# Patient Record
Sex: Female | Born: 1955 | Race: White | Hispanic: No | Marital: Married | Smoking: Former smoker
Health system: Southern US, Community
[De-identification: ages and names within clinical notes are randomized; demographics above are authoritative.]

## PROBLEM LIST (undated history)

## (undated) DIAGNOSIS — C439 Malignant melanoma of skin, unspecified: Secondary | ICD-10-CM

## (undated) DIAGNOSIS — R0600 Dyspnea, unspecified: Secondary | ICD-10-CM

## (undated) DIAGNOSIS — F32A Depression, unspecified: Secondary | ICD-10-CM

## (undated) DIAGNOSIS — L570 Actinic keratosis: Secondary | ICD-10-CM

## (undated) DIAGNOSIS — C4491 Basal cell carcinoma of skin, unspecified: Secondary | ICD-10-CM

## (undated) DIAGNOSIS — M549 Dorsalgia, unspecified: Secondary | ICD-10-CM

## (undated) DIAGNOSIS — M1990 Unspecified osteoarthritis, unspecified site: Secondary | ICD-10-CM

## (undated) DIAGNOSIS — Z9289 Personal history of other medical treatment: Secondary | ICD-10-CM

## (undated) DIAGNOSIS — N390 Urinary tract infection, site not specified: Secondary | ICD-10-CM

## (undated) DIAGNOSIS — I700 Atherosclerosis of aorta: Secondary | ICD-10-CM

## (undated) DIAGNOSIS — R0609 Other forms of dyspnea: Secondary | ICD-10-CM

## (undated) DIAGNOSIS — E785 Hyperlipidemia, unspecified: Secondary | ICD-10-CM

## (undated) DIAGNOSIS — M359 Systemic involvement of connective tissue, unspecified: Secondary | ICD-10-CM

## (undated) DIAGNOSIS — C4492 Squamous cell carcinoma of skin, unspecified: Secondary | ICD-10-CM

## (undated) DIAGNOSIS — I10 Essential (primary) hypertension: Secondary | ICD-10-CM

## (undated) DIAGNOSIS — F329 Major depressive disorder, single episode, unspecified: Secondary | ICD-10-CM

## (undated) HISTORY — DX: Basal cell carcinoma of skin, unspecified: C44.91

## (undated) HISTORY — PX: OOPHORECTOMY: SHX86

## (undated) HISTORY — DX: Squamous cell carcinoma of skin, unspecified: C44.92

## (undated) HISTORY — DX: Other forms of dyspnea: R06.09

## (undated) HISTORY — PX: ABDOMINAL HYSTERECTOMY: SHX81

## (undated) HISTORY — PX: INCONTINENCE SURGERY: SHX676

## (undated) HISTORY — DX: Dyspnea, unspecified: R06.00

## (undated) HISTORY — DX: Atherosclerosis of aorta: I70.0

## (undated) HISTORY — DX: Personal history of other medical treatment: Z92.89

## (undated) HISTORY — DX: Actinic keratosis: L57.0

## (undated) HISTORY — DX: Dorsalgia, unspecified: M54.9

## (undated) HISTORY — DX: Malignant melanoma of skin, unspecified: C43.9

---

## 2016-11-09 DIAGNOSIS — G5601 Carpal tunnel syndrome, right upper limb: Secondary | ICD-10-CM | POA: Insufficient documentation

## 2016-11-22 ENCOUNTER — Emergency Department
Admission: EM | Admit: 2016-11-22 | Discharge: 2016-11-22 | Disposition: A | Discharge status: Home / Self Care | Payer: BLUE CROSS/BLUE SHIELD | Attending: Emergency Medicine | Admitting: Emergency Medicine

## 2016-11-22 DIAGNOSIS — N3090 Cystitis, unspecified without hematuria: Secondary | ICD-10-CM | POA: Diagnosis not present

## 2016-11-22 DIAGNOSIS — Z87891 Personal history of nicotine dependence: Secondary | ICD-10-CM | POA: Insufficient documentation

## 2016-11-22 DIAGNOSIS — N10 Acute tubulo-interstitial nephritis: Secondary | ICD-10-CM | POA: Insufficient documentation

## 2016-11-22 DIAGNOSIS — I10 Essential (primary) hypertension: Secondary | ICD-10-CM | POA: Insufficient documentation

## 2016-11-22 DIAGNOSIS — R319 Hematuria, unspecified: Secondary | ICD-10-CM | POA: Diagnosis present

## 2016-11-22 HISTORY — DX: Unspecified osteoarthritis, unspecified site: M19.90

## 2016-11-22 HISTORY — DX: Major depressive disorder, single episode, unspecified: F32.9

## 2016-11-22 HISTORY — DX: Hyperlipidemia, unspecified: E78.5

## 2016-11-22 HISTORY — DX: Essential (primary) hypertension: I10

## 2016-11-22 HISTORY — DX: Urinary tract infection, site not specified: N39.0

## 2016-11-22 HISTORY — DX: Depression, unspecified: F32.A

## 2016-11-22 LAB — URINALYSIS COMPLETE WITH MICROSCOPIC (ARMC ONLY)
Bacteria, UA: NONE SEEN
Bilirubin Urine: NEGATIVE
Glucose, UA: NEGATIVE mg/dL
KETONES UR: NEGATIVE mg/dL
Nitrite: NEGATIVE
PH: 6 (ref 5.0–8.0)
PROTEIN: 100 mg/dL — AB
SQUAMOUS EPITHELIAL / LPF: NONE SEEN
Specific Gravity, Urine: 1.016 (ref 1.005–1.030)

## 2016-11-22 MED ORDER — CIPROFLOXACIN HCL 500 MG PO TABS: 500.0000 mg | ORAL_TABLET | Freq: Two times a day (BID) | ORAL | 0 refills | Status: AC

## 2016-11-22 MED ORDER — CEPHALEXIN 500 MG PO CAPS
500.0000 mg | ORAL_CAPSULE | Freq: Once | ORAL | Status: AC
  Administered 2016-11-22: 500 mg via ORAL
  Filled 2016-11-22: qty 1

## 2016-11-22 NOTE — ED Triage Notes (Signed)
Pt presents to ED with blood in her urine today. Pt states she was expereincing urinary symptoms that started last Thursday; symptoms seemed to resolve after taking AZO. Pt states today she noticed blood in her urine.

## 2016-11-22 NOTE — Discharge Instructions (Signed)
Take the antibiotic as directed until all pills are gone. Empty your bladder regularly. Follow-up with your provider for continued symptoms or return as needed.

## 2016-11-22 NOTE — ED Provider Notes (Signed)
[facility] Emergency Department Provider Note ____________________________________________  Time seen: 2111  I have reviewed the triage vital signs and the nursing notes.  HISTORY  Chief Complaint  Hematuria  HPI [name] is a 60 y.o. female presents to the ED for evaluation of hematuria and dysuria. Patient describes symptoms of an intermittent for the last week. She has taken Azo on at least 2 occasions with symptom resolution. It was today the patient noted bright red blood on the toilet paper after voiding. She denies any current nausea, vomiting, or chills. She did have a day last week where she experienced those symptoms. Denies any history of kidney stones, but reports urinary tract infections on the order of 2 per year.  Past Medical History:  Diagnosis Date  . Arthritis   . Depression   . Hyperlipemia   . Hypertension   . Recurrent UTI     There are no active problems to display for this patient.   Past Surgical History:  Procedure Laterality Date  . ABDOMINAL HYSTERECTOMY      Prior to Admission medications   Medication Sig Start Date End Date Taking? Authorizing Provider  ciprofloxacin (CIPRO) 500 MG tablet Take 1 tablet (500 mg total) by mouth 2 (two) times daily. 11/22/16 11/29/16  Dannielle Karvonen Rocky Rishel, PA-C    Allergies Patient has no known allergies.  No family history on file.  Social History Social History  Substance Use Topics  . Smoking status: Former Research scientist (life sciences)  . Smokeless tobacco: Never Used  . Alcohol use No    Review of Systems  Constitutional: Negative for fever. Cardiovascular: Negative for chest pain. Respiratory: Negative for shortness of breath. Gastrointestinal: Negative for abdominal pain, vomiting and diarrhea. Genitourinary: Positive for dysuria and hematuria Musculoskeletal: Negative for back pain. Skin: Negative for rash. Neurological: Negative for headaches, focal weakness or  numbness. ____________________________________________  PHYSICAL EXAM:  VITAL SIGNS: ED Triage Vitals  Enc Vitals Group     BP 11/22/16 2104 (!) 149/82     Pulse Rate 11/22/16 2104 66     Resp 11/22/16 2104 16     Temp 11/22/16 2104 97.9 F (36.6 C)     Temp Source 11/22/16 2104 Oral     SpO2 11/22/16 2104 97 %     Weight 11/22/16 2100 180 lb (81.6 kg)     Height 11/22/16 2100 5\' 6"  (1.676 m)     Head Circumference --      Peak Flow --      Pain Score 11/22/16 2100 0     Pain Loc --      Pain Edu? --      Excl. in [place]? --    Constitutional: Alert and oriented. Well appearing and in no distress. Head: Normocephalic and atraumatic. Cardiovascular: Normal rate, regular rhythm. Normal distal pulses. Respiratory: Normal respiratory effort. No wheezes/rales/rhonchi. Gastrointestinal: Soft and nontender. No distention, rebound, guarding, or rigidity. Mild CVA tenderness on the left.  Musculoskeletal: Nontender with normal range of motion in all extremities.  Neurologic:  Normal gait without ataxia. Normal speech and language. No gross focal neurologic deficits are appreciated. Skin:  Skin is warm, dry and intact. No rash noted. ____________________________________________   LABS (pertinent positives/negatives) Labs Reviewed  URINALYSIS COMPLETEWITH MICROSCOPIC ([facility] ONLY) - Abnormal; Notable for the following:       Result Value   Color, Urine YELLOW (*)    APPearance CLOUDY (*)    Hgb urine dipstick 3+ (*)    Protein,  ur 100 (*)    Leukocytes, UA 3+ (*)    All other components within normal limits  URINE CULTURE  ____________________________________________  PROCEDURES  Keflex 500 mg PO ____________________________________________  INITIAL IMPRESSION / ASSESSMENT AND PLAN / ED COURSE  Patient with clinical presentation of acute cystitis. Her urinalysis reveals white blood cell clumps consistent with a uncomplicated pyelonephritis. Patient vital signs are stable and she  is appropriate for outpatient treatment. She'll be discharged with a prescription for Cipro dose as directed for the next 7 days. She'll follow with Dr. Gorden Harms for ongoing symptom management return to the ED for acutely worsening symptoms as discussed.  Clinical Course    ____________________________________________  FINAL CLINICAL IMPRESSION(S) / ED DIAGNOSES  Final diagnoses:  Cystitis  Pyelonephritis, acute      [name], PA-C 11/22/16 2228    Nance Pear, MD 11/22/16 2336

## 2016-11-25 LAB — URINE CULTURE: SPECIAL REQUESTS: NORMAL

## 2016-11-26 NOTE — Progress Notes (Signed)
ED Culture Results   Allergies: NKDA  Visit Date: 11/22/16 Chief Complaint: hematuria Culture Type: Urine  Culture Results: E. coli Original Abx given: Ciprofloxacin  Original Abx sensitive, intermediate, or resistant: sensitive  Patient on appropriate antibiotic.   Loree Fee, PharmD 7:55 PM 11/26/2016

## 2017-08-02 ENCOUNTER — Other Ambulatory Visit: Payer: Self-pay | Admitting: Family Medicine

## 2017-08-02 DIAGNOSIS — Z1231 Encounter for screening mammogram for malignant neoplasm of breast: Secondary | ICD-10-CM

## 2017-08-18 ENCOUNTER — Ambulatory Visit
Admission: RE | Admit: 2017-08-18 | Discharge: 2017-08-18 | Disposition: A | Discharge status: Home / Self Care | Payer: BLUE CROSS/BLUE SHIELD | Source: Ambulatory Visit | Attending: Family Medicine | Admitting: Family Medicine

## 2017-08-18 DIAGNOSIS — Z1231 Encounter for screening mammogram for malignant neoplasm of breast: Secondary | ICD-10-CM | POA: Diagnosis present

## 2017-08-18 DIAGNOSIS — R928 Other abnormal and inconclusive findings on diagnostic imaging of breast: Secondary | ICD-10-CM | POA: Insufficient documentation

## 2017-09-06 ENCOUNTER — Inpatient Hospital Stay
Admission: RE | Admit: 2017-09-06 | Discharge: 2017-09-06 | Disposition: A | Discharge status: Home / Self Care | Payer: Self-pay | Source: Ambulatory Visit | Attending: *Deleted | Admitting: *Deleted

## 2017-09-06 ENCOUNTER — Other Ambulatory Visit: Payer: Self-pay | Admitting: *Deleted

## 2017-09-06 DIAGNOSIS — Z9289 Personal history of other medical treatment: Secondary | ICD-10-CM

## 2017-09-07 ENCOUNTER — Other Ambulatory Visit: Payer: Self-pay | Admitting: Family Medicine

## 2017-09-07 DIAGNOSIS — R928 Other abnormal and inconclusive findings on diagnostic imaging of breast: Secondary | ICD-10-CM

## 2017-09-07 DIAGNOSIS — N6489 Other specified disorders of breast: Secondary | ICD-10-CM

## 2017-09-14 ENCOUNTER — Ambulatory Visit
Admission: RE | Admit: 2017-09-14 | Discharge: 2017-09-14 | Disposition: A | Discharge status: Home / Self Care | Payer: BLUE CROSS/BLUE SHIELD | Source: Ambulatory Visit | Attending: Family Medicine | Admitting: Family Medicine

## 2017-09-14 DIAGNOSIS — N6489 Other specified disorders of breast: Secondary | ICD-10-CM | POA: Diagnosis present

## 2017-09-14 DIAGNOSIS — R928 Other abnormal and inconclusive findings on diagnostic imaging of breast: Secondary | ICD-10-CM | POA: Diagnosis present

## 2018-06-12 ENCOUNTER — Encounter: Payer: Self-pay | Admitting: Urology

## 2018-06-12 ENCOUNTER — Ambulatory Visit: Payer: BLUE CROSS/BLUE SHIELD | Admitting: Urology

## 2018-06-12 VITALS — BP 152/75 | HR 65 | Ht 66.0 in | Wt 188.0 lb

## 2018-06-12 DIAGNOSIS — R3129 Other microscopic hematuria: Secondary | ICD-10-CM

## 2018-06-12 MED ORDER — CIPROFLOXACIN HCL 500 MG PO TABS: 500.0000 mg | ORAL_TABLET | Freq: Two times a day (BID) | ORAL | 0 refills | Status: DC

## 2018-06-12 NOTE — Progress Notes (Unsigned)
06/12/2018 3:51 PM   Michelle Cisneros [birth year] [id number]  Referring provider: Maryland Pink, MD [address] [place], [place] [address]  Chief Complaint  Patient presents with  . Urinary Frequency    HPI: Was consulted to assess the patient recurrent bladder infections.  She said 3 since January.  She gets dysuria and frequency and sometimes blood in the urine.  She may have had a kidney infection in the past.  The symptoms respond favorably to antibiotics.  She has had a previous sling.  She has a smoking history.  She does not take daily aspirin or blood thinners.  She has had a mesh sling  At baseline she leaks with coughing sneezing bending and lifting.  She has urge incontinence.  She denies bedwetting.  She wears 4 5 pads a day sometimes damp but sometimes very wet especially if walking or more active.  She voids once at night or less and every 2 hours during the day  She has had a hysterectomy.  She has no neurologic issues.  Bowel function is normal.  She has not had a kidney stone.  Modifying factors: There are no other modifying factors  Associated signs and symptoms: There are no other associated signs and symptoms Aggravating and relieving factors: There are no other aggravating or relieving factors Severity: Moderate Duration: Persistent   PMH: Past Medical History:  Diagnosis Date  . Arthritis   . Depression   . Hyperlipemia   . Hypertension   . Recurrent UTI     Surgical History: Past Surgical History:  Procedure Laterality Date  . ABDOMINAL HYSTERECTOMY      Home Medications:  Allergies as of 06/12/2018   No Known Allergies     Medication List        Accurate as of 06/12/18  3:51 PM. Always use your most recent med list.          atorvastatin 10 MG tablet Commonly known as:  LIPITOR TAKE ONE TABLET BY MOUTH DAILY   buPROPion 300 MG 24 hr tablet Commonly known as:  WELLBUTRIN XL TAKE ONE TABLET BY MOUTH DAILY   Calcium Carbonate-Vitamin D 600-400 MG-UNIT tablet Take by mouth.   lisinopril 10 MG tablet Commonly known as:  PRINIVIL,ZESTRIL Take by mouth.       Allergies: No Known Allergies  Family History: Family History  Problem Relation Age of Onset  . Breast cancer Neg Hx   . Bladder Cancer Neg Hx   . Kidney cancer Neg Hx     Social History:  reports that she has quit smoking. She has never used smokeless tobacco. She reports that she does not drink alcohol. Her drug history is not on file.  ROS: UROLOGY Frequent Urination?: Yes Hard to postpone urination?: Yes Burning/pain with urination?: No Get up at night to urinate?: Yes Leakage of urine?: Yes Urine stream starts and stops?: No Trouble starting stream?: No Do you have to strain to urinate?: No Blood in urine?: Yes Urinary tract infection?: Yes Sexually transmitted disease?: No Injury to kidneys or bladder?: No Painful intercourse?: No Weak stream?: No Currently pregnant?: No Vaginal bleeding?: No Last menstrual period?: n  Gastrointestinal Nausea?: No Vomiting?: No Indigestion/heartburn?: No Diarrhea?: No Constipation?: No  Constitutional Fever: No Night sweats?: No Weight loss?: No Fatigue?: No  Skin Skin rash/lesions?: No Itching?: No  Eyes Blurred vision?: No Double vision?: No  Ears/Nose/Throat Sore throat?: No Sinus problems?: No  Hematologic/Lymphatic Swollen glands?: No Easy bruising?:  No  Cardiovascular Leg swelling?: No Chest pain?: No  Respiratory Cough?: No Shortness of breath?: No  Endocrine Excessive thirst?: No  Musculoskeletal Back pain?: Yes Joint pain?: Yes  Neurological Headaches?: No Dizziness?: No  Psychologic Depression?: Yes Anxiety?: No  Physical Exam: BP (!) 152/75   Pulse 65   Ht 5\' 6"  (1.676 m)   Wt 188 lb (85.3 kg)   BMI 30.34 kg/m   Constitutional:  Alert and oriented, No acute distress. HEENT: [place] AT, moist mucus membranes.  Trachea  midline, no masses. Cardiovascular: No clubbing, cyanosis, or edema. Respiratory: Normal respiratory effort, no increased work of breathing. GI: Abdomen is soft, nontender, nondistended, no abdominal masses GU: No bladder or CVA tenderness Skin: No rashes, bruises or suspicious lesions. Lymph: No cervical or inguinal adenopathy. Neurologic: Grossly intact, no focal deficits, moving all 4 extremities. Psychiatric: Normal mood and affect.  Laboratory Data: No results found for: WBC, HGB, HCT, MCV, PLT  No results found for: CREATININE  No results found for: PSA  No results found for: TESTOSTERONE  No results found for: HGBA1C  Urinalysis    Component Value Date/Time   COLORURINE YELLOW (A) 11/22/2016 2101   APPEARANCEUR CLOUDY (A) 11/22/2016 2101   LABSPEC 1.016 11/22/2016 2101   PHURINE 6.0 11/22/2016 2101   GLUCOSEU NEGATIVE 11/22/2016 2101   HGBUR 3+ (A) 11/22/2016 2101   BILIRUBINUR NEGATIVE 11/22/2016 2101   [place] NEGATIVE 11/22/2016 2101   PROTEINUR 100 (A) 11/22/2016 2101   NITRITE NEGATIVE 11/22/2016 2101   LEUKOCYTESUR 3+ (A) 11/22/2016 2101    Pertinent Imaging: No x-ray  Assessment & Plan: The patient was referred for recurrent urinary tract infections and what appears to be microscopic hematuria.  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  She does have a smoking history.  Pathophysiology and work-up of chronic cystitis and hematuria discussed.  I mentioned that in the future if we can stop her infections her incontinence may down regulate and we can set up goals then.  Today she had bacteria and microscopic hematuria and urine sent for culture.  Return with CT scan and for pelvic examination and cystoscopy.  Likely start prophylaxis then.  She was called in ciprofloxacin since she thinks she is infected today  1. Microscopic hematuria 2.  Mixed urinary incontinence - Urinalysis, Complete   No follow-ups on file.  Reece Packer,  MD  [facility] [address] [place], [place] [address] [phone]

## 2018-06-13 LAB — MICROSCOPIC EXAMINATION

## 2018-06-13 LAB — URINALYSIS, COMPLETE
BILIRUBIN UA: NEGATIVE
GLUCOSE, UA: NEGATIVE
KETONES UA: NEGATIVE
Leukocytes, UA: NEGATIVE
Nitrite, UA: NEGATIVE
PROTEIN UA: NEGATIVE
SPEC GRAV UA: 1.02 (ref 1.005–1.030)
Urobilinogen, Ur: 0.2 mg/dL (ref 0.2–1.0)
pH, UA: 6 (ref 5.0–7.5)

## 2018-06-14 LAB — CULTURE, URINE COMPREHENSIVE

## 2018-07-11 ENCOUNTER — Ambulatory Visit
Admission: RE | Admit: 2018-07-11 | Discharge: 2018-07-11 | Disposition: A | Discharge status: Home / Self Care | Payer: BLUE CROSS/BLUE SHIELD | Source: Ambulatory Visit | Attending: Urology | Admitting: Urology

## 2018-07-11 DIAGNOSIS — R3129 Other microscopic hematuria: Secondary | ICD-10-CM | POA: Insufficient documentation

## 2018-07-11 HISTORY — DX: Systemic involvement of connective tissue, unspecified: M35.9

## 2018-07-11 LAB — POCT I-STAT CREATININE: Creatinine, Ser: 0.7 mg/dL (ref 0.44–1.00)

## 2018-07-11 MED ORDER — IOPAMIDOL (ISOVUE-300) INJECTION 61%
125.0000 mL | Freq: Once | INTRAVENOUS | Status: AC | PRN
  Administered 2018-07-11: 125 mL via INTRAVENOUS

## 2018-07-17 ENCOUNTER — Ambulatory Visit: Payer: BLUE CROSS/BLUE SHIELD | Admitting: Urology

## 2018-07-17 ENCOUNTER — Encounter: Payer: Self-pay | Admitting: Urology

## 2018-07-17 VITALS — BP 156/90 | HR 65 | Ht 66.0 in | Wt 188.0 lb

## 2018-07-17 DIAGNOSIS — N3946 Mixed incontinence: Secondary | ICD-10-CM

## 2018-07-17 DIAGNOSIS — R3129 Other microscopic hematuria: Secondary | ICD-10-CM

## 2018-07-17 LAB — MICROSCOPIC EXAMINATION
Epithelial Cells (non renal): NONE SEEN /hpf (ref 0–10)
WBC, UA: NONE SEEN /hpf (ref 0–5)

## 2018-07-17 LAB — URINALYSIS, COMPLETE
BILIRUBIN UA: NEGATIVE
GLUCOSE, UA: NEGATIVE
KETONES UA: NEGATIVE
Leukocytes, UA: NEGATIVE
NITRITE UA: NEGATIVE
Protein, UA: NEGATIVE
SPEC GRAV UA: 1.02 (ref 1.005–1.030)
UUROB: 0.2 mg/dL (ref 0.2–1.0)
pH, UA: 7 (ref 5.0–7.5)

## 2018-07-17 MED ORDER — LIDOCAINE HCL URETHRAL/MUCOSAL 2 % EX GEL: 1.0000 "application " | Freq: Once | CUTANEOUS | Status: DC

## 2018-07-17 MED ORDER — TRIMETHOPRIM 100 MG PO TABS: 100.0000 mg | ORAL_TABLET | Freq: Every day | ORAL | 11 refills | Status: DC

## 2018-07-17 MED ORDER — CIPROFLOXACIN HCL 500 MG PO TABS: 500.0000 mg | ORAL_TABLET | Freq: Once | ORAL | Status: DC

## 2018-07-17 NOTE — Progress Notes (Signed)
07/17/2018 9:58 AM   Michelle Cisneros 26-Sep-1956 [id number]  Referring provider: Maryland Pink, MD [address] [place], [place] [address]  Chief Complaint  Patient presents with  . Cysto    HPI: Was consulted to assess the patient recurrent bladder infections.  She said 3 since January.  She gets dysuria and frequency and sometimes blood in the urine.  She may have had a kidney infection in the past.  The symptoms respond favorably to antibiotics.  She has had a previous mesh sling.  She has a smoking history.  She does not take daily aspirin or blood thinners.    At baseline she leaks with coughing sneezing bending and lifting.  She has urge incontinence.  She denies bedwetting.  She wears 4- 5 pads a day sometimes damp but sometimes very wet especially if walking or more active.  She voids once at night or less and every 2 hours during the day   The patient was referred for recurrent urinary tract infections and what appears to be microscopic hematuria.  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  She does have a smoking history.  Pathophysiology and work-up of chronic cystitis and hematuria discussed.  I mentioned that in the future if we can stop her infections her incontinence may down regulate and we can set up goals then.    Today Frequency stable.  Last urine culture negative.  CT scan within normal limits.  She might have a mild right ureteropelvic junction issue or just an extrarenal pelvis.  Contrast excretion and uptake was similar in both kidneys  Clinically not infected today.  No more pain with urination  Pelvic examination: Small grade 2 cystocele with mild central defect and no rectocele.  Grade 1 hypermobility of the bladder neck and no stress incontinence with a light cough.  Moderate vaginal atrophy  Cystoscopy: After verbal and written consent patient underwent sterile cystoscopy.  Flexible cystoscope was utilized.  Bladder  mucosa and trigone were normal.  There is no cystitis.  There is no carcinoma.  Urethra was normal     PMH: Past Medical History:  Diagnosis Date  . Arthritis   . Collagen vascular disease ([place])   . Depression   . Hyperlipemia   . Hypertension   . Recurrent UTI     Surgical History: Past Surgical History:  Procedure Laterality Date  . ABDOMINAL HYSTERECTOMY      Home Medications:  Allergies as of 07/17/2018   No Known Allergies     Medication List        Accurate as of 07/17/18  9:58 AM. Always use your most recent med list.          atorvastatin 10 MG tablet Commonly known as:  LIPITOR TAKE ONE TABLET BY MOUTH DAILY   buPROPion 300 MG 24 hr tablet Commonly known as:  WELLBUTRIN XL TAKE ONE TABLET BY MOUTH DAILY   Calcium Carbonate-Vitamin D 600-400 MG-UNIT tablet Take by mouth.   lisinopril 10 MG tablet Commonly known as:  PRINIVIL,ZESTRIL Take by mouth.       Allergies: No Known Allergies  Family History: Family History  Problem Relation Age of Onset  . Breast cancer Neg Hx   . Bladder Cancer Neg Hx   . Kidney cancer Neg Hx     Social History:  reports that she has quit smoking. She has never used smokeless tobacco. She reports that she does not drink alcohol. Her drug history is  not on file.  ROS:                                        Physical Exam: BP (!) 156/90 (BP Location: Left Arm, Patient Position: Sitting, Cuff Size: Normal)   Pulse 65   Ht 5\' 6"  (1.676 m)   Wt 188 lb (85.3 kg)   BMI 30.34 kg/m   Constitutional:  Alert and oriented, No acute distress.  Laboratory Data: No results found for: WBC, HGB, HCT, MCV, PLT  Lab Results  Component Value Date   CREATININE 0.70 07/11/2018    No results found for: PSA  No results found for: TESTOSTERONE  No results found for: HGBA1C  Urinalysis    Component Value Date/Time   COLORURINE YELLOW (A) 11/22/2016 2101   APPEARANCEUR Clear 06/12/2018 1513    LABSPEC 1.016 11/22/2016 2101   PHURINE 6.0 11/22/2016 2101   GLUCOSEU Negative 06/12/2018 1513   HGBUR 3+ (A) 11/22/2016 2101   BILIRUBINUR Negative 06/12/2018 1513   KETONESUR NEGATIVE 11/22/2016 2101   PROTEINUR Negative 06/12/2018 1513   PROTEINUR 100 (A) 11/22/2016 2101   NITRITE Negative 06/12/2018 1513   NITRITE NEGATIVE 11/22/2016 2101   LEUKOCYTESUR Negative 06/12/2018 1513    Pertinent Imaging:   Assessment & Plan: Reassess in 8 weeks on daily trimethoprim.  Reassess incontinence and treatment goals  1. Microscopic hematuria  - Urinalysis, Complete - ciprofloxacin (CIPRO) tablet 500 mg - lidocaine (XYLOCAINE) 2 % jelly 1 application   Return in about 8 weeks (around 09/11/2018).  Reece Packer, MD  [facility] [address] [place], [place] [address] [phone]

## 2018-08-29 ENCOUNTER — Other Ambulatory Visit: Payer: Self-pay | Admitting: Family Medicine

## 2018-08-30 ENCOUNTER — Other Ambulatory Visit: Payer: Self-pay | Admitting: Family Medicine

## 2018-08-30 DIAGNOSIS — Z1231 Encounter for screening mammogram for malignant neoplasm of breast: Secondary | ICD-10-CM

## 2018-09-11 ENCOUNTER — Ambulatory Visit (INDEPENDENT_AMBULATORY_CARE_PROVIDER_SITE_OTHER): Payer: BLUE CROSS/BLUE SHIELD | Admitting: Urology

## 2018-09-11 ENCOUNTER — Encounter: Payer: Self-pay | Admitting: Urology

## 2018-09-11 VITALS — BP 121/72 | HR 73 | Ht 66.0 in | Wt 190.0 lb

## 2018-09-11 DIAGNOSIS — N3946 Mixed incontinence: Secondary | ICD-10-CM

## 2018-09-11 NOTE — Progress Notes (Signed)
09/11/2018 9:28 AM   Bobette Mo May 26, 1956 [id number]  Referring provider: Maryland Pink, MD [address] [place], [place] [address]  Chief Complaint  Patient presents with  . Follow-up    8wk    HPI: Was consulted to assess the patient recurrent bladder infections. She has had a previous mesh sling.   At baseline she leaks with coughing sneezing bending and lifting. She has urge incontinence. She denies bedwetting. She wears 4- 5 pads a day sometimes damp but sometimes very wet especially if walking or more active. She voids once at night or less and every 2 hours during the day   The patient was referred for recurrent urinary tract infections and what appears to be microscopic hematuria. 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   CT scan within normal limits.  She might have a mild right ureteropelvic junction issue or just an extrarenal pelvis.  Contrast excretion and uptake was similar in both kidneys  Pelvic examination: Small grade 2 cystocele with mild central defect and no rectocele.  Grade 1 hypermobility of the bladder neck and no stress incontinence with a light cough.  Moderate vaginal atrophy  Cystoscopy: normal  Today Patient has been on trimethoprim for 8 weeks urine frequency is stable I believe the patient has not had a bladder infection but she only took it for 1 month because I think she thought it was going to help her incontinence.  She has had at least one bladder operation.  We talked about incontinence and bladder overactivity and stress incontinence.  She is somewhat willing to live with the problem and does not want to go through a lot of cost and tests but then again does not necessarily like to live with the problem  The trimethoprim may have made the incontinence or flow worse and I educated her about this     PMH: Past Medical History:  Diagnosis Date  . Arthritis   . Collagen vascular disease  ([place])   . Depression   . Hyperlipemia   . Hypertension   . Recurrent UTI     Surgical History: Past Surgical History:  Procedure Laterality Date  . ABDOMINAL HYSTERECTOMY      Home Medications:  Allergies as of 09/11/2018   No Known Allergies     Medication List        Accurate as of 09/11/18  9:28 AM. Always use your most recent med list.          atorvastatin 10 MG tablet Commonly known as:  LIPITOR TAKE ONE TABLET BY MOUTH DAILY   buPROPion 300 MG 24 hr tablet Commonly known as:  WELLBUTRIN XL TAKE ONE TABLET BY MOUTH DAILY   Calcium Carbonate-Vitamin D 600-400 MG-UNIT tablet Take by mouth.   lisinopril 10 MG tablet Commonly known as:  PRINIVIL,ZESTRIL Take by mouth.   trimethoprim 100 MG tablet Commonly known as:  TRIMPEX Take 1 tablet (100 mg total) by mouth daily.       Allergies: No Known Allergies  Family History: Family History  Problem Relation Age of Onset  . Breast cancer Neg Hx   . Bladder Cancer Neg Hx   . Kidney cancer Neg Hx     Social History:  reports that she has quit smoking. She has never used smokeless tobacco. She reports that she does not drink alcohol. Her drug history is not on file.  ROS: UROLOGY Frequent Urination?: Yes Hard to postpone urination?: Yes Burning/pain with  urination?: No Get up at night to urinate?: No Leakage of urine?: No Urine stream starts and stops?: No Trouble starting stream?: No Do you have to strain to urinate?: No Blood in urine?: No Urinary tract infection?: No Sexually transmitted disease?: No Injury to kidneys or bladder?: No Painful intercourse?: No Weak stream?: No Currently pregnant?: No Vaginal bleeding?: No Last menstrual period?: n  Gastrointestinal Nausea?: No Vomiting?: No Indigestion/heartburn?: No Diarrhea?: No Constipation?: No  Constitutional Fever: No Night sweats?: No Weight loss?: No Fatigue?: No  Skin Skin rash/lesions?: No Itching?: No  Eyes Blurred  vision?: No Double vision?: No  Ears/Nose/Throat Sore throat?: No Sinus problems?: No  Hematologic/Lymphatic Swollen glands?: No Easy bruising?: No  Cardiovascular Leg swelling?: No Chest pain?: No  Respiratory Cough?: No Shortness of breath?: No  Endocrine Excessive thirst?: No  Musculoskeletal Back pain?: Yes Joint pain?: Yes  Neurological Headaches?: No Dizziness?: No  Psychologic Depression?: Yes Anxiety?: No  Physical Exam: BP 121/72   Pulse 73   Ht 5\' 6"  (1.676 m)   Wt 86.2 kg   BMI 30.67 kg/m   Constitutional:  Alert and oriented, No acute distress.  Laboratory Data: No results found for: WBC, HGB, HCT, MCV, PLT  Lab Results  Component Value Date   CREATININE 0.70 07/11/2018    No results found for: PSA  No results found for: TESTOSTERONE  No results found for: HGBA1C  Urinalysis    Component Value Date/Time   COLORURINE YELLOW (A) 11/22/2016 2101   APPEARANCEUR Clear 07/17/2018 0945   LABSPEC 1.016 11/22/2016 2101   PHURINE 6.0 11/22/2016 2101   GLUCOSEU Negative 07/17/2018 0945   HGBUR 3+ (A) 11/22/2016 2101   BILIRUBINUR Negative 07/17/2018 0945   KETONESUR NEGATIVE 11/22/2016 2101   PROTEINUR Negative 07/17/2018 0945   PROTEINUR 100 (A) 11/22/2016 2101   NITRITE Negative 07/17/2018 0945   NITRITE NEGATIVE 11/22/2016 2101   LEUKOCYTESUR Negative 07/17/2018 0945    Pertinent Imaging:   Assessment & Plan: Patient was given 7 weeks of Myrbetriq to see if it helps her flow symptoms and mixed incontinence.  She will go back on trimethoprim.  We will regroup in 6 weeks and reassess treatment goals.  Clinically not infected today  There are no diagnoses linked to this encounter.  No follow-ups on file.  Reece Packer, MD  [facility] [address] [place], [place] [address] [phone]

## 2018-09-12 ENCOUNTER — Ambulatory Visit
Admission: RE | Admit: 2018-09-12 | Discharge: 2018-09-12 | Disposition: A | Discharge status: Home / Self Care | Payer: BLUE CROSS/BLUE SHIELD | Source: Ambulatory Visit | Attending: Family Medicine | Admitting: Family Medicine

## 2018-09-12 DIAGNOSIS — Z1231 Encounter for screening mammogram for malignant neoplasm of breast: Secondary | ICD-10-CM | POA: Insufficient documentation

## 2018-10-03 ENCOUNTER — Encounter: Payer: Self-pay | Admitting: Family Medicine

## 2018-10-03 ENCOUNTER — Ambulatory Visit (INDEPENDENT_AMBULATORY_CARE_PROVIDER_SITE_OTHER): Payer: BLUE CROSS/BLUE SHIELD | Admitting: Family Medicine

## 2018-10-03 DIAGNOSIS — Z1151 Encounter for screening for human papillomavirus (HPV): Secondary | ICD-10-CM

## 2018-10-03 DIAGNOSIS — Z124 Encounter for screening for malignant neoplasm of cervix: Secondary | ICD-10-CM

## 2018-10-03 DIAGNOSIS — Z23 Encounter for immunization: Secondary | ICD-10-CM | POA: Diagnosis not present

## 2018-10-03 DIAGNOSIS — Z01419 Encounter for gynecological examination (general) (routine) without abnormal findings: Secondary | ICD-10-CM | POA: Diagnosis not present

## 2018-10-03 NOTE — Patient Instructions (Signed)
Preventive Care 40-64 Years, Female Preventive care refers to lifestyle choices and visits with your health care provider that can promote health and wellness. What does preventive care include?  A yearly physical exam. This is also called an annual well check.  Dental exams once or twice a year.  Routine eye exams. Ask your health care provider how often you should have your eyes checked.  Personal lifestyle choices, including: ? Daily care of your teeth and gums. ? Regular physical activity. ? Eating a healthy diet. ? Avoiding tobacco and drug use. ? Limiting alcohol use. ? Practicing safe sex. ? Taking low-dose aspirin daily starting at age 58. ? Taking vitamin and mineral supplements as recommended by your health care provider. What happens during an annual well check? The services and screenings done by your health care provider during your annual well check will depend on your age, overall health, lifestyle risk factors, and family history of disease. Counseling Your health care provider may ask you questions about your:  Alcohol use.  Tobacco use.  Drug use.  Emotional well-being.  Home and relationship well-being.  Sexual activity.  Eating habits.  Work and work Statistician.  Method of birth control.  Menstrual cycle.  Pregnancy history.  Screening You may have the following tests or measurements:  Height, weight, and BMI.  Blood pressure.  Lipid and cholesterol levels. These may be checked every 5 years, or more frequently if you are over 81 years old.  Skin check.  Lung cancer screening. You may have this screening every year starting at age 78 if you have a 30-pack-year history of smoking and currently smoke or have quit within the past 15 years.  Fecal occult blood test (FOBT) of the stool. You may have this test every year starting at age 65.  Flexible sigmoidoscopy or colonoscopy. You may have a sigmoidoscopy every 5 years or a colonoscopy  every 10 years starting at age 30.  Hepatitis C blood test.  Hepatitis B blood test.  Sexually transmitted disease (STD) testing.  Diabetes screening. This is done by checking your blood sugar (glucose) after you have not eaten for a while (fasting). You may have this done every 1-3 years.  Mammogram. This may be done every 1-2 years. Talk to your health care provider about when you should start having regular mammograms. This may depend on whether you have a family history of breast cancer.  BRCA-related cancer screening. This may be done if you have a family history of breast, ovarian, tubal, or peritoneal cancers.  Pelvic exam and Pap test. This may be done every 3 years starting at age 80. Starting at age 36, this may be done every 5 years if you have a Pap test in combination with an HPV test.  Bone density scan. This is done to screen for osteoporosis. You may have this scan if you are at high risk for osteoporosis.  Discuss your test results, treatment options, and if necessary, the need for more tests with your health care provider. Vaccines Your health care provider may recommend certain vaccines, such as:  Influenza vaccine. This is recommended every year.  Tetanus, diphtheria, and acellular pertussis (Tdap, Td) vaccine. You may need a Td booster every 10 years.  Varicella vaccine. You may need this if you have not been vaccinated.  Zoster vaccine. You may need this after age 5.  Measles, mumps, and rubella (MMR) vaccine. You may need at least one dose of MMR if you were born in  1957 or later. You may also need a second dose.  Pneumococcal 13-valent conjugate (PCV13) vaccine. You may need this if you have certain conditions and were not previously vaccinated.  Pneumococcal polysaccharide (PPSV23) vaccine. You may need one or two doses if you smoke cigarettes or if you have certain conditions.  Meningococcal vaccine. You may need this if you have certain  conditions.  Hepatitis A vaccine. You may need this if you have certain conditions or if you travel or work in places where you may be exposed to hepatitis A.  Hepatitis B vaccine. You may need this if you have certain conditions or if you travel or work in places where you may be exposed to hepatitis B.  Haemophilus influenzae type b (Hib) vaccine. You may need this if you have certain conditions.  Talk to your health care provider about which screenings and vaccines you need and how often you need them. This information is not intended to replace advice given to you by your health care provider. Make sure you discuss any questions you have with your health care provider. Document Released: 01/02/2016 Document Revised: 08/25/2016 Document Reviewed: 10/07/2015 Elsevier Interactive Patient Education  2018 Elsevier Inc.  

## 2018-10-03 NOTE — Progress Notes (Signed)
  Subjective:     Michelle Cisneros is a 62 y.o. female and is here for a comprehensive physical exam. The patient reports no problems. Moved down from [place], to be with her grandkids. Retired from being a Educational psychologist. Husband works for [employer]. S/p TAH and BSO for dropped bladder. 2nd surgery for this, still with some issues. She reports taking low dose antibiotic for recurrent UTI.  The following portions of the patient's history were reviewed and updated as appropriate: allergies, current medications, past family history, past medical history, past social history, past surgical history and problem list.  Review of Systems Pertinent items noted in HPI and remainder of comprehensive ROS otherwise negative.   Objective:    BP 96/70   Pulse 72   Wt 191 lb 3.2 oz (86.7 kg)   BMI 30.86 kg/m  General appearance: alert, cooperative and appears stated age Head: Normocephalic, without obvious abnormality, atraumatic Neck: no adenopathy, supple, symmetrical, trachea midline and thyroid not enlarged, symmetric, no tenderness/mass/nodules Lungs: clear to auscultation bilaterally Breasts: normal appearance, no masses or tenderness Heart: regular rate and rhythm, S1, S2 normal, no murmur, click, rub or gallop Abdomen: soft, non-tender; bowel sounds normal; no masses,  no organomegaly Pelvic: external genitalia normal, no adnexal masses or tenderness, uterus surgically absent, vagina normal without discharge and vaginal atrophy, normal cuff Extremities: extremities normal, atraumatic, no cyanosis or edema Pulses: 2+ and symmetric Skin: Skin color, texture, turgor normal. No rashes or lesions Lymph nodes: Cervical, supraclavicular, and axillary nodes normal. Neurologic: Grossly normal    Assessment:    GYN female exam.      Plan:      Screening for malignant neoplasm of cervix - Plan: Cytology - PAP  Encounter for gynecological examination without abnormal finding - Plan: Cytology -  PAP  Should not need further pap smears, with absent uterus and cervix and age.  Flu shot today  Return in 1 year (on 10/04/2019).  See After Visit Summary for Counseling Recommendations

## 2018-10-09 LAB — CYTOLOGY - PAP
Diagnosis: NEGATIVE
HPV: NOT DETECTED

## 2018-10-23 ENCOUNTER — Ambulatory Visit: Payer: BLUE CROSS/BLUE SHIELD | Admitting: Urology

## 2018-10-24 ENCOUNTER — Encounter: Payer: Self-pay | Admitting: Urology

## 2018-12-04 ENCOUNTER — Ambulatory Visit: Payer: BLUE CROSS/BLUE SHIELD | Admitting: Urology

## 2018-12-04 ENCOUNTER — Encounter: Payer: Self-pay | Admitting: Urology

## 2018-12-04 VITALS — BP 118/78 | HR 66 | Ht 66.0 in | Wt 190.7 lb

## 2018-12-04 DIAGNOSIS — N3946 Mixed incontinence: Secondary | ICD-10-CM | POA: Diagnosis not present

## 2018-12-04 MED ORDER — TRIMETHOPRIM 100 MG PO TABS: 100.0000 mg | ORAL_TABLET | Freq: Every day | ORAL | 3 refills | Status: DC

## 2018-12-04 MED ORDER — MIRABEGRON ER 50 MG PO TB24: 50.0000 mg | ORAL_TABLET | Freq: Every day | ORAL | 11 refills | Status: DC

## 2018-12-04 NOTE — Progress Notes (Signed)
12/04/2018 2:41 PM   Michelle Cisneros 12-20-1956 [id number]  Referring provider: Maryland Pink, MD [address] [place], [place] [address]  Chief Complaint  Patient presents with  . Urinary Incontinence    HPI: Was consulted to assess the patient recurrent bladder infections. She has had a previousmeshsling.   At baseline she leaks with coughing sneezing bending and lifting. She has urge incontinence. She denies bedwetting. She wears 4-5 pads a day sometimes damp but sometimes very wet especially if walking or more active. She voids once at night or less and every 2 hours during the day  The patient was referred for recurrent urinary tract infections and what appears to be microscopic hematuria. 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   CT scan within normal limits. She might have a mild right ureteropelvic junction issue or just an extrarenal pelvis. Contrast excretion and uptake was similar in both kidneys  Pelvic examination: Small grade 2 cystocele with mild central defect and no rectocele. Grade 1 hypermobility of the bladder neck and no stress incontinence with a light cough. Moderate vaginal atrophy  Cystoscopy: normal  I believe the patient has not had a bladder infection but she only took it for 1 month because I think she thought it was going to help her incontinence.  She has had at least one bladder operation.  We talked about incontinence and bladder overactivity and stress incontinence.  She is somewhat willing to live with the problem and does not want to go through a lot of cost and tests but then again does not necessarily like to live with the problem  The trimethoprim may have made the incontinence or flow worse and I educated her about this   Patient was given 7 weeks of Myrbetriq to see if it helps her flow symptoms and mixed incontinence.  She will go back on trimethoprim.  We will regroup in 6 weeks and  reassess treatment goals.  Clinically not infected today  Today Clinically infection free on trimethoprim and I hand-delivered a prescription of 903.  The Myrbetriq helped a lot and I gave her 2 months of samples and a prescription because her husband just lost her job but he is she is getting Medicare in January    PMH: Past Medical History:  Diagnosis Date  . Arthritis   . Collagen vascular disease ([place])   . Depression   . Hyperlipemia   . Hypertension   . Recurrent UTI     Surgical History: Past Surgical History:  Procedure Laterality Date  . ABDOMINAL HYSTERECTOMY    . OOPHORECTOMY      Home Medications:  Allergies as of 12/04/2018   No Known Allergies     Medication List       Accurate as of December 04, 2018  2:41 PM. Always use your most recent med list.        atorvastatin 10 MG tablet Commonly known as:  LIPITOR TAKE ONE TABLET BY MOUTH DAILY   buPROPion 300 MG 24 hr tablet Commonly known as:  WELLBUTRIN XL TAKE ONE TABLET BY MOUTH DAILY   Calcium Carbonate-Vitamin D 600-400 MG-UNIT tablet Take by mouth.   lisinopril 10 MG tablet Commonly known as:  PRINIVIL,ZESTRIL Take by mouth.   trimethoprim 100 MG tablet Commonly known as:  TRIMPEX Take 1 tablet (100 mg total) by mouth daily.       Allergies: No Known Allergies  Family History: Family History  Problem Relation Age  of Onset  . Cancer Father        laryngeal  . Stroke Mother   . Breast cancer Neg Hx   . Bladder Cancer Neg Hx   . Kidney cancer Neg Hx     Social History:  reports that she quit smoking about 31 years ago. She has never used smokeless tobacco. She reports current alcohol use. She reports previous drug use.  ROS: UROLOGY Frequent Urination?: No Hard to postpone urination?: No Burning/pain with urination?: No Get up at night to urinate?: No Leakage of urine?: Yes Urine stream starts and stops?: No Trouble starting stream?: No Do you have to strain to urinate?:  No Blood in urine?: No Urinary tract infection?: No Sexually transmitted disease?: No Injury to kidneys or bladder?: No Painful intercourse?: No Weak stream?: No Currently pregnant?: No Vaginal bleeding?: No Last menstrual period?: n  Gastrointestinal Nausea?: No Vomiting?: No Indigestion/heartburn?: No Diarrhea?: No Constipation?: No  Constitutional Fever: No Night sweats?: No Weight loss?: No Fatigue?: No  Skin Skin rash/lesions?: No Itching?: No  Eyes Blurred vision?: No Double vision?: No  Ears/Nose/Throat Sore throat?: No Sinus problems?: No  Hematologic/Lymphatic Swollen glands?: No Easy bruising?: No  Cardiovascular Leg swelling?: No Chest pain?: No  Respiratory Cough?: No Shortness of breath?: No  Endocrine Excessive thirst?: No  Musculoskeletal Back pain?: Yes Joint pain?: Yes  Neurological Headaches?: No Dizziness?: No  Psychologic Depression?: No Anxiety?: No  Physical Exam: BP 118/78 (BP Location: Left Arm, Patient Position: Sitting, Cuff Size: Normal)   Pulse 66   Ht 5\' 6"  (1.676 m)   Wt 190 lb 11.2 oz (86.5 kg)   BMI 30.78 kg/m     Laboratory Data: No results found for: WBC, HGB, HCT, MCV, PLT  Lab Results  Component Value Date   CREATININE 0.70 07/11/2018    No results found for: PSA  No results found for: TESTOSTERONE  No results found for: HGBA1C  Urinalysis    Component Value Date/Time   COLORURINE YELLOW (A) 11/22/2016 2101   APPEARANCEUR Clear 07/17/2018 0945   LABSPEC 1.016 11/22/2016 2101   PHURINE 6.0 11/22/2016 2101   GLUCOSEU Negative 07/17/2018 0945   HGBUR 3+ (A) 11/22/2016 2101   BILIRUBINUR Negative 07/17/2018 0945   KETONESUR NEGATIVE 11/22/2016 2101   PROTEINUR Negative 07/17/2018 0945   PROTEINUR 100 (A) 11/22/2016 2101   NITRITE Negative 07/17/2018 0945   NITRITE NEGATIVE 11/22/2016 2101   LEUKOCYTESUR Negative 07/17/2018 0945    Pertinent Imaging:   Assessment & Plan: Reassess  in 4 months on trimethoprim and Myrbetriq  There are no diagnoses linked to this encounter.  No follow-ups on file.  Reece Packer, MD  [facility] [address] [place], [place] [address] [phone]

## 2019-03-06 ENCOUNTER — Telehealth: Payer: Self-pay | Admitting: Urology

## 2019-03-06 DIAGNOSIS — N390 Urinary tract infection, site not specified: Secondary | ICD-10-CM

## 2019-03-06 DIAGNOSIS — N3946 Mixed incontinence: Secondary | ICD-10-CM

## 2019-03-06 NOTE — Telephone Encounter (Signed)
Pt called and would like her Trimethoprine 100 mg called into [facility], [phone].

## 2019-03-07 MED ORDER — TRIMETHOPRIM 100 MG PO TABS
100.0000 mg | ORAL_TABLET | Freq: Every day | ORAL | 1 refills | Status: DC
Start: 2019-03-07 — End: 2020-04-24

## 2019-03-07 NOTE — Telephone Encounter (Signed)
RX sent, pharmacy changed.

## 2019-03-07 NOTE — Addendum Note (Signed)
Addended by: Donalee Citrin on: 03/07/2019 04:19 PM   Modules accepted: Orders

## 2019-03-14 ENCOUNTER — Telehealth: Payer: Self-pay | Admitting: Family Medicine

## 2019-03-14 NOTE — Telephone Encounter (Signed)
Patient's pharmacist called stating there is a contraindication for Trimethoprim and Lisinopril. She states it may cause hyperkalemia. She wants to know if you are still wanting to prescribe this medication or change to another medicine?

## 2019-03-15 NOTE — Telephone Encounter (Signed)
No change 

## 2019-03-15 NOTE — Telephone Encounter (Signed)
LMOM notified patient to contact pharmacy and let them know that there is no change on the RX that was sent in. The pharmacy is welcome to contact our office. I tried calling and could not get through.

## 2019-03-16 DIAGNOSIS — Z79899 Other long term (current) drug therapy: Secondary | ICD-10-CM | POA: Diagnosis not present

## 2019-03-19 ENCOUNTER — Telehealth: Payer: Self-pay | Admitting: Urology

## 2019-03-19 NOTE — Telephone Encounter (Signed)
Pt called office to let us know mail order pharmacy [facility]) will not fill RX until they speak with someone from our office.  [phone]

## 2019-03-20 NOTE — Telephone Encounter (Signed)
[place], waited on hold for pharmacist for approx 85mins before line dropped. 1st attempt.

## 2019-03-21 NOTE — Telephone Encounter (Signed)
Spoke with pharmacy and confirmed trimethoprim daily

## 2019-03-22 DIAGNOSIS — M7989 Other specified soft tissue disorders: Secondary | ICD-10-CM | POA: Diagnosis not present

## 2019-03-22 DIAGNOSIS — R05 Cough: Secondary | ICD-10-CM | POA: Diagnosis not present

## 2019-03-22 DIAGNOSIS — J302 Other seasonal allergic rhinitis: Secondary | ICD-10-CM | POA: Diagnosis not present

## 2019-04-09 ENCOUNTER — Ambulatory Visit: Payer: BLUE CROSS/BLUE SHIELD | Admitting: Urology

## 2019-04-10 ENCOUNTER — Other Ambulatory Visit: Payer: Self-pay

## 2019-04-10 ENCOUNTER — Telehealth (INDEPENDENT_AMBULATORY_CARE_PROVIDER_SITE_OTHER): Payer: PPO | Admitting: Urology

## 2019-04-10 DIAGNOSIS — Z87440 Personal history of urinary (tract) infections: Secondary | ICD-10-CM

## 2019-04-10 DIAGNOSIS — N3946 Mixed incontinence: Secondary | ICD-10-CM

## 2019-04-11 NOTE — Progress Notes (Signed)
Virtual Visit via Telephone Note  I connected with Michelle Cisneros on 04/10/2019 at 1236 by audio/visual and verified that I am speaking with the correct person using two identifiers.  They are located at home.  I am located at my home.    This visit type was conducted due to national recommendations for restrictions regarding the COVID-19 Pandemic (e.g. social distancing).  This format is felt to be most appropriate for this patient at this time.  All issues noted in this document were discussed and addressed.  No physical exam was performed.   I discussed the limitations, risks, security and privacy concerns of performing an evaluation and management service by telephone and the availability of in person appointments. I also discussed with the patient that there may be a patient responsible charge related to this service. The patient expressed understanding and agreed to proceed.   History of Present Illness: Michelle Cisneros is a 63 year old Caucasian female with a history of rUTI's and mixed incontinence who is contacted for a 4 month reassessment after a trial of trimethoprim and Myrbetriq.    She did not continue the Myrbetriq as it was cost prohibitive and she did not find it very effective in reaching her treatment goals.  She feels that the onus is on her as she has gained weight and knows that the excess weight is certainly not helping with the incontinence.  She states she is still wearing pads due to the leakage.    She continues the trimethoprim and has not had any symptoms of an UTI.  She has been pleased with this result.       Observations/Objective: Michelle Cisneros is dressed appropriately and does not appear to be distress.    Assessment and Plan:  1. History of rUTI's Continue the Trimethoprim  2. Mixed incontinence Offered an alternative medication, but she deferred Offered a referral PT, but she deferred She would really like the opportunity to have a chance to lose  weight to see if she can improve the incontinence and also better her health.  She is concerned about her ability to exercise as her knees are bothering her.  She is a member of [employer] and found the helpful in the past.  I also mentioned that some folks have had success with [place].  We also talked about exercises that she could do that would be easier on her knees.    Follow Up Instructions:  Michelle Cisneros with put weight loss measures in place and will contact us if she would like to add PT to her treatment regimen.    I discussed the assessment and treatment plan with the patient. The patient was provided an opportunity to ask questions and all were answered. The patient agreed with the plan and demonstrated an understanding of the instructions.   The patient was advised to call back or seek an in-person evaluation if the symptoms worsen or if the condition fails to improve as anticipated.  I provided 16 minutes of face-to-face time during this encounter.   Neldon Shepard, PA-C

## 2019-05-07 DIAGNOSIS — M545 Low back pain: Secondary | ICD-10-CM | POA: Diagnosis not present

## 2019-05-21 DIAGNOSIS — S39012A Strain of muscle, fascia and tendon of lower back, initial encounter: Secondary | ICD-10-CM | POA: Diagnosis not present

## 2019-05-22 DIAGNOSIS — M545 Low back pain: Secondary | ICD-10-CM | POA: Diagnosis not present

## 2019-05-25 DIAGNOSIS — M545 Low back pain: Secondary | ICD-10-CM | POA: Diagnosis not present

## 2019-05-25 DIAGNOSIS — R2681 Unsteadiness on feet: Secondary | ICD-10-CM | POA: Diagnosis not present

## 2019-05-28 DIAGNOSIS — F419 Anxiety disorder, unspecified: Secondary | ICD-10-CM | POA: Diagnosis not present

## 2019-05-28 DIAGNOSIS — M545 Low back pain: Secondary | ICD-10-CM | POA: Diagnosis not present

## 2019-05-28 DIAGNOSIS — R110 Nausea: Secondary | ICD-10-CM | POA: Diagnosis not present

## 2019-05-28 DIAGNOSIS — R509 Fever, unspecified: Secondary | ICD-10-CM | POA: Diagnosis not present

## 2019-05-28 DIAGNOSIS — I10 Essential (primary) hypertension: Secondary | ICD-10-CM | POA: Diagnosis not present

## 2019-06-01 DIAGNOSIS — M545 Low back pain: Secondary | ICD-10-CM | POA: Diagnosis not present

## 2019-06-04 DIAGNOSIS — S39012A Strain of muscle, fascia and tendon of lower back, initial encounter: Secondary | ICD-10-CM | POA: Diagnosis not present

## 2019-06-05 DIAGNOSIS — M545 Low back pain: Secondary | ICD-10-CM | POA: Diagnosis not present

## 2019-06-07 DIAGNOSIS — M4856XA Collapsed vertebra, not elsewhere classified, lumbar region, initial encounter for fracture: Secondary | ICD-10-CM | POA: Diagnosis not present

## 2019-06-08 DIAGNOSIS — M545 Low back pain: Secondary | ICD-10-CM | POA: Diagnosis not present

## 2019-06-18 DIAGNOSIS — M545 Low back pain: Secondary | ICD-10-CM | POA: Diagnosis not present

## 2019-06-20 DIAGNOSIS — J019 Acute sinusitis, unspecified: Secondary | ICD-10-CM | POA: Diagnosis not present

## 2019-06-20 DIAGNOSIS — H66002 Acute suppurative otitis media without spontaneous rupture of ear drum, left ear: Secondary | ICD-10-CM | POA: Diagnosis not present

## 2019-06-20 DIAGNOSIS — B9689 Other specified bacterial agents as the cause of diseases classified elsewhere: Secondary | ICD-10-CM | POA: Diagnosis not present

## 2019-06-21 DIAGNOSIS — M545 Low back pain: Secondary | ICD-10-CM | POA: Diagnosis not present

## 2019-06-28 DIAGNOSIS — M545 Low back pain: Secondary | ICD-10-CM | POA: Diagnosis not present

## 2019-07-02 DIAGNOSIS — M545 Low back pain: Secondary | ICD-10-CM | POA: Diagnosis not present

## 2019-07-05 DIAGNOSIS — M545 Low back pain: Secondary | ICD-10-CM | POA: Diagnosis not present

## 2019-07-11 DIAGNOSIS — M545 Low back pain: Secondary | ICD-10-CM | POA: Diagnosis not present

## 2019-07-23 DIAGNOSIS — H6982 Other specified disorders of Eustachian tube, left ear: Secondary | ICD-10-CM | POA: Diagnosis not present

## 2019-07-23 DIAGNOSIS — H9202 Otalgia, left ear: Secondary | ICD-10-CM | POA: Diagnosis not present

## 2019-07-27 DIAGNOSIS — M545 Low back pain: Secondary | ICD-10-CM | POA: Diagnosis not present

## 2019-07-31 ENCOUNTER — Encounter: Payer: Self-pay | Admitting: Radiology

## 2019-08-03 DIAGNOSIS — M545 Low back pain: Secondary | ICD-10-CM | POA: Diagnosis not present

## 2019-08-06 ENCOUNTER — Telehealth: Payer: Self-pay | Admitting: Urology

## 2019-08-06 NOTE — Telephone Encounter (Signed)
Michelle Cisneros has a history of AMH and she was evaluated by Dr. Matilde Sprang for this one year ago. She also has OAB.  She needs an appointment to recheck her UA for blood and to check on her OAB.

## 2019-08-09 ENCOUNTER — Other Ambulatory Visit: Payer: Self-pay | Admitting: Family Medicine

## 2019-08-09 DIAGNOSIS — M545 Low back pain: Secondary | ICD-10-CM | POA: Diagnosis not present

## 2019-08-09 DIAGNOSIS — Z1231 Encounter for screening mammogram for malignant neoplasm of breast: Secondary | ICD-10-CM

## 2019-08-09 NOTE — Progress Notes (Signed)
08/10/2019 9:47 AM   Michelle Cisneros [birth year] [id number]  Referring provider: Maryland Pink, MD [address] [place],  [place] [address]  Chief Complaint  Patient presents with  . Recurrent UTI    HPI: Michelle Cisneros is a 63 year old female with a history of hematuria, history or rUTI's and mixed urinary incontinence who presents today for follow up.  History of hematuria (high risk) Former smoker.  CTU in 06/2018 revealed a 1.5 cm left adrenal adenoma. Right adrenal gland is normal. Noncontrast images demonstrate no nephrolithiasis.  No ureterolithiasis. Mild right pelviectasis. Kidneys enhance symmetrically with contrast. No suspicious enhancing renal mass identified. Delayed images demonstrate excretion of contrast material into the bilateral renal collecting systems and ureters.  The distal right ureter is not opacified and therefore not well assessed. No abnormal filling defects identified within the opacified portions. Urinary bladder is unremarkable.  Cystoscopy in 06/2018 with Dr. Matilde Sprang was NED.  She does not report gross hematuria.  Her UA negative for AMH.    History of rUTI's Risk factors: age, vaginal atrophy, incontinence and constipation.  She was placed on daily suppressive trimethoprim.   No documented infections over the last year.    Mixed incontinence She found no relief in her incontinence with the Myrbetriq 25 mg.  She had decided to try conservative measures, weight loss ,etc.  The patient is  experiencing urgency x 8 or more, frequency x 8 or more, is restricting fluids to avoid visits to the restroom, is engaging in toilet mapping, incontinence x 8 or more and nocturia x 0-3.   Her BP is 135/87.   Her PVR is 0 mL.    She is complaining of urgency and incontinence.    PMH: Past Medical History:  Diagnosis Date  . Arthritis   . Collagen vascular disease ([place])   . Depression   . Hyperlipemia   . Hypertension   . Recurrent UTI     Surgical History: Past Surgical History:  Procedure Laterality Date  . ABDOMINAL HYSTERECTOMY    . OOPHORECTOMY      Home Medications:  Allergies as of 08/10/2019   No Known Allergies     Medication List       Accurate as of August 10, 2019  9:47 AM. If you have any questions, ask your nurse or doctor.        atorvastatin 10 MG tablet Commonly known as: LIPITOR TAKE ONE TABLET BY MOUTH DAILY   buPROPion 300 MG 24 hr tablet Commonly known as: WELLBUTRIN XL TAKE ONE TABLET BY MOUTH DAILY   Calcium Carbonate-Vitamin D 600-400 MG-UNIT tablet Take by mouth.   lisinopril 10 MG tablet Commonly known as: ZESTRIL Take by mouth.   mirabegron ER 50 MG Tb24 tablet Commonly known as: MYRBETRIQ Take 1 tablet (50 mg total) by mouth daily.   trimethoprim 100 MG tablet Commonly known as: TRIMPEX Take 1 tablet (100 mg total) by mouth daily.       Allergies: No Known Allergies  Family History: Family History  Problem Relation Age of Onset  . Cancer Father        laryngeal  . Stroke Mother   . Breast cancer Neg Hx   . Bladder Cancer Neg Hx   . Kidney cancer Neg Hx     Social History:  reports that she quit smoking about 31 years ago. She has never used smokeless tobacco. She reports current alcohol use. She reports previous drug use.  ROS: UROLOGY Frequent Urination?: No Hard to postpone urination?: Yes Burning/pain with urination?: No Get up at night to urinate?: No Leakage of urine?: Yes Urine stream starts and stops?: No Trouble starting stream?: No Do you have to strain to urinate?: No Blood in urine?: No Urinary tract infection?: No Sexually transmitted disease?: No Injury to kidneys or bladder?: No Painful intercourse?: No Weak stream?: No Currently pregnant?: No Vaginal bleeding?: No Last menstrual period?: n  Gastrointestinal Nausea?: No Vomiting?: No Indigestion/heartburn?: No Diarrhea?: No Constipation?: Yes  Constitutional Fever: No  Night sweats?: No Weight loss?: No Fatigue?: No  Skin Skin rash/lesions?: No Itching?: No  Eyes Blurred vision?: No Double vision?: No  Ears/Nose/Throat Sore throat?: No Sinus problems?: No  Hematologic/Lymphatic Swollen glands?: No Easy bruising?: No  Cardiovascular Leg swelling?: No Chest pain?: No  Respiratory Cough?: No Shortness of breath?: No  Endocrine Excessive thirst?: No  Musculoskeletal Back pain?: No Joint pain?: No  Neurological Headaches?: No Dizziness?: No  Psychologic Depression?: Yes Anxiety?: No  Physical Exam: BP 135/87 (BP Location: Left Arm, Patient Position: Sitting, Cuff Size: Normal)   Pulse 77   Ht 5\' 6"  (1.676 m)   Wt 196 lb (88.9 kg)   BMI 31.64 kg/m   Constitutional:  Well nourished. Alert and oriented, No acute distress. HEENT:  AT, moist mucus membranes.  Trachea midline, no masses. Cardiovascular: No clubbing, cyanosis, or edema. Respiratory: Normal respiratory effort, no increased work of breathing. Neurologic: Grossly intact, no focal deficits, moving all 4 extremities. Psychiatric: Normal mood and affect.   Laboratory Data: No results found for: WBC, HGB, HCT, MCV, PLT  Lab Results  Component Value Date   CREATININE 0.70 07/11/2018    No results found for: PSA  No results found for: TESTOSTERONE  No results found for: HGBA1C  No results found for: TSH  No results found for: CHOL, HDL, CHOLHDL, VLDL, LDLCALC  No results found for: AST No results found for: ALT No components found for: ALKALINEPHOPHATASE No components found for: BILIRUBINTOTAL  No results found for: ESTRADIOL  Urinalysis Component     Latest Ref Rng & Units 08/10/2019  Specific Gravity, UA     1.005 - 1.030 1.020  pH, UA     5.0 - 7.5 7.0  Color, UA     Yellow Yellow  Appearance Ur     Clear Hazy (A)  Leukocytes,UA     Negative Negative  Protein,UA     Negative/Trace Negative  Glucose, UA     Negative Negative   Ketones, UA     Negative Negative  RBC, UA     Negative Trace (A)  Bilirubin, UA     Negative Negative  Urobilinogen, Ur     0.2 - 1.0 mg/dL 0.2  Nitrite, UA     Negative Negative  Microscopic Examination      See below:   Component     Latest Ref Rng & Units 08/10/2019  WBC, UA     0 - 5 /hpf 0-5  RBC     0 - 2 /hpf None seen  Epithelial Cells (non renal)     0 - 10 /hpf 0-10  Bacteria, UA     None seen/Few None seen   I have reviewed the labs.   Pertinent Imaging: Results for SAVERA, DONSON (MRN [id number]) as of 08/10/2019 09:37  Ref. Range 08/10/2019 09:20  Scan Result Unknown 0      Assessment & Plan:    1. History of  hematuria Hematuria work up completed in 06/2018 - findings positive for NED No report of gross hematuria  UA today negative for AMH RTC in one year for UA - patient to report any gross hematuria in the interim    2. History of rUTI's Asymptomatic at this visit  Continue trimethoprim RTC in 6 months for recheck Patient to report any break through symptoms  3. Mixed incontinence Myrbetriq was ineffective Behavioral and dietary changes ineffective Does not want a trial of anticholinergic due to side effect profile More concerned about her back issues at this time RTC in 6 months for a recheck                                            Return in about 6 months (around 02/10/2020) for recheck .  These notes generated with voice recognition software. I apologize for typographical errors.  Zara Council, PA-C  [facility] [address]  [place], [place] [address] [phone]

## 2019-08-10 ENCOUNTER — Ambulatory Visit (INDEPENDENT_AMBULATORY_CARE_PROVIDER_SITE_OTHER): Payer: PPO | Admitting: Urology

## 2019-08-10 ENCOUNTER — Encounter: Payer: Self-pay | Admitting: Urology

## 2019-08-10 ENCOUNTER — Other Ambulatory Visit: Payer: Self-pay

## 2019-08-10 VITALS — BP 135/87 | HR 77 | Ht 66.0 in | Wt 196.0 lb

## 2019-08-10 DIAGNOSIS — Z87448 Personal history of other diseases of urinary system: Secondary | ICD-10-CM

## 2019-08-10 DIAGNOSIS — Z87440 Personal history of urinary (tract) infections: Secondary | ICD-10-CM

## 2019-08-10 DIAGNOSIS — N3946 Mixed incontinence: Secondary | ICD-10-CM | POA: Diagnosis not present

## 2019-08-10 LAB — MICROSCOPIC EXAMINATION
Bacteria, UA: NONE SEEN
RBC: NONE SEEN /hpf (ref 0–2)

## 2019-08-10 LAB — URINALYSIS, COMPLETE
Bilirubin, UA: NEGATIVE
Glucose, UA: NEGATIVE
Ketones, UA: NEGATIVE
Leukocytes,UA: NEGATIVE
Nitrite, UA: NEGATIVE
Protein,UA: NEGATIVE
Specific Gravity, UA: 1.02 (ref 1.005–1.030)
Urobilinogen, Ur: 0.2 mg/dL (ref 0.2–1.0)
pH, UA: 7 (ref 5.0–7.5)

## 2019-08-10 LAB — BLADDER SCAN AMB NON-IMAGING: Scan Result: 0

## 2019-08-17 DIAGNOSIS — Z1283 Encounter for screening for malignant neoplasm of skin: Secondary | ICD-10-CM | POA: Diagnosis not present

## 2019-08-17 DIAGNOSIS — L814 Other melanin hyperpigmentation: Secondary | ICD-10-CM | POA: Diagnosis not present

## 2019-08-17 DIAGNOSIS — L821 Other seborrheic keratosis: Secondary | ICD-10-CM | POA: Diagnosis not present

## 2019-08-17 DIAGNOSIS — D1801 Hemangioma of skin and subcutaneous tissue: Secondary | ICD-10-CM | POA: Diagnosis not present

## 2019-08-17 DIAGNOSIS — Z85828 Personal history of other malignant neoplasm of skin: Secondary | ICD-10-CM | POA: Diagnosis not present

## 2019-08-17 DIAGNOSIS — D225 Melanocytic nevi of trunk: Secondary | ICD-10-CM | POA: Diagnosis not present

## 2019-08-17 DIAGNOSIS — D1800 Hemangioma unspecified site: Secondary | ICD-10-CM | POA: Diagnosis not present

## 2019-08-17 DIAGNOSIS — Z85820 Personal history of malignant melanoma of skin: Secondary | ICD-10-CM | POA: Diagnosis not present

## 2019-08-23 DIAGNOSIS — M545 Low back pain: Secondary | ICD-10-CM | POA: Diagnosis not present

## 2019-08-28 DIAGNOSIS — M545 Low back pain: Secondary | ICD-10-CM | POA: Diagnosis not present

## 2019-09-03 DIAGNOSIS — M545 Low back pain: Secondary | ICD-10-CM | POA: Diagnosis not present

## 2019-09-10 ENCOUNTER — Other Ambulatory Visit: Payer: Self-pay

## 2019-09-10 ENCOUNTER — Telehealth: Payer: BLUE CROSS/BLUE SHIELD | Admitting: Family Medicine

## 2019-09-10 ENCOUNTER — Encounter: Payer: Self-pay | Admitting: Family Medicine

## 2019-09-10 DIAGNOSIS — Z01419 Encounter for gynecological examination (general) (routine) without abnormal findings: Secondary | ICD-10-CM

## 2019-09-10 DIAGNOSIS — M545 Low back pain: Secondary | ICD-10-CM | POA: Diagnosis not present

## 2019-09-10 NOTE — Progress Notes (Signed)
Patient received a 1 year call back and scheduled an appointment to discuss. She is > 65, with h/o TAH and nml pap last year. She should not need further GYN services. Spoke with patient for 1 minute on the phone to reassure her.

## 2019-09-17 DIAGNOSIS — M545 Low back pain: Secondary | ICD-10-CM | POA: Diagnosis not present

## 2019-09-24 DIAGNOSIS — M545 Low back pain: Secondary | ICD-10-CM | POA: Diagnosis not present

## 2019-10-08 DIAGNOSIS — F419 Anxiety disorder, unspecified: Secondary | ICD-10-CM | POA: Diagnosis not present

## 2019-10-08 DIAGNOSIS — E669 Obesity, unspecified: Secondary | ICD-10-CM | POA: Diagnosis not present

## 2019-10-08 DIAGNOSIS — F330 Major depressive disorder, recurrent, mild: Secondary | ICD-10-CM | POA: Diagnosis not present

## 2019-10-08 DIAGNOSIS — I10 Essential (primary) hypertension: Secondary | ICD-10-CM | POA: Diagnosis not present

## 2019-10-08 DIAGNOSIS — Z86010 Personal history of colonic polyps: Secondary | ICD-10-CM | POA: Diagnosis not present

## 2019-10-08 DIAGNOSIS — Z8739 Personal history of other diseases of the musculoskeletal system and connective tissue: Secondary | ICD-10-CM | POA: Diagnosis not present

## 2019-10-15 DIAGNOSIS — M545 Low back pain: Secondary | ICD-10-CM | POA: Diagnosis not present

## 2019-10-26 ENCOUNTER — Ambulatory Visit
Admission: RE | Admit: 2019-10-26 | Discharge: 2019-10-26 | Disposition: A | Discharge status: Home / Self Care | Payer: PPO | Source: Ambulatory Visit | Attending: Family Medicine | Admitting: Family Medicine

## 2019-10-26 DIAGNOSIS — Z1231 Encounter for screening mammogram for malignant neoplasm of breast: Secondary | ICD-10-CM | POA: Diagnosis not present

## 2019-10-30 DIAGNOSIS — D3132 Benign neoplasm of left choroid: Secondary | ICD-10-CM | POA: Diagnosis not present

## 2019-12-25 DIAGNOSIS — E785 Hyperlipidemia, unspecified: Secondary | ICD-10-CM | POA: Diagnosis not present

## 2019-12-25 DIAGNOSIS — I10 Essential (primary) hypertension: Secondary | ICD-10-CM | POA: Diagnosis not present

## 2020-01-01 ENCOUNTER — Other Ambulatory Visit: Payer: Self-pay | Admitting: Family Medicine

## 2020-01-01 DIAGNOSIS — Z0000 Encounter for general adult medical examination without abnormal findings: Secondary | ICD-10-CM | POA: Diagnosis not present

## 2020-01-01 DIAGNOSIS — Z8639 Personal history of other endocrine, nutritional and metabolic disease: Secondary | ICD-10-CM | POA: Diagnosis not present

## 2020-01-01 DIAGNOSIS — F329 Major depressive disorder, single episode, unspecified: Secondary | ICD-10-CM | POA: Diagnosis not present

## 2020-01-01 DIAGNOSIS — I10 Essential (primary) hypertension: Secondary | ICD-10-CM | POA: Diagnosis not present

## 2020-01-01 DIAGNOSIS — E785 Hyperlipidemia, unspecified: Secondary | ICD-10-CM | POA: Diagnosis not present

## 2020-01-09 ENCOUNTER — Ambulatory Visit
Admission: RE | Admit: 2020-01-09 | Discharge: 2020-01-09 | Disposition: A | Discharge status: Home / Self Care | Payer: PPO | Source: Ambulatory Visit | Attending: Family Medicine | Admitting: Family Medicine

## 2020-01-09 ENCOUNTER — Other Ambulatory Visit: Payer: Self-pay

## 2020-01-09 DIAGNOSIS — Z8639 Personal history of other endocrine, nutritional and metabolic disease: Secondary | ICD-10-CM | POA: Insufficient documentation

## 2020-01-09 DIAGNOSIS — E042 Nontoxic multinodular goiter: Secondary | ICD-10-CM | POA: Diagnosis not present

## 2020-01-28 ENCOUNTER — Ambulatory Visit: Admit: 2020-01-28 | Payer: PPO | Admitting: Internal Medicine

## 2020-01-28 SURGERY — COLONOSCOPY WITH PROPOFOL
Anesthesia: General

## 2020-01-30 DIAGNOSIS — E042 Nontoxic multinodular goiter: Secondary | ICD-10-CM | POA: Diagnosis not present

## 2020-02-12 ENCOUNTER — Ambulatory Visit: Payer: PPO | Admitting: Urology

## 2020-02-12 DIAGNOSIS — E042 Nontoxic multinodular goiter: Secondary | ICD-10-CM | POA: Diagnosis not present

## 2020-02-13 DIAGNOSIS — E041 Nontoxic single thyroid nodule: Secondary | ICD-10-CM | POA: Diagnosis not present

## 2020-04-04 DIAGNOSIS — R293 Abnormal posture: Secondary | ICD-10-CM | POA: Diagnosis not present

## 2020-04-04 DIAGNOSIS — M545 Low back pain: Secondary | ICD-10-CM | POA: Diagnosis not present

## 2020-04-04 DIAGNOSIS — G8929 Other chronic pain: Secondary | ICD-10-CM | POA: Diagnosis not present

## 2020-04-08 ENCOUNTER — Other Ambulatory Visit: Payer: Self-pay

## 2020-04-08 ENCOUNTER — Ambulatory Visit: Payer: PPO | Admitting: Dermatology

## 2020-04-08 DIAGNOSIS — D1800 Hemangioma unspecified site: Secondary | ICD-10-CM | POA: Diagnosis not present

## 2020-04-08 DIAGNOSIS — Z85828 Personal history of other malignant neoplasm of skin: Secondary | ICD-10-CM

## 2020-04-08 DIAGNOSIS — L820 Inflamed seborrheic keratosis: Secondary | ICD-10-CM | POA: Diagnosis not present

## 2020-04-08 DIAGNOSIS — Z85820 Personal history of malignant melanoma of skin: Secondary | ICD-10-CM

## 2020-04-08 DIAGNOSIS — D229 Melanocytic nevi, unspecified: Secondary | ICD-10-CM

## 2020-04-08 DIAGNOSIS — L814 Other melanin hyperpigmentation: Secondary | ICD-10-CM

## 2020-04-08 DIAGNOSIS — L821 Other seborrheic keratosis: Secondary | ICD-10-CM | POA: Diagnosis not present

## 2020-04-08 DIAGNOSIS — L578 Other skin changes due to chronic exposure to nonionizing radiation: Secondary | ICD-10-CM | POA: Diagnosis not present

## 2020-04-08 DIAGNOSIS — D225 Melanocytic nevi of trunk: Secondary | ICD-10-CM | POA: Diagnosis not present

## 2020-04-08 DIAGNOSIS — L719 Rosacea, unspecified: Secondary | ICD-10-CM

## 2020-04-08 DIAGNOSIS — Z1283 Encounter for screening for malignant neoplasm of skin: Secondary | ICD-10-CM | POA: Diagnosis not present

## 2020-04-08 MED ORDER — METRONIDAZOLE 0.75 % EX CREA: TOPICAL_CREAM | CUTANEOUS | 2 refills | Status: DC

## 2020-04-08 NOTE — Progress Notes (Signed)
Follow-Up Visit   Subjective  Michelle Cisneros is a 64 y.o. female who presents for the following: Annual Exam.  Patient here today for TBSE. She has a history of BCC, SCC and MM. Patient advises new spots on right lower neck and right forearm that are irritated.   The following portions of the chart were reviewed this encounter and updated as appropriate:     Review of Systems:  No other skin or systemic complaints except as noted in HPI or Assessment and Plan.  Objective  Well appearing patient in no apparent distress; mood and affect are within normal limits.  A full examination was performed including scalp, head, eyes, ears, nose, lips, neck, chest, axillae, abdomen, back, buttocks, bilateral upper extremities, bilateral lower extremities, hands, feet, fingers, toes, fingernails, and toenails. All findings within normal limits unless otherwise noted below.  Objective  Left Medial Ankle: Well healed scar with no evidence of recurrence  Objective  Left Forehead: Well healed scar with no evidence of recurrence.   Objective  Right Forearm: Well healed scar with no evidence of recurrence  Objective  Right Forearm x 1, Right Lower Neck x 1, Left Upper Arm x 1 (3): Erythematous keratotic or waxy stuck-on papule.  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   History of basal cell carcinoma (BCC) Left Forehead  No evidence of recurrence, call clinic for new or changing lesions.   History of melanoma Right Forearm  No evidence of recurrence, call clinic for new or changing lesions.   Inflamed seborrheic keratosis (3) Right Forearm x  1, Right Lower Neck x 1, Left Upper Arm x 1  Patient defers LN2 to ISK on scalp  Destruction of lesion - Right Forearm x 1, Right Lower Neck x 1, Left Upper Arm x 1 Complexity: simple   Destruction method: cryotherapy   Informed consent: discussed and consent obtained   Lesion destroyed using liquid nitrogen: Yes   Region frozen until ice ball extended beyond lesion: Yes   Outcome: patient tolerated procedure well with no complications   Post-procedure details: wound care instructions given    Nevus (2) Left Medial Buttock; Right Lower Back  Benign-appearing.  Observation.  Call clinic for new or changing moles.  Recommend daily use of broad spectrum spf 30+ sunscreen to sun-exposed areas.    Rosacea Malar Cheeks, nose  Start metronidazole 0.75% cream QHS increasing to BID PRN flares.   metroNIDAZOLE (METROCREAM) 0.75 % cream - Malar Cheeks, nose  Skin cancer screening performed today.  Seborrheic Keratoses - Stuck-on, waxy, tan-brown papules and plaques  - Discussed benign etiology and prognosis. - Observe - Call for any changes  Actinic Damage - diffuse scaly erythematous macules with underlying dyspigmentation - Recommend daily broad spectrum sunscreen SPF 30+ to sun-exposed areas, reapply every 2 hours as needed.  - Call for new or changing lesions.  Lentigines - Scattered tan macules - Discussed due to sun exposure - Benign, observe - Call for any changes  Hemangiomas - Red papules - Discussed benign nature - Observe - Call for any changes   Return in about 1 year (around 04/08/2021) for  TBSE.  Graciella Belton, RMA, am acting as scribe for Brendolyn Patty, MD .  Documentation: I have reviewed the above documentation for accuracy and completeness, and I agree with the above.  Brendolyn Patty, MD

## 2020-04-08 NOTE — Patient Instructions (Addendum)
Recommend daily broad spectrum sunscreen SPF 30+ to sun-exposed areas, reapply every 2 hours as needed. Call for new or changing lesions.  Cryotherapy Aftercare  . Wash gently with soap and water everyday.   . Apply Vaseline and Band-Aid daily until healed.  Melanoma ABCDEs  Melanoma is the most dangerous type of skin cancer, and is the leading cause of death from skin disease.  You are more likely to develop melanoma if you:  Have light-colored skin, light-colored eyes, or red or blond hair  Spend a lot of time in the sun  Tan regularly, either outdoors or in a tanning bed  Have had blistering sunburns, especially during childhood  Have a close family member who has had a melanoma  Have atypical moles or large birthmarks  Early detection of melanoma is key since treatment is typically straightforward and cure rates are extremely high if we catch it early.   The first sign of melanoma is often a change in a mole or a new dark spot.  The ABCDE system is a way of remembering the signs of melanoma.  A for asymmetry:  The two halves do not match. B for border:  The edges of the growth are irregular. C for color:  A mixture of colors are present instead of an even brown color. D for diameter:  Melanomas are usually (but not always) greater than 6mm - the size of a pencil eraser. E for evolution:  The spot keeps changing in size, shape, and color.  Please check your skin once per month between visits. You can use a small mirror in front and a large mirror behind you to keep an eye on the back side or your body.   If you see any new or changing lesions before your next follow-up, please call to schedule a visit.  Please continue daily skin protection including broad spectrum sunscreen SPF 30+ to sun-exposed areas, reapplying every 2 hours as needed when you're outdoors.   

## 2020-04-18 ENCOUNTER — Ambulatory Visit: Payer: PPO | Admitting: Internal Medicine

## 2020-04-18 ENCOUNTER — Encounter: Payer: Self-pay | Admitting: Internal Medicine

## 2020-04-18 ENCOUNTER — Other Ambulatory Visit: Payer: Self-pay

## 2020-04-18 VITALS — BP 148/100 | HR 77 | Ht 66.0 in | Wt 194.1 lb

## 2020-04-18 DIAGNOSIS — I700 Atherosclerosis of aorta: Secondary | ICD-10-CM

## 2020-04-18 DIAGNOSIS — I10 Essential (primary) hypertension: Secondary | ICD-10-CM | POA: Diagnosis not present

## 2020-04-18 DIAGNOSIS — R0600 Dyspnea, unspecified: Secondary | ICD-10-CM | POA: Diagnosis not present

## 2020-04-18 DIAGNOSIS — E785 Hyperlipidemia, unspecified: Secondary | ICD-10-CM | POA: Diagnosis not present

## 2020-04-18 DIAGNOSIS — R0609 Other forms of dyspnea: Secondary | ICD-10-CM

## 2020-04-18 MED ORDER — LISINOPRIL 20 MG PO TABS
20.0000 mg | ORAL_TABLET | Freq: Every day | ORAL | 3 refills | Status: DC
Start: 2020-04-18 — End: 2021-04-28

## 2020-04-18 MED ORDER — ASPIRIN EC 81 MG PO TBEC: 81.0000 mg | DELAYED_RELEASE_TABLET | Freq: Every day | ORAL | 3 refills | Status: DC

## 2020-04-18 NOTE — Patient Instructions (Signed)
Medication Instructions:  Your physician has recommended you make the following change in your medication:  1) START taking Aspirin 81 mg daily  2) INCREASE lisinopril to 20 mg daily  *If you need a refill on your cardiac medications before your next appointment, please call your pharmacy*   Lab Work: BMET in 2 weeks  If you have labs (blood work) drawn today and your tests are completely normal, you will receive your results only by: Marland Kitchen MyChart Message (if you have MyChart) OR . A paper copy in the mail If you have any lab test that is abnormal or we need to change your treatment, we will call you to review the results.   Testing/Procedures: Your physician has requested that you have an echocardiogram. Echocardiography is a painless test that uses sound waves to create images of your heart. It provides your doctor with information about the size and shape of your heart and how well your heart's chambers and valves are working. This procedure takes approximately one hour. There are no restrictions for this procedure.  Your physician has requested that you have a lexiscan myoview. For further information please visit [email or web address]. Please follow instruction sheet, as given.  Follow-Up: At [facility], you and your health needs are our priority.  As part of our continuing mission to provide you with exceptional heart care, we have created designated Provider Care Teams.  These Care Teams include your primary Cardiologist (physician) and Advanced Practice Providers (APPs -  Physician Assistants and Nurse Practitioners) who all work together to provide you with the care you need, when you need it.  Your next appointment:   6 week(s)  The format for your next appointment:   In Person  Provider:    You may see Nelva Bush, MD or one of the following Advanced Practice Providers on your designated Care Team:    Murray Hodgkins, NP  Christell Faith, PA-C  Marrianne Mood,  PA-C

## 2020-04-18 NOTE — Progress Notes (Signed)
New Outpatient Visit Date: 04/18/2020  Primary Care Provider: Maryland Pink, MD [address] [place],  [place] [address]  Chief Complaint: Atherosclerosis and elevated blood pressure  HPI:  Michelle Cisneros is a 64 y.o. female who is being seen today for the evaluation of incidentally discovered atherosclerosis as well as elevated blood pressure. She has a history of hypertension, hyperlipidemia, skin cancers, and back pain.  As part of her back pain work-up, imaging reportedly showed atherosclerosis in the visualized vessels.  Ms. Michelle Cisneros denies a history of prior heart disease.  She notes that her blood pressure has been fluctuating quite a bit recently.  She is currently on lisinopril and atorvastatin by her PCP.  She wishes to begin doing more physical therapy to help with her back pain but wants to make sure that her heart will tolerate this.  Ms. Michelle Cisneros has chronic shortness of breath with activity.  She denies chest pain, palpitations, lightheadedness, and edema.  She notes occasional cramps in her left calf, especially at night.  She has also been experiencing more pain in her left lower back and thigh that she attributes to sciatica.  She has not undergone cardiac testing in the past.  --------------------------------------------------------------------------------------------------  Cardiovascular History & Procedures: Cardiovascular Problems:  Atherosclerosis  Risk Factors:  Atherosclerosis, hypertension, age greater than 12, and prior tobacco use  Cath/PCI:  None  CV Surgery:  None  EP Procedures and Devices:  None  Non-Invasive Evaluation(s):  None  Recent CV Pertinent Labs: Lab Results  Component Value Date   CREATININE 0.70 07/11/2018    --------------------------------------------------------------------------------------------------  Past Medical History:  Diagnosis Date  . Arthritis   . Back pain   . Collagen vascular disease  ([place])   . Depression   . Hyperlipemia   . Hypertension   . Melanoma ([place])   . Recurrent UTI     Past Surgical History:  Procedure Laterality Date  . ABDOMINAL HYSTERECTOMY    . INCONTINENCE SURGERY    . OOPHORECTOMY      Current Meds  Medication Sig  . atorvastatin (LIPITOR) 10 MG tablet TAKE ONE TABLET BY MOUTH DAILY  . buPROPion (WELLBUTRIN XL) 300 MG 24 hr tablet TAKE ONE TABLET BY MOUTH DAILY  . Calcium Carbonate-Vitamin D 600-400 MG-UNIT tablet Take by mouth.  . Omega-3 Fatty Acids (FISH OIL) 1000 MG CAPS Take 1 capsule by mouth daily.  Marland Kitchen trimethoprim (TRIMPEX) 100 MG tablet Take 1 tablet (100 mg total) by mouth daily.  . [DISCONTINUED] lisinopril (PRINIVIL,ZESTRIL) 10 MG tablet Take by mouth.    Allergies: Patient has no known allergies.  Social History   Tobacco Use  . Smoking status: Former Smoker    Packs/day: 1.00    Years: 30.00    Pack years: 30.00    Types: Cigarettes    Quit date: 10/04/1987    Years since quitting: 32.5  . Smokeless tobacco: Never Used  Substance Use Topics  . Alcohol use: Yes    Comment: occasional; once glass of wine per month  . Drug use: Not Currently    Family History  Problem Relation Age of Onset  . Cancer Father        laryngeal  . Atrial fibrillation Father   . Stroke Mother   . Breast cancer Neg Hx   . Bladder Cancer Neg Hx   . Kidney cancer Neg Hx     Review of Systems: A 12-system review of systems was performed and was negative except  as noted in the HPI.  --------------------------------------------------------------------------------------------------  Physical Exam: BP (!) 148/100 (BP Location: Right Arm, Patient Position: Sitting, Cuff Size: Normal)   Pulse 77   Ht 5\' 6"  (1.676 m)   Wt 194 lb 2 oz (88.1 kg)   SpO2 98%   BMI 31.33 kg/m   General: NAD.  Accompanied by her daughter. HEENT: No conjunctival pallor or scleral icterus. Facemask in place. Neck: Supple without lymphadenopathy, thyromegaly,  JVD, or HJR. No carotid bruit. Lungs: Normal work of breathing. Clear to auscultation bilaterally without wheezes or crackles. Heart: Regular rate and rhythm without murmurs, rubs, or gallops. Non-displaced PMI. Abd: Bowel sounds present. Soft, NT/ND without hepatosplenomegaly Ext: No lower extremity edema. Radial, PT, and DP pulses are 2+ bilaterally Skin: Warm and dry without rash. Neuro: CNIII-XII intact. Strength and fine-touch sensation intact in upper and lower extremities bilaterally. Psych: Normal mood and affect.  EKG: Normal sinus rhythm without abnormality.  Outside labs: CBC (12/25/2019): White blood cell count 7.0, hemoglobin 14.4, hematocrit 43.8, platelets 294  CMP (12/25/2019): Sodium 138, potassium 4.7, chloride 103, CO2 29, BUN 14, creatinine 0.8, glucose 94, calcium 9.6, AST 22, ALT 26, alkaline phosphatase 79, total bilirubin 0.9, total protein 7.1, albumin 4.4  Lipid panel (12/25/2019): Total cholesterol 185, triglycerides 108, HDL 79, LDL 84  --------------------------------------------------------------------------------------------------  ASSESSMENT AND PLAN: Dyspnea on exertion and aortic atherosclerosis: Ms. Michelle Cisneros denies a history of cardiovascular disease, though recent back imaging reportedly made note of atherosclerosis.  I have reviewed CT of the abdomen pelvis for hematuria work-up from 2019, which shows some atherosclerosis involving the distal abdominal aorta and iliac arteries.  Given her exertional dyspnea, I have recommended that we obtain a transthoracic echocardiogram and pharmacologic myocardial perfusion stress test to exclude significant CAD and structural heart disease.  I have recommended addition of aspirin 81 mg daily.  Hypertension: Blood pressure suboptimally controlled today.  We have agreed to increase lisinopril to 20 mg daily.  BMP should be repeated in 2 weeks.  Hyperlipidemia: Recent lipid panel reasonable with LDL of 84.  Continue  atorvastatin.  Follow-up: Return to clinic in 6 weeks.  Nelva Bush, MD 04/19/2020 11:20 AM

## 2020-04-19 ENCOUNTER — Encounter: Payer: Self-pay | Admitting: Internal Medicine

## 2020-04-19 DIAGNOSIS — R0609 Other forms of dyspnea: Secondary | ICD-10-CM | POA: Insufficient documentation

## 2020-04-19 DIAGNOSIS — I10 Essential (primary) hypertension: Secondary | ICD-10-CM | POA: Insufficient documentation

## 2020-04-19 DIAGNOSIS — E785 Hyperlipidemia, unspecified: Secondary | ICD-10-CM | POA: Insufficient documentation

## 2020-04-19 DIAGNOSIS — I700 Atherosclerosis of aorta: Secondary | ICD-10-CM | POA: Insufficient documentation

## 2020-04-24 ENCOUNTER — Other Ambulatory Visit: Payer: Self-pay

## 2020-04-24 DIAGNOSIS — N3946 Mixed incontinence: Secondary | ICD-10-CM

## 2020-04-24 DIAGNOSIS — N390 Urinary tract infection, site not specified: Secondary | ICD-10-CM

## 2020-04-24 MED ORDER — TRIMETHOPRIM 100 MG PO TABS: 100.0000 mg | ORAL_TABLET | Freq: Every day | ORAL | 3 refills | Status: DC

## 2020-04-24 NOTE — Telephone Encounter (Signed)
Pt calls and states she needs a refill on trimethoprim, pt seen with the year. RX sent. Yearly follow up scheduled.

## 2020-05-05 ENCOUNTER — Encounter
Admission: RE | Admit: 2020-05-05 | Discharge: 2020-05-05 | Disposition: A | Discharge status: Home / Self Care | Payer: PPO | Source: Ambulatory Visit | Attending: Internal Medicine | Admitting: Internal Medicine

## 2020-05-05 ENCOUNTER — Other Ambulatory Visit: Payer: Self-pay

## 2020-05-05 DIAGNOSIS — R0600 Dyspnea, unspecified: Secondary | ICD-10-CM

## 2020-05-05 DIAGNOSIS — R0609 Other forms of dyspnea: Secondary | ICD-10-CM

## 2020-05-05 LAB — NM MYOCAR MULTI W/SPECT W/WALL MOTION / EF
Estimated workload: 1 METS
Exercise duration (min): 0 min
Exercise duration (sec): 0 s
LV dias vol: 85 mL (ref 46–106)
LV sys vol: 25 mL
MPHR: 154 {beats}/min
Peak HR: 102 {beats}/min
Percent HR: 66 %
Rest HR: 65 {beats}/min
SDS: 0
SRS: 14
SSS: 4
TID: 0.95

## 2020-05-05 MED ORDER — TECHNETIUM TC 99M TETROFOSMIN IV KIT
10.0000 | PACK | Freq: Once | INTRAVENOUS | Status: AC | PRN
  Administered 2020-05-05: 10 via INTRAVENOUS

## 2020-05-05 MED ORDER — REGADENOSON 0.4 MG/5ML IV SOLN
0.4000 mg | Freq: Once | INTRAVENOUS | Status: AC
  Administered 2020-05-05: 0.4 mg via INTRAVENOUS

## 2020-05-05 MED ORDER — TECHNETIUM TC 99M TETROFOSMIN IV KIT
31.7300 | PACK | Freq: Once | INTRAVENOUS | Status: AC | PRN
  Administered 2020-05-05: 31.73 via INTRAVENOUS

## 2020-05-22 ENCOUNTER — Other Ambulatory Visit
Admission: RE | Admit: 2020-05-22 | Discharge: 2020-05-22 | Disposition: A | Discharge status: Home / Self Care | Payer: PPO | Source: Ambulatory Visit | Attending: Internal Medicine | Admitting: Internal Medicine

## 2020-05-22 ENCOUNTER — Other Ambulatory Visit: Payer: Self-pay

## 2020-05-22 ENCOUNTER — Ambulatory Visit (INDEPENDENT_AMBULATORY_CARE_PROVIDER_SITE_OTHER): Payer: PPO

## 2020-05-22 DIAGNOSIS — Z01812 Encounter for preprocedural laboratory examination: Secondary | ICD-10-CM | POA: Diagnosis not present

## 2020-05-22 DIAGNOSIS — Z20822 Contact with and (suspected) exposure to covid-19: Secondary | ICD-10-CM | POA: Insufficient documentation

## 2020-05-22 DIAGNOSIS — R0609 Other forms of dyspnea: Secondary | ICD-10-CM

## 2020-05-22 DIAGNOSIS — R0600 Dyspnea, unspecified: Secondary | ICD-10-CM | POA: Diagnosis not present

## 2020-05-22 LAB — BASIC METABOLIC PANEL
Anion gap: 10 (ref 5–15)
BUN: 17 mg/dL (ref 8–23)
CO2: 26 mmol/L (ref 22–32)
Calcium: 9.3 mg/dL (ref 8.9–10.3)
Chloride: 103 mmol/L (ref 98–111)
Creatinine, Ser: 0.86 mg/dL (ref 0.44–1.00)
GFR calc Af Amer: >60 mL/min (ref >60)
GFR calc non Af Amer: >60 mL/min (ref >60)
Glucose, Bld: 115 mg/dL — ABNORMAL HIGH (ref 70–99)
Potassium: 4.2 mmol/L (ref 3.5–5.1)
Sodium: 139 mmol/L (ref 135–145)

## 2020-05-23 LAB — SARS CORONAVIRUS 2 (TAT 6-24 HRS): SARS Coronavirus 2: NEGATIVE

## 2020-05-26 ENCOUNTER — Ambulatory Visit: Payer: PPO | Admitting: Anesthesiology

## 2020-05-26 ENCOUNTER — Ambulatory Visit
Admission: RE | Admit: 2020-05-26 | Discharge: 2020-05-26 | Disposition: A | Discharge status: Home / Self Care | Payer: PPO | Source: Ambulatory Visit | Attending: Internal Medicine | Admitting: Internal Medicine

## 2020-05-26 ENCOUNTER — Other Ambulatory Visit: Payer: Self-pay

## 2020-05-26 ENCOUNTER — Encounter
Admission: RE | Disposition: A | Discharge status: Home / Self Care | Payer: Self-pay | Source: Ambulatory Visit | Attending: Internal Medicine

## 2020-05-26 DIAGNOSIS — K640 First degree hemorrhoids: Secondary | ICD-10-CM | POA: Diagnosis not present

## 2020-05-26 DIAGNOSIS — I10 Essential (primary) hypertension: Secondary | ICD-10-CM | POA: Diagnosis not present

## 2020-05-26 DIAGNOSIS — D122 Benign neoplasm of ascending colon: Secondary | ICD-10-CM | POA: Diagnosis not present

## 2020-05-26 DIAGNOSIS — F329 Major depressive disorder, single episode, unspecified: Secondary | ICD-10-CM | POA: Insufficient documentation

## 2020-05-26 DIAGNOSIS — M1990 Unspecified osteoarthritis, unspecified site: Secondary | ICD-10-CM | POA: Diagnosis not present

## 2020-05-26 DIAGNOSIS — D120 Benign neoplasm of cecum: Secondary | ICD-10-CM | POA: Insufficient documentation

## 2020-05-26 DIAGNOSIS — G5601 Carpal tunnel syndrome, right upper limb: Secondary | ICD-10-CM | POA: Diagnosis not present

## 2020-05-26 DIAGNOSIS — E785 Hyperlipidemia, unspecified: Secondary | ICD-10-CM | POA: Diagnosis not present

## 2020-05-26 DIAGNOSIS — Z86010 Personal history of colonic polyps: Secondary | ICD-10-CM | POA: Diagnosis not present

## 2020-05-26 DIAGNOSIS — Z1211 Encounter for screening for malignant neoplasm of colon: Secondary | ICD-10-CM | POA: Insufficient documentation

## 2020-05-26 DIAGNOSIS — K5790 Diverticulosis of intestine, part unspecified, without perforation or abscess without bleeding: Secondary | ICD-10-CM | POA: Diagnosis not present

## 2020-05-26 DIAGNOSIS — Z79899 Other long term (current) drug therapy: Secondary | ICD-10-CM | POA: Diagnosis not present

## 2020-05-26 DIAGNOSIS — Z7982 Long term (current) use of aspirin: Secondary | ICD-10-CM | POA: Diagnosis not present

## 2020-05-26 DIAGNOSIS — K635 Polyp of colon: Secondary | ICD-10-CM | POA: Diagnosis not present

## 2020-05-26 DIAGNOSIS — K5730 Diverticulosis of large intestine without perforation or abscess without bleeding: Secondary | ICD-10-CM | POA: Diagnosis not present

## 2020-05-26 DIAGNOSIS — K648 Other hemorrhoids: Secondary | ICD-10-CM | POA: Diagnosis not present

## 2020-05-26 HISTORY — PX: COLONOSCOPY WITH PROPOFOL: SHX5780

## 2020-05-26 SURGERY — COLONOSCOPY WITH PROPOFOL
Anesthesia: General

## 2020-05-26 MED ORDER — MIDAZOLAM HCL 2 MG/2ML IJ SOLN
INTRAMUSCULAR | Status: DC | PRN
  Administered 2020-05-26: 2 mg via INTRAVENOUS

## 2020-05-26 MED ORDER — PROPOFOL 500 MG/50ML IV EMUL
INTRAVENOUS | Status: DC | PRN
  Administered 2020-05-26: 50 ug/kg/min via INTRAVENOUS

## 2020-05-26 MED ORDER — EPHEDRINE 5 MG/ML INJ
INTRAVENOUS | Status: AC
  Filled 2020-05-26: qty 10

## 2020-05-26 MED ORDER — EPHEDRINE SULFATE 50 MG/ML IJ SOLN
INTRAMUSCULAR | Status: DC | PRN
  Administered 2020-05-26 (×2): 10 mg via INTRAVENOUS

## 2020-05-26 MED ORDER — FENTANYL CITRATE (PF) 100 MCG/2ML IJ SOLN
INTRAMUSCULAR | Status: DC | PRN
  Administered 2020-05-26: 25 ug via INTRAVENOUS
  Administered 2020-05-26: 50 ug via INTRAVENOUS
  Administered 2020-05-26: 25 ug via INTRAVENOUS

## 2020-05-26 MED ORDER — MIDAZOLAM HCL 2 MG/2ML IJ SOLN
INTRAMUSCULAR | Status: AC
  Filled 2020-05-26: qty 2

## 2020-05-26 MED ORDER — LIDOCAINE HCL (CARDIAC) PF 100 MG/5ML IV SOSY
PREFILLED_SYRINGE | INTRAVENOUS | Status: DC | PRN
  Administered 2020-05-26: 60 mg via INTRAVENOUS

## 2020-05-26 MED ORDER — SODIUM CHLORIDE 0.9 % IV SOLN
INTRAVENOUS | Status: DC
  Administered 2020-05-26: 1000 mL via INTRAVENOUS

## 2020-05-26 MED ORDER — PROPOFOL 500 MG/50ML IV EMUL
INTRAVENOUS | Status: AC
  Filled 2020-05-26: qty 50

## 2020-05-26 MED ORDER — PROPOFOL 10 MG/ML IV BOLUS
INTRAVENOUS | Status: DC | PRN
  Administered 2020-05-26: 30 mg via INTRAVENOUS

## 2020-05-26 MED ORDER — FENTANYL CITRATE (PF) 100 MCG/2ML IJ SOLN
INTRAMUSCULAR | Status: AC
  Filled 2020-05-26: qty 2

## 2020-05-26 NOTE — Interval H&P Note (Signed)
History and Physical Interval Note:  05/26/2020 10:37 AM  Michelle Cisneros  has presented today for surgery, with the diagnosis of P HX COLON POLYPS.  The various methods of treatment have been discussed with the patient and family. After consideration of risks, benefits and other options for treatment, the patient has consented to  Procedure(s): COLONOSCOPY WITH PROPOFOL (N/A) as a surgical intervention.  The patient's history has been reviewed, patient examined, no change in status, stable for surgery.  I have reviewed the patient's chart and labs.  Questions were answered to the patient's satisfaction.     [name], [name]

## 2020-05-26 NOTE — Anesthesia Preprocedure Evaluation (Signed)
Anesthesia Evaluation  Patient identified by MRN, date of birth, ID band Patient awake    Reviewed: Allergy & Precautions, H&P , NPO status , Patient's Chart, lab work & pertinent test results, reviewed documented beta blocker date and time   Airway Mallampati: II   Neck ROM: full    Dental  (+) Poor Dentition   Pulmonary neg pulmonary ROS, former smoker,    Pulmonary exam normal        Cardiovascular Exercise Tolerance: Good hypertension, On Medications negative cardio ROS Normal cardiovascular exam Rhythm:regular Rate:Normal     Neuro/Psych PSYCHIATRIC DISORDERS Depression  Neuromuscular disease    GI/Hepatic negative GI ROS, Neg liver ROS,   Endo/Other  negative endocrine ROS  Renal/GU negative Renal ROS  negative genitourinary   Musculoskeletal   Abdominal   Peds  Hematology negative hematology ROS (+)   Anesthesia Other Findings Past Medical History: No date: Arthritis No date: Back pain No date: Collagen vascular disease ([facility]) No date: Depression No date: Hyperlipemia No date: Hypertension No date: Melanoma ([place]) No date: Recurrent UTI Past Surgical History: No date: ABDOMINAL HYSTERECTOMY No date: INCONTINENCE SURGERY No date: OOPHORECTOMY BMI    Body Mass Index: 31.15 kg/m     Reproductive/Obstetrics negative OB ROS                             Anesthesia Physical Anesthesia Plan  ASA: III  Anesthesia Plan: General   Post-op Pain Management:    Induction:   PONV Risk Score and Plan:   Airway Management Planned:   Additional Equipment:   Intra-op Plan:   Post-operative Plan:   Informed Consent: I have reviewed the patients History and Physical, chart, labs and discussed the procedure including the risks, benefits and alternatives for the proposed anesthesia with the patient or authorized representative who has indicated his/her understanding and  acceptance.     Dental Advisory Given  Plan Discussed with: CRNA  Anesthesia Plan Comments:         Anesthesia Quick Evaluation

## 2020-05-26 NOTE — Op Note (Signed)
[facility] Patient Name: Michelle Cisneros Procedure Date: 05/26/2020 10:41 AM MRN: [id number] Account #: [id number] Date of Birth: Mar 21, 1956 Admit Type: Outpatient Age: 64 Room: [facility] ENDO ROOM 3 Gender: Female Note Status: Finalized Procedure:             Colonoscopy Indications:           High risk colon cancer surveillance: Personal history                         of colonic polyps Providers:             Benay Pike. Alice Reichert MD, MD Referring MD:          Irven Easterly. Kary Kos, MD (Referring MD) Medicines:             Propofol per Anesthesia Complications:         No immediate complications. Procedure:             Pre-Anesthesia Assessment:                        - The risks and benefits of the procedure and the                         sedation options and risks were discussed with the                         patient. All questions were answered and informed                         consent was obtained.                        - Patient identification and proposed procedure were                         verified prior to the procedure by the nurse. The                         procedure was verified in the procedure room.                        - ASA Grade Assessment: III - A patient with severe                         systemic disease.                        - After reviewing the risks and benefits, the patient                         was deemed in satisfactory condition to undergo the                         procedure.                        After obtaining informed consent, the colonoscope was                         passed under direct vision. Throughout the  procedure,                         the patient's blood pressure, pulse, and oxygen                         saturations were monitored continuously. The                         Colonoscope was introduced through the anus and                         advanced to the the cecum, identified by  appendiceal                         orifice and ileocecal valve. The colonoscopy was                         performed without difficulty. The patient tolerated                         the procedure well. The quality of the bowel                         preparation was good. The ileocecal valve, appendiceal                         orifice, and rectum were photographed. Findings:      The perianal and digital rectal examinations were normal. Pertinent       negatives include normal sphincter tone and no palpable rectal lesions.      Non-bleeding internal hemorrhoids were found during retroflexion. The       hemorrhoids were Grade I (internal hemorrhoids that do not prolapse).      Two sessile polyps were found in the mid ascending colon and cecum. The       polyps were 3 to 4 mm in size. These polyps were removed with a cold       biopsy forceps. Resection and retrieval were complete.      A 8 mm polyp was found in the cecum. The polyp was sessile. The polyp       was removed with a cold snare. Resection and retrieval were complete.      A 11 mm polyp was found in the proximal ascending colon. The polyp was       carpet-like. The polyp was removed with a hot snare. Resection and       retrieval were complete. To prevent bleeding after the polypectomy, one       hemostatic clip was successfully placed (MR conditional). There was no       bleeding during, or at the end, of the procedure.      A few small-mouthed diverticula were found in the sigmoid colon.      The exam was otherwise without abnormality. Impression:            - Non-bleeding internal hemorrhoids.                        - Two 3 to 4 mm polyps in the mid ascending colon and  in the cecum, removed with a cold biopsy forceps.                         Resected and retrieved.                        - One 8 mm polyp in the cecum, removed with a cold                         snare. Resected and retrieved.                         - One 11 mm polyp in the proximal ascending colon,                         removed with a hot snare. Resected and retrieved. Clip                         (MR conditional) was placed.                        - The examination was otherwise normal. Recommendation:        - Patient has a contact number available for                         emergencies. The signs and symptoms of potential                         delayed complications were discussed with the patient.                         Return to normal activities tomorrow. Written                         discharge instructions were provided to the patient.                        - Resume previous diet.                        - Continue present medications.                        - Repeat colonoscopy date to be determined after                         pending pathology results are reviewed for                         surveillance.                        - Return to GI office PRN.                        - The findings and recommendations were discussed with                         the patient. Procedure Code(s):     --- Professional ---  45385, Colonoscopy, flexible; with removal of                         tumor(s), polyp(s), or other lesion(s) by snare                         technique                        45380, 59, Colonoscopy, flexible; with biopsy, single                         or multiple Diagnosis Code(s):     --- Professional ---                        K64.0, First degree hemorrhoids                        Z86.010, Personal history of colonic polyps                        K63.5, Polyp of colon CPT copyright 2019 American Medical Association. All rights reserved. The codes documented in this report are preliminary and upon coder review may  be revised to meet current compliance requirements. Efrain Sella MD, MD 05/26/2020 11:16:18 AM This report has been signed electronically. Number of  Addenda: 0 Note Initiated On: 05/26/2020 10:41 AM Scope Withdrawal Time: 0 hours 13 minutes 4 seconds  Total Procedure Duration: 0 hours 18 minutes 23 seconds  Estimated Blood Loss:  Estimated blood loss: none. Estimated blood loss: none.      [facility]

## 2020-05-26 NOTE — Interval H&P Note (Signed)
History and Physical Interval Note:  05/26/2020 9:42 AM  Michelle Cisneros  has presented today for surgery, with the diagnosis of P HX COLON POLYPS.  The various methods of treatment have been discussed with the patient and family. After consideration of risks, benefits and other options for treatment, the patient has consented to  Procedure(s): COLONOSCOPY WITH PROPOFOL (N/A) as a surgical intervention.  The patient's history has been reviewed, patient examined, no change in status, stable for surgery.  I have reviewed the patient's chart and labs.  Questions were answered to the patient's satisfaction.     [place], [place]

## 2020-05-26 NOTE — H&P (Signed)
Outpatient short stay form Pre-procedure 05/26/2020 8:55 AM Masahiro Iglesia K. Alice Reichert, M.D.  Primary Physician: Maryland Pink, M.D.  Reason for visit:  Personal hx of sessile serrated adenoma of the colon - 2017- [place], [place].  History of present illness:                            Patient presents for colonoscopy for a personal hx of colon polyps. The patient denies abdominal pain, abnormal weight loss or rectal bleeding.     No current facility-administered medications for this encounter.  Current Outpatient Medications:  .  aspirin EC 81 MG tablet, Take 1 tablet (81 mg total) by mouth daily., Disp: 90 tablet, Rfl: 3 .  atorvastatin (LIPITOR) 10 MG tablet, TAKE ONE TABLET BY MOUTH DAILY, Disp: , Rfl:  .  buPROPion (WELLBUTRIN XL) 300 MG 24 hr tablet, TAKE ONE TABLET BY MOUTH DAILY, Disp: , Rfl:  .  Calcium Carbonate-Vitamin D 600-400 MG-UNIT tablet, Take by mouth., Disp: , Rfl:  .  lisinopril (ZESTRIL) 20 MG tablet, Take 1 tablet (20 mg total) by mouth daily., Disp: 90 tablet, Rfl: 3 .  Omega-3 Fatty Acids (FISH OIL) 1000 MG CAPS, Take 1 capsule by mouth daily., Disp: , Rfl:  .  trimethoprim (TRIMPEX) 100 MG tablet, Take 1 tablet (100 mg total) by mouth daily., Disp: 30 tablet, Rfl: 3  No medications prior to admission.     No Known Allergies   Past Medical History:  Diagnosis Date  . Arthritis   . Back pain   . Collagen vascular disease ([place])   . Depression   . Hyperlipemia   . Hypertension   . Melanoma ([place])   . Recurrent UTI     Review of systems:  Otherwise negative.    Physical Exam  Gen: Alert, oriented. Appears stated age.  HEENT: [place]/AT. PERRLA. Lungs: CTA, no wheezes. CV: RR nl S1, S2. Abd: soft, benign, no masses. BS+ Ext: No edema. Pulses 2+    Planned procedures: Proceed with colonoscopy. The patient understands the nature of the planned procedure, indications, risks, alternatives and potential complications including but not limited to bleeding,  infection, perforation, damage to internal organs and possible oversedation/side effects from anesthesia. The patient agrees and gives consent to proceed.  Please refer to procedure notes for findings, recommendations and patient disposition/instructions.     Tiaja Hagan K. Alice Reichert, M.D. Gastroenterology 05/26/2020  8:55 AM

## 2020-05-26 NOTE — Transfer of Care (Signed)
Immediate Anesthesia Transfer of Care Note  Patient: Michelle Cisneros  Procedure(s) Performed: COLONOSCOPY WITH PROPOFOL (N/A )  Patient Location: PACU  Anesthesia Type:General  Level of Consciousness: sedated  Airway & Oxygen Therapy: Patient Spontanous Breathing and Patient connected to nasal cannula oxygen  Post-op Assessment: Report given to RN and Post -op Vital signs reviewed and stable  Post vital signs: Reviewed and stable  Last Vitals:  Vitals Value Taken Time  BP 91/53 05/26/20 1115  Temp    Pulse 81 05/26/20 1115  Resp 21 05/26/20 1115  SpO2 100 % 05/26/20 1115  Vitals shown include unvalidated device data.  Last Pain:  Vitals:   05/26/20 1009  TempSrc: Temporal  PainSc: 0-No pain         Complications: No apparent anesthesia complications

## 2020-05-27 ENCOUNTER — Encounter: Payer: Self-pay | Admitting: *Deleted

## 2020-05-27 LAB — SURGICAL PATHOLOGY

## 2020-05-27 NOTE — Anesthesia Postprocedure Evaluation (Signed)
Anesthesia Post Note  Patient: Delany Steury  Procedure(s) Performed: COLONOSCOPY WITH PROPOFOL (N/A )  Patient location during evaluation: PACU Anesthesia Type: General Level of consciousness: awake and alert Pain management: pain level controlled Vital Signs Assessment: post-procedure vital signs reviewed and stable Respiratory status: spontaneous breathing, nonlabored ventilation, respiratory function stable and patient connected to nasal cannula oxygen Cardiovascular status: blood pressure returned to baseline and stable Postop Assessment: no apparent nausea or vomiting Anesthetic complications: no     Last Vitals:  Vitals:   05/26/20 1135 05/26/20 1145  BP: 113/75 129/79  Pulse: 70 68  Resp: 17 (!) 21  Temp:    SpO2: 100% 98%    Last Pain:  Vitals:   05/26/20 1145  TempSrc:   PainSc: 0-No pain                 Molli Barrows

## 2020-06-02 NOTE — Progress Notes (Signed)
Follow-up Outpatient Visit Date: 06/04/2020  Primary Care Provider: Maryland Pink, MD [place] [address]  Chief Complaint: Follow-up heart testing  HPI:  Michelle Cisneros is a 64 y.o. female with history of aortic atherosclerosis, hypertension, hyperlipidemia, skin cancers, and back pain, who presents for follow-up of atherosclerosis.  I met her in late April after CT scan ordered as part of her back pain evaluation revealed mild aortoiliac calcification.  Subsequent echo and myocardial perfusion stress test were normal.  Today, Michelle Cisneros reports that she has been doing relatively well.  She has experienced occasional swelling along the insteps of both feet.  She has not had any ankle edema.  She denies chest pain, shortness of breath, palpitations, and lightheadedness.  Home blood pressures are usually in the 120s over 70s.  --------------------------------------------------------------------------------------------------  Cardiovascular History & Procedures: Cardiovascular Problems:  Atherosclerosis  Risk Factors:  Atherosclerosis, hypertension, age greater than 89, and prior tobacco use  Cath/PCI:  None  CV Surgery:  None  EP Procedures and Devices:  None  Non-Invasive Evaluation(s):  TTE (6/3/20201): Technically difficult study.  LVEF 60-65% with normal diastolic function.  Normal RV size and function.  No significant valvular abnormality.  Pharmacologic MPI (05/05/2020): Normal study without ischemia or scar.  LVEF 55-65%.  Recent CV Pertinent Labs: Lab Results  Component Value Date   K 4.2 05/22/2020   BUN 17 05/22/2020   CREATININE 0.86 05/22/2020    Past medical and surgical history were reviewed and updated in EPIC.  Current Meds  Medication Sig  . aspirin EC 81 MG tablet Take 1 tablet (81 mg total) by mouth daily.  Marland Kitchen atorvastatin (LIPITOR) 10 MG tablet TAKE ONE TABLET BY MOUTH DAILY  . buPROPion (WELLBUTRIN  XL) 300 MG 24 hr tablet TAKE ONE TABLET BY MOUTH DAILY  . Calcium Carbonate-Vitamin D 600-400 MG-UNIT tablet Take by mouth.  . fluticasone (FLONASE) 50 MCG/ACT nasal spray Place 1 spray into both nostrils 2 (two) times daily.  Marland Kitchen lisinopril (ZESTRIL) 20 MG tablet Take 1 tablet (20 mg total) by mouth daily.  . Omega-3 Fatty Acids (FISH OIL) 1000 MG CAPS Take 1 capsule by mouth daily.  Marland Kitchen trimethoprim (TRIMPEX) 100 MG tablet Take 1 tablet (100 mg total) by mouth daily.    Allergies: Patient has no known allergies.  Social History   Tobacco Use  . Smoking status: Former Smoker    Packs/day: 1.00    Years: 30.00    Pack years: 30.00    Types: Cigarettes    Quit date: 10/04/1987    Years since quitting: 32.6  . Smokeless tobacco: Never Used  Vaping Use  . Vaping Use: Never used  Substance Use Topics  . Alcohol use: Yes    Comment: occasional; once glass of wine per month  . Drug use: Not Currently    Family History  Problem Relation Age of Onset  . Cancer Father        laryngeal  . Atrial fibrillation Father   . Stroke Mother   . Breast cancer Neg Hx   . Bladder Cancer Neg Hx   . Kidney cancer Neg Hx     Review of Systems: A 12-system review of systems was performed and was negative except as noted in the HPI.  --------------------------------------------------------------------------------------------------  Physical Exam: BP 140/82 (BP Location: Left Arm, Patient Position: Sitting, Cuff Size: Normal)   Pulse 64   Ht '5\' 6"'$  (1.676 m)   Wt 192 lb  6 oz (87.3 kg)   SpO2 96%   BMI 31.05 kg/m   General: NAD. Neck: No JVD or HJR. Lungs: Clear to auscultation bilaterally without wheezes or crackles. Heart: Regular rate and rhythm without murmurs, rubs, or gallops. Abdomen: Soft, nontender, nondistended. Extremities: No lower extremity edema.  2+ pedal pulses.  EKG: Normal sinus rhythm with borderline LVH.  No significant abnormality.   Lab Results  Component Value  Date   NA 139 05/22/2020   K 4.2 05/22/2020   CL 103 05/22/2020   CO2 26 05/22/2020   BUN 17 05/22/2020   CREATININE 0.86 05/22/2020   GLUCOSE 115 (H) 05/22/2020    --------------------------------------------------------------------------------------------------  ASSESSMENT AND PLAN: Aortic atherosclerosis: Incidentally noted on cross-sectional imaging of the abdomen and pelvis.  Michelle Cisneros has not had any further chest pain or significant shortness of breath.  Recent echo and myocardial perfusion stress test were reassuring.  No further work-up is recommended.  I have encouraged Michelle Cisneros to work on risk factor modification and medical therapy to prevent progression of her atherosclerosis.  Hypertension: Blood pressure mildly elevated today but typically better at home.  We will continue lisinopril 20 mg daily, with ongoing management per Dr. Kary Kos.  Hyperlipidemia: Continue low-dose atorvastatin to prevent progression of aortic atherosclerosis.  Ongoing management per Dr. Kary Kos.  Follow-up: Return to clinic as needed.  Michelle Bush, MD 06/04/2020 11:49 AM

## 2020-06-04 ENCOUNTER — Ambulatory Visit: Payer: PPO | Admitting: Internal Medicine

## 2020-06-04 ENCOUNTER — Encounter: Payer: Self-pay | Admitting: Internal Medicine

## 2020-06-04 ENCOUNTER — Other Ambulatory Visit: Payer: Self-pay

## 2020-06-04 VITALS — BP 140/82 | HR 64 | Ht 66.0 in | Wt 192.4 lb

## 2020-06-04 DIAGNOSIS — E785 Hyperlipidemia, unspecified: Secondary | ICD-10-CM

## 2020-06-04 DIAGNOSIS — I10 Essential (primary) hypertension: Secondary | ICD-10-CM

## 2020-06-04 DIAGNOSIS — I700 Atherosclerosis of aorta: Secondary | ICD-10-CM | POA: Diagnosis not present

## 2020-06-04 NOTE — Patient Instructions (Signed)

## 2020-06-05 ENCOUNTER — Encounter: Payer: Self-pay | Admitting: Internal Medicine

## 2020-06-06 IMAGING — MG DIGITAL SCREENING BILAT W/ TOMO W/ CAD
8 of 14 series · 8 of 40 positions shown · non-contrast
Comparison: Previous exam(s).

CLINICAL DATA: Screening.

EXAM:
DIGITAL SCREENING BILATERAL MAMMOGRAM WITH TOMO AND CAD

[L CC synth-2D (1 of 3)]
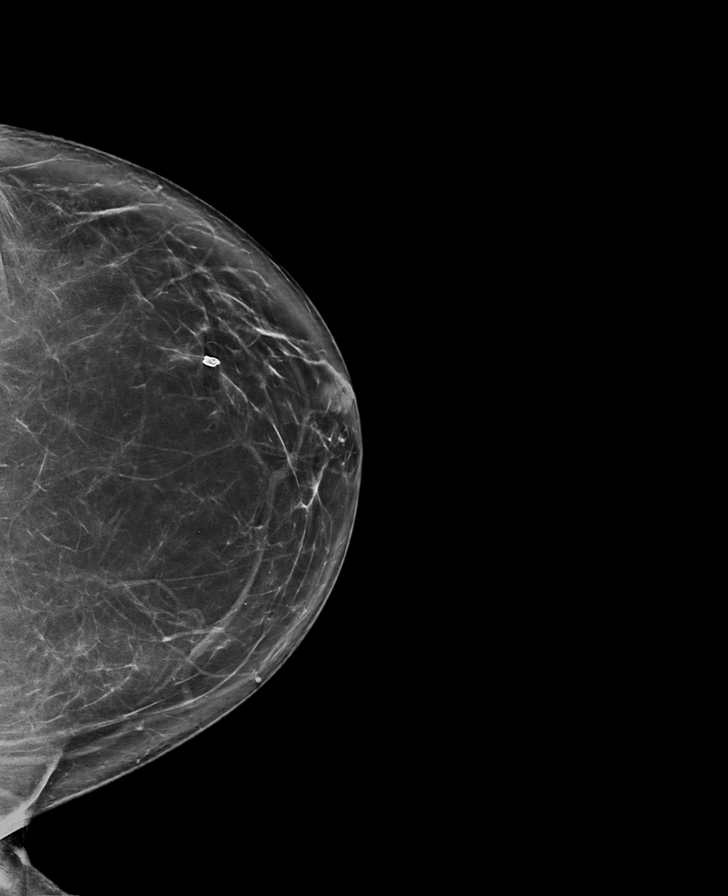

[L MLO synth-2D]
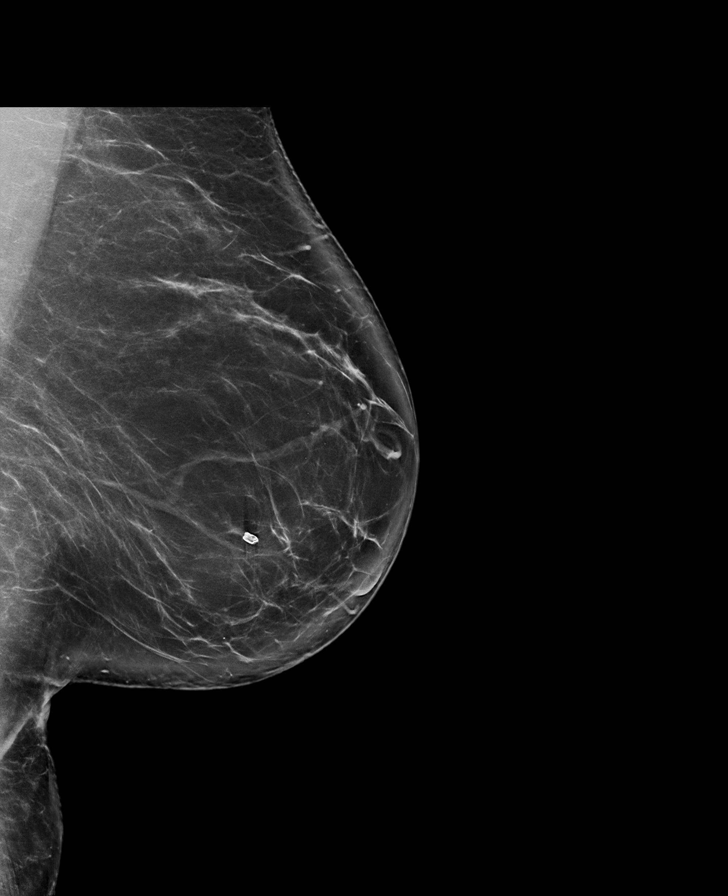

[R CC synth-2D (1 of 2)]
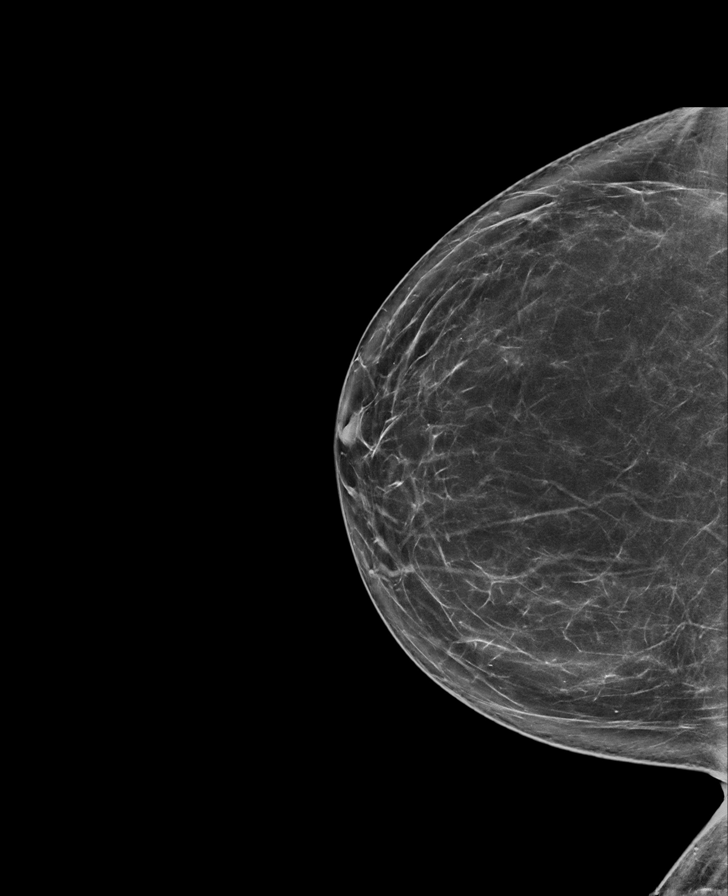

[R MLO synth-2D]
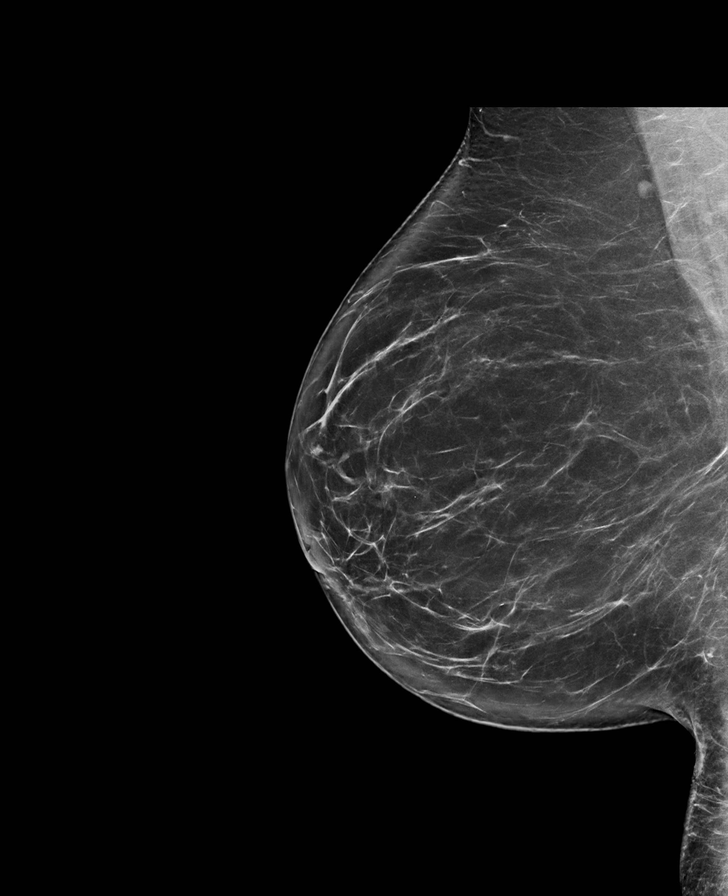

[R CC synth-2D (2 of 2)]
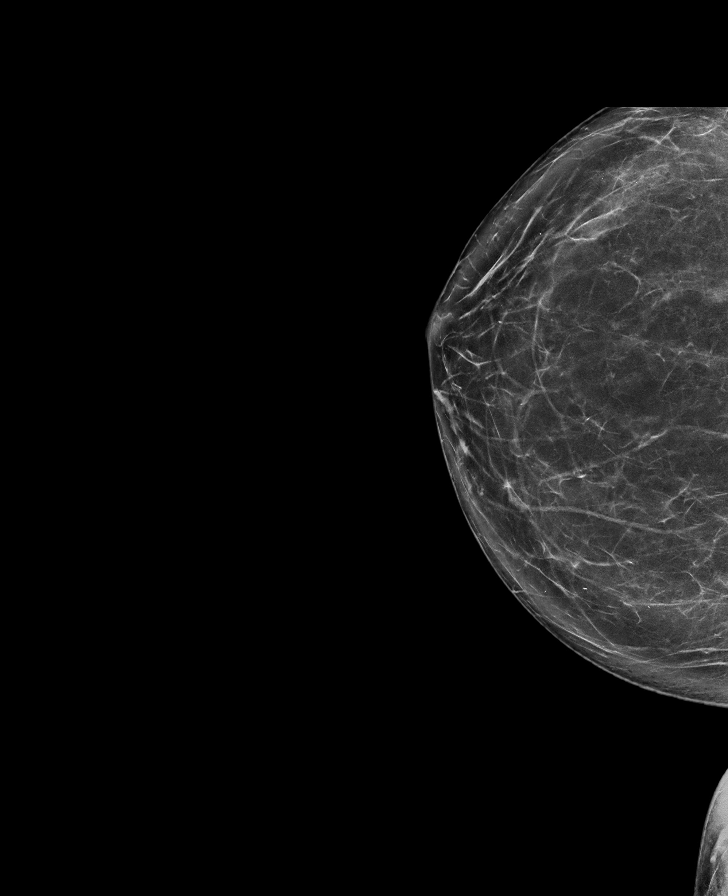

[L CC synth-2D (2 of 3)]
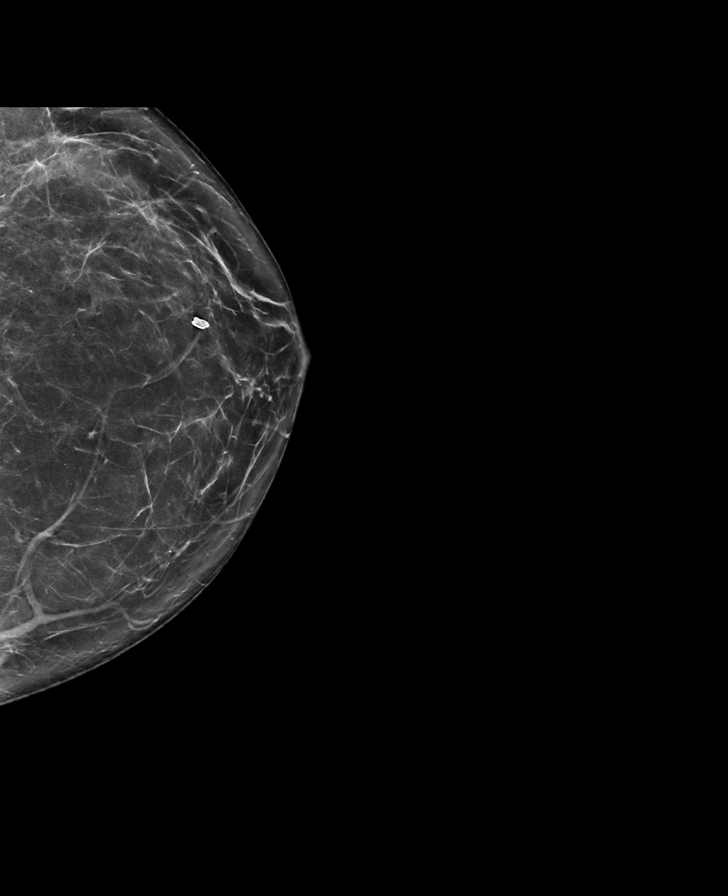

[L CC synth-2D (3 of 3)]
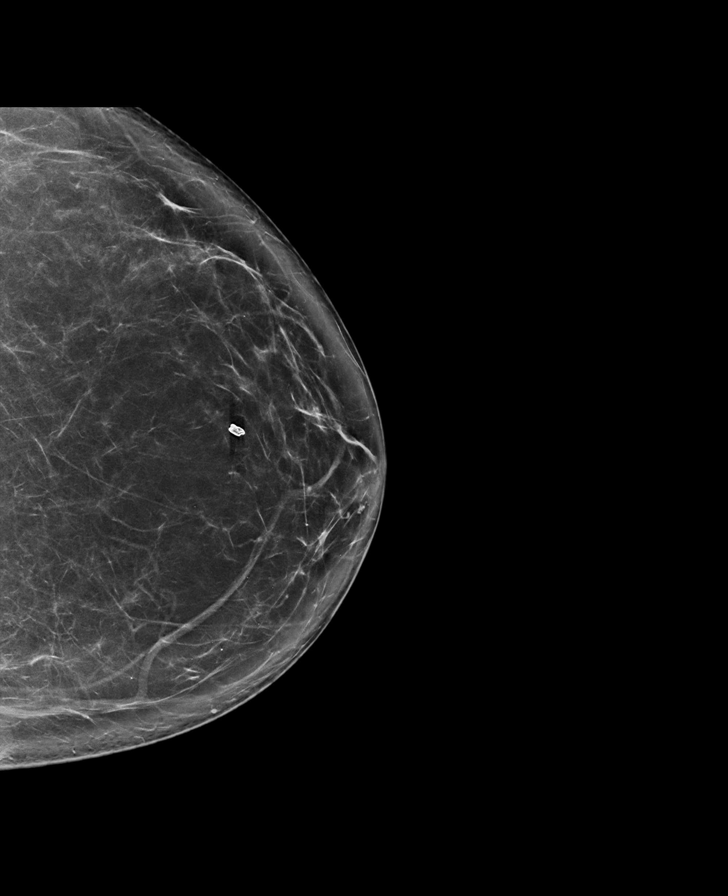

[L CC tomo · tomo slice 41/82.0]
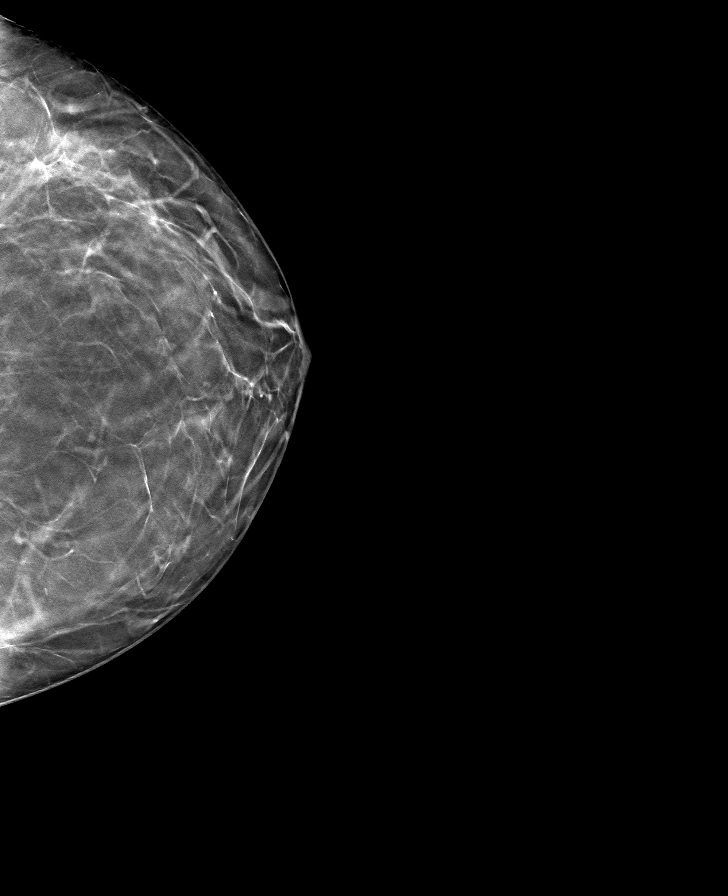

[8 of 40 positions shown; findings below may reference images not displayed]

ACR Breast Density Category b: There are scattered areas of
fibroglandular density.
FINDINGS: There are no findings suspicious for malignancy. Images were
processed with CAD.
IMPRESSION: No mammographic evidence of malignancy. A result letter of this
screening mammogram will be mailed directly to the patient.

RECOMMENDATION:
Screening mammogram in one year. (Code:[id number])

BI-RADS CATEGORY  1: Negative.

## 2020-07-08 ENCOUNTER — Encounter: Payer: Self-pay | Admitting: Physical Therapy

## 2020-07-08 ENCOUNTER — Ambulatory Visit: Payer: PPO | Attending: Family Medicine | Admitting: Physical Therapy

## 2020-07-08 ENCOUNTER — Other Ambulatory Visit: Payer: Self-pay

## 2020-07-08 DIAGNOSIS — M545 Low back pain, unspecified: Secondary | ICD-10-CM

## 2020-07-08 DIAGNOSIS — R293 Abnormal posture: Secondary | ICD-10-CM | POA: Insufficient documentation

## 2020-07-08 DIAGNOSIS — M5386 Other specified dorsopathies, lumbar region: Secondary | ICD-10-CM | POA: Diagnosis not present

## 2020-07-08 DIAGNOSIS — G8929 Other chronic pain: Secondary | ICD-10-CM | POA: Diagnosis not present

## 2020-07-08 DIAGNOSIS — R269 Unspecified abnormalities of gait and mobility: Secondary | ICD-10-CM | POA: Insufficient documentation

## 2020-07-08 NOTE — Therapy (Signed)
[place] [facility] [address], [place], [address] Phone: [phone]   Fax:  [phone]  Physical Therapy Evaluation  Patient Details  Name: Michelle Cisneros MRN: [id number] Date of Birth: Aug 23, 1956 No data recorded  Encounter Date: 07/08/2020   PT End of Session - 07/08/20 1508    Visit Number 1    Number of Visits 16    Date for PT Re-Evaluation 09/02/20    PT Start Time 0950    PT Stop Time 1050    PT Time Calculation (min) 60 min    Activity Tolerance Patient tolerated treatment well;No increased pain    Behavior During Therapy WFL for tasks assessed/performed           Past Medical History:  Diagnosis Date  . Arthritis   . Back pain   . Collagen vascular disease ([place])   . Depression   . Hyperlipemia   . Hypertension   . Melanoma ([place])   . Recurrent UTI     Past Surgical History:  Procedure Laterality Date  . ABDOMINAL HYSTERECTOMY    . COLONOSCOPY WITH PROPOFOL N/A 05/26/2020   Procedure: COLONOSCOPY WITH PROPOFOL;  Surgeon: Toledo, Benay Pike, MD;  Location: [facility] ENDOSCOPY;  Service: Gastroenterology;  Laterality: N/A;  . INCONTINENCE SURGERY    . OOPHORECTOMY      There were no vitals filed for this visit.    Subjective Assessment - 07/08/20 1514    Pertinent History Pt is a 64 y.o. retired female referred for chronic LBP.  Pt is a year post L1 compression fracture d/t a fall from a stool onto her back (May 2020) which was the initial onset of her back pain.  Pt received PT and her condition significantly improved; since leaving PT, pt reports her LBP has been worsening and she has had little motivation to do her exercises at home.  Pt describes pain as a tiring, fatigue-like pain (Worst: 5/10, Best/Current: 0/10); reporting that it feels as if her "frame is collapsing" when she walks.  Aggravating factors include walking, standing, carrying her grandkids (3 of her 4 grandkids are <4 y.o.),  floor transfers, and stairs.  Easing factors include sitting, laying down, and pain medications (advil) as needed on days that she plans to move more.  Pt has a hx of LBP and stenosis noted on imaging taken on 06/04/2019.  Pt reports sx are localized in her low back with no radiating sx and no reports of N/T; hx of sciatica noted after a fall in her 41s but has since resolved.  Pt reports a high fear of falling and avoidance of many activities d/t condition.  Prior to injury, pt was a marathon walker and walked 5 miles a day and did yoga 2-3x a week.  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  Pt denies N/V, B&B changes, unexplained weight fluctuation, saddle paresthesia, fever, night sweats, or unrelenting night pain at this time.    Limitations Standing;Walking;House hold activities    How long can you sit comfortably? Unlimited    How long can you stand comfortably? 5-10 minutes    How long can you walk comfortably? 5 minutes    Patient Stated Goals ability to walk 30 minutes, strengthening my core    Currently in Pain? No/denies    Pain Location Back    Pain Orientation Lower  Pain Descriptors / Indicators Tiring;Other (Comment)   Fatigue; feels like her "frame is collapsing"   Pain Type Chronic pain    Pain Onset More than a month ago    Pain Frequency Intermittent    Aggravating Factors  standing up straight/posture, walking (feels like frame is collapsing), moving, picking up/holding and walking with her grandkids (< age 75), stairs    Pain Relieving Factors sitting, laying down, advil/pain medication    Effect of Pain on Daily Activities Worse with movement             OBJECTIVE  Mental Status Patient is oriented to person, place and time.  Recent memory is intact.  Remote memory is intact.  Attention span and concentration are intact.  Expressive speech is intact.  Patient's fund of  knowledge is within normal limits for educational level.  SENSATION: Not formally assessed; seems intact per pt report. Proprioception and hot/cold testing deferred on this date   MUSCULOSKELETAL: Tremor: None Bulk: Normal Tone: Normal No visible step-off along spinal column  Posture Trunk flexed posture, limited lumbar lordosis, lower crossed, forward head, rounded shoulders  Gait Amb with forward trunk flexion, reduced lumbar lordosis, L trendelenburg, reduced hip extension   Palpation Palpation to lumbar paraspinals and superior glute max/med lateral to sacrum and along ilium reveals tightness, no tenderness or concordant pain noted  Strength (out of 5) R/L 4+/4 Hip flexion 4+/4+ Hip ER 4+/4+ Hip IR 5/5 Hip abduction 5/5 Hip adduction 5/4 Hip extension 5/5 Knee extension 5/5 Knee flexion 5/5 Ankle dorsiflexion 5/5 Ankle plantarflexion   *Indicates pain   AROM (degrees) R/L (all movements include overpressure unless otherwise stated) Lumbar forward flexion (65): Unlimited Lumbar extension (30): 100% limited Lumbar lateral flexion (25): Limited 25% B Thoracic and Lumbar rotation (30 degrees):  Limited 25% B Hip IR (0-45): WNL Hip ER (0-45):  20d B Hip Flexion (0-125): WNL Hip Abduction (0-40): WNL Hip extension (0-15): WNL *Indicates pain  PROM (degrees) PROM = AROM R/L (all movements include overpressure unless otherwise stated) Lumbar forward flexion (65 degrees):  Lumbar extension (30 degrees): Lumbar lateral flexion (25 degrees): R: degrees L: degrees Thoracic and Lumbar rotation (30 degrees):  R: degrees L: degrees   Muscle Length Hamstrings: Positive B Ely: Positive B Thomas: Positive Ober: Negative B   Passive Accessory Intervertebral Motion (PAIVM) Pt denies reproduction of back pain with CPA L1-L5 and UPA bilaterally L1-L5.  Some radiating pain in the L hip with L UPA to L1-2.  Generally hypomobile throughout.  Passive Physiological  Intervertebral Motion (PPIVM) Normal flexion and extension with PPIVM testing   SPECIAL TESTS Hip: FABER: Negative B FADIR: Negative B  Piriformis Syndrome: FAIR Test: Negative B  Functional Tasks Squatting: Knee valgus, knee flexion over toes, knee pain noted   Sit to stand: Use of UE support, able to stand without; knee valgus noted during transfer  Stairs: Reciprocal pattern with ascend and unilat UE support for balance and difficulty with LLE step up d/t L knee pain; step to with descend leading with L and unilat UE support   Tandem standing: able to maintain for >15 sec with minimal sway and appropriate use of ankle rockers  SLS: 10 sec on R, 5 sec on L  6MWT: 1346ft with increased fatigue pain at 5 minutes  5xSTS: 16 seconds  Gait speed: 1 m/s   TherEx: Glute bridge x10 with cueing for core and glute activation Supine hip flexor stretch x30 sec B Mini squat x10 with  cueing for hip hinge and neutral knee alignment for reduced knee valgus SLS x30 sec   Objective measurements completed on examination: See above findings.      PT Education - 07/08/20 1504    Education Details Patient was educated on diagnosis, anatomy and pathology involved, prognosis, role of PT, and was given an HEP, demonstrating exercise with proper form following verbal and tactile cues, and was given a paper hand out to continue exercise at home. Pt was educated on and agreed to plan of care.    Person(s) Educated Patient    Methods Explanation;Demonstration;Verbal cues;Handout;Tactile cues    Comprehension Verbalized understanding;Returned demonstration;Verbal cues required;Need further instruction;Tactile cues required            PT Short Term Goals - 07/08/20 1622      PT SHORT TERM GOAL #1   Title Pt will be independent with HEP in order to improve strength and decrease back pain in order to improve pain-free function at home and work.    Baseline 07/08/20 Educated on HEP and handout  given    Time 4    Period Weeks    Status New    Target Date 08/05/20             PT Long Term Goals - 07/08/20 1625      PT LONG TERM GOAL #1   Title Pt will increase FOTO score to 57 to indicate improved funcitonal mobility with ADLs.    Baseline 07/08/20 FOTO 47    Time 8    Period Weeks    Status New    Target Date 09/02/20      PT LONG TERM GOAL #2   Title Pt will decrease worst back pain as reported on NPRS by at least 2 points in order to demonstrate clinically significant reduction in back pain.    Baseline 07/08/20 Worst 5/10    Time 8    Period Weeks    Status New      PT LONG TERM GOAL #3   Title Pt will be able to ambulate for 30 minutes with pain <3/10 for improved ambulation endurance with less pain.    Baseline 07/08/20 Amb brings on worst pain 5/10, pain onsets at 5 minutes    Time 8    Period Weeks    Status New    Target Date 09/02/20      PT LONG TERM GOAL #4   Title Pt will decrease 5xSTS to 12 seconds or less to demonstrate signficantly improved LE strength similar to age matched norms.    Baseline 07/08/20 5xSTS 16 sec    Time 8    Period Weeks    Status New    Target Date 09/02/20      PT LONG TERM GOAL #5   Title Pt will increase gait speed to at least 1.21m/s for full community ambulation.    Baseline 07/08/20 43m/s    Time 8    Period Weeks    Status New                  Plan - 07/08/20 1509    Clinical Impression Statement Pt is a pleasant 64 y.o. female referred for persistent low back pain one year post L1 compression fracture.  PT examination reveals deficits in lumbar mobility and decreased hip/core strength consistent with inactivity post injury.  Pt presents with deficits in lumbar mobility and ROM, hip/core strength, lumbar pain, and posture.  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  Pt will benefit from skilled  PT services to address deficits and return to pain-free function at home and in the community.    Personal Factors and Comorbidities Age;Comorbidity 1    Comorbidities HTN    Examination-Activity Limitations Stand;Transfers;Patent attorney for Others;Squat;Stairs    Examination-Participation Restrictions Community Activity;Interpersonal Relationship;Shop;Cleaning    Stability/Clinical Decision Making Evolving/Moderate complexity    Clinical Decision Making Moderate    Rehab Potential Good    PT Frequency 2x / week    PT Duration 6 weeks    PT Treatment/Interventions ADLs/Self Care Home Management;Cryotherapy;Electrical Stimulation;Moist Heat;Traction;Gait training;Stair training;Functional mobility training;Therapeutic activities;Therapeutic exercise;Patient/family education;Cognitive remediation;Neuromuscular re-education;Balance training;Manual techniques    PT Next Visit Plan Lumbar mobility, core/hip strengthening    PT Home Exercise Plan glute bridge, hip flexor stretch, mini squat, SLS    Consulted and Agree with Plan of Care Patient           Patient will benefit from skilled therapeutic intervention in order to improve the following deficits and impairments:  Abnormal gait, Improper body mechanics, Pain, Decreased coordination, Decreased mobility, Postural dysfunction, Decreased activity tolerance, Decreased endurance, Decreased range of motion, Decreased strength, Hypomobility, Impaired perceived functional ability, Difficulty walking, Impaired flexibility  Visit Diagnosis: Chronic midline low back pain without sciatica  Decreased ROM of lumbar spine  Abnormal posture  Abnormality of gait and mobility     Problem List Patient Active Problem List   Diagnosis Date Noted  . Dyspnea on exertion 04/19/2020  . Aortic atherosclerosis ([place]) 04/19/2020  . Essential hypertension 04/19/2020  . Hyperlipidemia 04/19/2020  . Right carpal tunnel syndrome 11/09/2016     Durwin Reges DPT Chinita Greenland, SPT Durwin Reges 07/08/2020, 6:31 PM  [facility] [place] [facility] [address], [place], [address] Phone: [phone]   Fax:  [phone]  Name: Michelle Cisneros MRN: [id number] Date of Birth: [birth year]

## 2020-07-10 ENCOUNTER — Other Ambulatory Visit: Payer: Self-pay

## 2020-07-10 ENCOUNTER — Ambulatory Visit: Payer: PPO | Admitting: Physical Therapy

## 2020-07-10 ENCOUNTER — Encounter: Payer: Self-pay | Admitting: Physical Therapy

## 2020-07-10 DIAGNOSIS — R293 Abnormal posture: Secondary | ICD-10-CM

## 2020-07-10 DIAGNOSIS — R269 Unspecified abnormalities of gait and mobility: Secondary | ICD-10-CM

## 2020-07-10 DIAGNOSIS — G8929 Other chronic pain: Secondary | ICD-10-CM

## 2020-07-10 DIAGNOSIS — M545 Low back pain: Secondary | ICD-10-CM | POA: Diagnosis not present

## 2020-07-10 DIAGNOSIS — M5386 Other specified dorsopathies, lumbar region: Secondary | ICD-10-CM

## 2020-07-10 NOTE — Therapy (Signed)
[place] [facility] [address] [place], [address] Phone: [phone]   Fax:  [phone]  Physical Therapy Treatment  Patient Details  Name: Michelle Cisneros MRN: [id number] Date of Birth: 1956-06-23 No data recorded  Encounter Date: 07/10/2020   PT End of Session - 07/10/20 1013    Visit Number 2    Number of Visits 16    Date for PT Re-Evaluation 09/02/20    PT Start Time 0950    PT Stop Time 1028    PT Time Calculation (min) 38 min    Activity Tolerance Patient tolerated treatment well;No increased pain    Behavior During Therapy WFL for tasks assessed/performed           Past Medical History:  Diagnosis Date  . Arthritis   . Back pain   . Collagen vascular disease ([place])   . Depression   . Hyperlipemia   . Hypertension   . Melanoma ([place])   . Recurrent UTI     Past Surgical History:  Procedure Laterality Date  . ABDOMINAL HYSTERECTOMY    . COLONOSCOPY WITH PROPOFOL N/A 05/26/2020   Procedure: COLONOSCOPY WITH PROPOFOL;  Surgeon: Toledo, Benay Pike, MD;  Location: [facility] ENDOSCOPY;  Service: Gastroenterology;  Laterality: N/A;  . INCONTINENCE SURGERY    . OOPHORECTOMY      There were no vitals filed for this visit.   Subjective Assessment - 07/10/20 0954    Subjective Pt reports she is feeling a little bit better overall this morning, that she has been going to the gym and using some machines and walking on the treadmill for x16mins and felt good following. She has some questions about HEP. Reports no pain this am.    Pertinent History Pt is a 63 y.o. retired female referred for chronic LBP.  Pt is a year post L1 compression fracture d/t a fall from a stool onto her back (May 2020) which was the initial onset of her back pain.  Pt received PT and her condition significantly improved; since leaving PT, pt reports her LBP has been worsening and she has had little motivation to do her exercises at home.   Pt describes pain as a tiring, fatigue-like pain (Worst: 5/10, Best/Current: 0/10); reporting that it feels as if her "frame is collapsing" when she walks.  Aggravating factors include walking, standing, carrying her grandkids (3 of her 4 grandkids are <4 y.o.), floor transfers, and stairs.  Easing factors include sitting, laying down, and pain medications (advil) as needed on days that she plans to move more.  Pt has a hx of LBP and stenosis noted on imaging taken on 06/04/2019.  Pt reports sx are localized in her low back with no radiating sx and no reports of N/T; hx of sciatica noted after a fall in her 45s but has since resolved.  Pt reports a high fear of falling and avoidance of many activities d/t condition.  Prior to injury, pt was a marathon walker and walked 5 miles a day and did yoga 2-3x a week.  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  Pt denies N/V, B&B changes, unexplained weight fluctuation, saddle paresthesia, fever, night sweats, or unrelenting night pain at this time.    Limitations Standing;Walking;House hold activities    How long can you sit comfortably? Unlimited    How long can you  stand comfortably? 5-10 minutes    How long can you walk comfortably? 5 minutes    Patient Stated Goals ability to walk 30 minutes, strengthening my core    Pain Onset More than a month ago               Ther-Ex - Nustep L3 UE setting 8; LE 7 54mins for gentle rotation and low grade strengthening/endurance  - Bridge x10 with min cuing for glute set prior to increase glute activation with good carry over; with RTB for abd resistance 2x 10 with good carry over of technique - Mini squat with counter support 3x10 with heavy multi-modal cuing and demo needed for proper technique with core and hip ext activation with good carry over by last set - Prone alt hip ext x10; Prone alt superman 2x 10  - SLS with  decreased support needed for LLE balance - Supine hip flexor stretch 30sec bilat with min cuing for neutral spine with good carry over                         PT Education - 07/10/20 1005    Education Details HEP review; therex form/technique    Person(s) Educated Patient    Methods Explanation;Demonstration;Verbal cues    Comprehension Verbalized understanding;Returned demonstration;Verbal cues required            PT Short Term Goals - 07/08/20 1622      PT SHORT TERM GOAL #1   Title Pt will be independent with HEP in order to improve strength and decrease back pain in order to improve pain-free function at home and work.    Baseline 07/08/20 Educated on HEP and handout given    Time 4    Period Weeks    Status New    Target Date 08/05/20             PT Long Term Goals - 07/08/20 1625      PT LONG TERM GOAL #1   Title Pt will increase FOTO score to 57 to indicate improved funcitonal mobility with ADLs.    Baseline 07/08/20 FOTO 47    Time 8    Period Weeks    Status New    Target Date 09/02/20      PT LONG TERM GOAL #2   Title Pt will decrease worst back pain as reported on NPRS by at least 2 points in order to demonstrate clinically significant reduction in back pain.    Baseline 07/08/20 Worst 5/10    Time 8    Period Weeks    Status New      PT LONG TERM GOAL #3   Title Pt will be able to ambulate for 30 minutes with pain <3/10 for improved ambulation endurance with less pain.    Baseline 07/08/20 Amb brings on worst pain 5/10, pain onsets at 5 minutes    Time 8    Period Weeks    Status New    Target Date 09/02/20      PT LONG TERM GOAL #4   Title Pt will decrease 5xSTS to 12 seconds or less to demonstrate signficantly improved LE strength similar to age matched norms.    Baseline 07/08/20 5xSTS 16 sec    Time 8    Period Weeks    Status New    Target Date 09/02/20      PT LONG TERM GOAL #5   Title Pt will increase gait  speed to at  least 1.44m/s for full community ambulation.    Baseline 07/08/20 7m/s    Time 8    Period Weeks    Status New                 Plan - 07/10/20 1030    Clinical Impression Statement PT initiated therex progression for increased spine mobility and hip/core strength and stability with good success. Patietn requires demo and multi-modal cuing for functional therex/ movements, and is motivated and able to comply with proper techniques. PT reviewed HEP with minor corrections needed. PT will continue progression as able.    Personal Factors and Comorbidities Age;Comorbidity 1    Comorbidities HTN    Examination-Activity Limitations Stand;Transfers;Patent attorney for Others;Squat;Stairs    Examination-Participation Restrictions Community Activity;Interpersonal Relationship;Shop;Cleaning    Stability/Clinical Decision Making Evolving/Moderate complexity    Clinical Decision Making Moderate    Rehab Potential Good    PT Frequency 2x / week    PT Duration 6 weeks    PT Treatment/Interventions ADLs/Self Care Home Management;Cryotherapy;Electrical Stimulation;Moist Heat;Traction;Gait training;Stair training;Functional mobility training;Therapeutic activities;Therapeutic exercise;Patient/family education;Cognitive remediation;Neuromuscular re-education;Balance training;Manual techniques    PT Next Visit Plan Lumbar mobility, core/hip strengthening    PT Home Exercise Plan glute bridge, hip flexor stretch, mini squat, SLS    Consulted and Agree with Plan of Care Patient           Patient will benefit from skilled therapeutic intervention in order to improve the following deficits and impairments:  Abnormal gait, Improper body mechanics, Pain, Decreased coordination, Decreased mobility, Postural dysfunction, Decreased activity tolerance, Decreased endurance, Decreased range of motion, Decreased strength, Hypomobility, Impaired perceived functional ability, Difficulty walking, Impaired  flexibility  Visit Diagnosis: Chronic midline low back pain without sciatica  Decreased ROM of lumbar spine  Abnormal posture  Abnormality of gait and mobility     Problem List Patient Active Problem List   Diagnosis Date Noted  . Dyspnea on exertion 04/19/2020  . Aortic atherosclerosis ([place]) 04/19/2020  . Essential hypertension 04/19/2020  . Hyperlipidemia 04/19/2020  . Right carpal tunnel syndrome 11/09/2016   Durwin Reges DPT Durwin Reges 07/10/2020, 10:37 AM  [facility] [address] [place], [address] Phone: [phone]   Fax:  [phone]  Name: Michelle Cisneros MRN: [id number] Date of Birth: 04/17/1956

## 2020-07-15 ENCOUNTER — Other Ambulatory Visit: Payer: Self-pay

## 2020-07-15 ENCOUNTER — Ambulatory Visit: Payer: PPO | Admitting: Physical Therapy

## 2020-07-15 ENCOUNTER — Encounter: Payer: Self-pay | Admitting: Physical Therapy

## 2020-07-15 DIAGNOSIS — M5386 Other specified dorsopathies, lumbar region: Secondary | ICD-10-CM

## 2020-07-15 DIAGNOSIS — R293 Abnormal posture: Secondary | ICD-10-CM

## 2020-07-15 DIAGNOSIS — M545 Low back pain, unspecified: Secondary | ICD-10-CM

## 2020-07-15 DIAGNOSIS — R269 Unspecified abnormalities of gait and mobility: Secondary | ICD-10-CM

## 2020-07-15 NOTE — Therapy (Signed)
[facility] [address], [place], [address] Phone: [phone]   Fax:  [phone]  Physical Therapy Treatment  Patient Details  Name: Michelle Cisneros MRN: [id number] Date of Birth: [birth year] No data recorded  Encounter Date: 07/15/2020   PT End of Session - 07/15/20 1315    Visit Number 3    Number of Visits 16    Date for PT Re-Evaluation 09/02/20    PT Start Time 7564    PT Stop Time 1345    PT Time Calculation (min) 40 min    Activity Tolerance Patient tolerated treatment well;No increased pain    Behavior During Therapy WFL for tasks assessed/performed           Past Medical History:  Diagnosis Date  . Arthritis   . Back pain   . Collagen vascular disease ([place])   . Depression   . Hyperlipemia   . Hypertension   . Melanoma ([place])   . Recurrent UTI     Past Surgical History:  Procedure Laterality Date  . ABDOMINAL HYSTERECTOMY    . COLONOSCOPY WITH PROPOFOL N/A 05/26/2020   Procedure: COLONOSCOPY WITH PROPOFOL;  Surgeon: Toledo, Benay Pike, MD;  Location: [facility] ENDOSCOPY;  Service: Gastroenterology;  Laterality: N/A;  . INCONTINENCE SURGERY    . OOPHORECTOMY      There were no vitals filed for this visit.   Subjective Assessment - 07/15/20 1305    Subjective Pt reports no pain today and has been walking 15-20 minutes everyday on outdoor surfaces and/or on the treadmill at the gym.  Pt reports experienced some dizziness with glute bridges at home; some compliance with HEP.  Pt reports feelings as though she has gained more strength and is less out of breath with activity.    Pertinent History Pt is a 64 y.o. retired female referred for chronic LBP.  Pt is a year post L1 compression fracture d/t a fall from a stool onto her back (May 2020) which was the initial onset of her back pain.  Pt received PT and her condition significantly improved; since leaving PT, pt reports her LBP has been  worsening and she has had little motivation to do her exercises at home.  Pt describes pain as a tiring, fatigue-like pain (Worst: 5/10, Best/Current: 0/10); reporting that it feels as if her "frame is collapsing" when she walks.  Aggravating factors include walking, standing, carrying her grandkids (3 of her 4 grandkids are <4 y.o.), floor transfers, and stairs.  Easing factors include sitting, laying down, and pain medications (advil) as needed on days that she plans to move more.  Pt has a hx of LBP and stenosis noted on imaging taken on 06/04/2019.  Pt reports sx are localized in her low back with no radiating sx and no reports of N/T; hx of sciatica noted after a fall in her 37s but has since resolved.  Pt reports a high fear of falling and avoidance of many activities d/t condition.  Prior to injury, pt was a marathon walker and walked 5 miles a day and did yoga 2-3x a week.  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  Pt denies N/V, B&B changes, unexplained weight fluctuation, saddle paresthesia, fever, night sweats, or unrelenting night pain at this time.    Limitations Standing;Walking;House hold activities    How long can you  sit comfortably? Unlimited    How long can you stand comfortably? 5-10 minutes    How long can you walk comfortably? 5 minutes    Patient Stated Goals ability to walk 30 minutes, strengthening my core    Currently in Pain? No/denies    Pain Onset More than a month ago           Ther-Ex - Nustep L3 UE setting 8; LE 7 75mins for gentle rotation and low grade strengthening/endurance  - Bridge x10 with min cuing for glute set prior to increase glute activation with good carry over; with RTB for abd resistance 2x 10 with good carry over of technique - Clamshell with RTB x10. GTB 2x10 with cueing for eccentric control; pt reports dizziness with positional changes, subsides after 5  minutes - Quadruped alt hip ext x10 - Quadruped alt bird dog 2x10  - Child's pose 2x30 min - Mini squat with BUE support 3x10 with multi-modal cuing for proper technique with hip hinge and reduced knee flexion over toes    PT Education - 07/15/20 1310    Education Details Therex form/technique    Person(s) Educated Patient    Methods Explanation;Demonstration;Verbal cues    Comprehension Verbalized understanding;Returned demonstration;Verbal cues required            PT Short Term Goals - 07/08/20 1622      PT SHORT TERM GOAL #1   Title Pt will be independent with HEP in order to improve strength and decrease back pain in order to improve pain-free function at home and work.    Baseline 07/08/20 Educated on HEP and handout given    Time 4    Period Weeks    Status New    Target Date 08/05/20             PT Long Term Goals - 07/08/20 1625      PT LONG TERM GOAL #1   Title Pt will increase FOTO score to 57 to indicate improved funcitonal mobility with ADLs.    Baseline 07/08/20 FOTO 47    Time 8    Period Weeks    Status New    Target Date 09/02/20      PT LONG TERM GOAL #2   Title Pt will decrease worst back pain as reported on NPRS by at least 2 points in order to demonstrate clinically significant reduction in back pain.    Baseline 07/08/20 Worst 5/10    Time 8    Period Weeks    Status New      PT LONG TERM GOAL #3   Title Pt will be able to ambulate for 30 minutes with pain <3/10 for improved ambulation endurance with less pain.    Baseline 07/08/20 Amb brings on worst pain 5/10, pain onsets at 5 minutes    Time 8    Period Weeks    Status New    Target Date 09/02/20      PT LONG TERM GOAL #4   Title Pt will decrease 5xSTS to 12 seconds or less to demonstrate signficantly improved LE strength similar to age matched norms.    Baseline 07/08/20 5xSTS 16 sec    Time 8    Period Weeks    Status New    Target Date 09/02/20      PT LONG TERM GOAL #5   Title  Pt will increase gait speed to at least 1.31m/s for full community ambulation.    Baseline 07/08/20 2m/s  Time 8    Period Weeks    Status New                 Plan - 07/15/20 1319    Clinical Impression Statement PT continued therex progression for increased spine mobility and hip/core strength and stability with good success.  Pt with reports of dizziness with positional changes that subside after 5 minutes of rest; pt reports taking medications prior to therapy, will continue to monitor dizziness sx.  Pt requires reduced verbal cueing for therex with no increase in pain; multimodal cueing required for squat mechanics with decent carry over.  PT will continue progression as able.    Personal Factors and Comorbidities Age;Comorbidity 1    Comorbidities HTN    Examination-Activity Limitations Stand;Transfers;Patent attorney for Others;Squat;Stairs    Examination-Participation Restrictions Community Activity;Interpersonal Relationship;Shop;Cleaning    Stability/Clinical Decision Making Evolving/Moderate complexity    Clinical Decision Making Moderate    Rehab Potential Good    PT Frequency 2x / week    PT Duration 6 weeks    PT Treatment/Interventions ADLs/Self Care Home Management;Cryotherapy;Electrical Stimulation;Moist Heat;Traction;Gait training;Stair training;Functional mobility training;Therapeutic activities;Therapeutic exercise;Patient/family education;Cognitive remediation;Neuromuscular re-education;Balance training;Manual techniques    PT Next Visit Plan Lumbar mobility, core/hip strengthening    PT Home Exercise Plan glute bridge, hip flexor stretch, mini squat, SLS    Consulted and Agree with Plan of Care Patient           Patient will benefit from skilled therapeutic intervention in order to improve the following deficits and impairments:  Abnormal gait, Improper body mechanics, Pain, Decreased coordination, Decreased mobility, Postural dysfunction, Decreased  activity tolerance, Decreased endurance, Decreased range of motion, Decreased strength, Hypomobility, Impaired perceived functional ability, Difficulty walking, Impaired flexibility  Visit Diagnosis: Chronic midline low back pain without sciatica  Decreased ROM of lumbar spine  Abnormal posture  Abnormality of gait and mobility     Problem List Patient Active Problem List   Diagnosis Date Noted  . Dyspnea on exertion 04/19/2020  . Aortic atherosclerosis ([place]) 04/19/2020  . Essential hypertension 04/19/2020  . Hyperlipidemia 04/19/2020  . Right carpal tunnel syndrome 11/09/2016    Durwin Reges DPT Chinita Greenland, SPT Durwin Reges 07/15/2020, 5:17 PM  [facility] [address] [place], [address] Phone: [phone]   Fax:  [phone]  Name: Ramonita Koenig MRN: [id number] Date of Birth: [birth year]

## 2020-07-17 ENCOUNTER — Ambulatory Visit: Payer: PPO | Admitting: Physical Therapy

## 2020-07-19 DIAGNOSIS — H00025 Hordeolum internum left lower eyelid: Secondary | ICD-10-CM | POA: Diagnosis not present

## 2020-07-19 DIAGNOSIS — L03213 Periorbital cellulitis: Secondary | ICD-10-CM | POA: Diagnosis not present

## 2020-07-19 DIAGNOSIS — H1032 Unspecified acute conjunctivitis, left eye: Secondary | ICD-10-CM | POA: Diagnosis not present

## 2020-07-22 ENCOUNTER — Ambulatory Visit: Payer: PPO | Admitting: Physical Therapy

## 2020-07-24 ENCOUNTER — Ambulatory Visit: Payer: PPO | Admitting: Physical Therapy

## 2020-07-25 ENCOUNTER — Other Ambulatory Visit: Payer: Self-pay

## 2020-07-25 ENCOUNTER — Ambulatory Visit: Payer: PPO | Attending: Family Medicine | Admitting: Physical Therapy

## 2020-07-25 ENCOUNTER — Encounter: Payer: Self-pay | Admitting: Physical Therapy

## 2020-07-25 DIAGNOSIS — G8929 Other chronic pain: Secondary | ICD-10-CM | POA: Diagnosis not present

## 2020-07-25 DIAGNOSIS — R293 Abnormal posture: Secondary | ICD-10-CM | POA: Insufficient documentation

## 2020-07-25 DIAGNOSIS — R269 Unspecified abnormalities of gait and mobility: Secondary | ICD-10-CM | POA: Insufficient documentation

## 2020-07-25 DIAGNOSIS — M545 Low back pain: Secondary | ICD-10-CM | POA: Insufficient documentation

## 2020-07-25 DIAGNOSIS — M5386 Other specified dorsopathies, lumbar region: Secondary | ICD-10-CM | POA: Insufficient documentation

## 2020-07-25 NOTE — Therapy (Signed)
[facility] [address], [place], [address] Phone: [phone]   Fax:  [phone]  Physical Therapy Treatment  Patient Details  Name: Michelle Cisneros MRN: [id number] Date of Birth: April 09, 1956 No data recorded  Encounter Date: 07/25/2020   PT End of Session - 07/25/20 1136    Visit Number 4    Number of Visits 16    Date for PT Re-Evaluation 09/02/20    PT Start Time 3810    PT Stop Time 1130    PT Time Calculation (min) 45 min    Activity Tolerance Patient tolerated treatment well;No increased pain    Behavior During Therapy WFL for tasks assessed/performed           Past Medical History:  Diagnosis Date  . Arthritis   . Back pain   . Collagen vascular disease ([place])   . Depression   . Hyperlipemia   . Hypertension   . Melanoma ([place])   . Recurrent UTI     Past Surgical History:  Procedure Laterality Date  . ABDOMINAL HYSTERECTOMY    . COLONOSCOPY WITH PROPOFOL N/A 05/26/2020   Procedure: COLONOSCOPY WITH PROPOFOL;  Surgeon: Toledo, Benay Pike, MD;  Location: [facility] ENDOSCOPY;  Service: Gastroenterology;  Laterality: N/A;  . INCONTINENCE SURGERY    . OOPHORECTOMY      There were no vitals filed for this visit.   Subjective Assessment - 07/25/20 1134    Subjective Pt reports no pain upon arrival today.  Reports some compliance with HEP.  No problems reported with home exercises.    Pertinent History Pt is a 64 y.o. retired female referred for chronic LBP.  Pt is a year post L1 compression fracture d/t a fall from a stool onto her back (May 2020) which was the initial onset of her back pain.  Pt received PT and her condition significantly improved; since leaving PT, pt reports her LBP has been worsening and she has had little motivation to do her exercises at home.  Pt describes pain as a tiring, fatigue-like pain (Worst: 5/10, Best/Current: 0/10); reporting that it feels as if her "frame is  collapsing" when she walks.  Aggravating factors include walking, standing, carrying her grandkids (3 of her 4 grandkids are <4 y.o.), floor transfers, and stairs.  Easing factors include sitting, laying down, and pain medications (advil) as needed on days that she plans to move more.  Pt has a hx of LBP and stenosis noted on imaging taken on 06/04/2019.  Pt reports sx are localized in her low back with no radiating sx and no reports of N/T; hx of sciatica noted after a fall in her 15s but has since resolved.  Pt reports a high fear of falling and avoidance of many activities d/t condition.  Prior to injury, pt was a marathon walker and walked 5 miles a day and did yoga 2-3x a week.  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  Pt denies N/V, B&B changes, unexplained weight fluctuation, saddle paresthesia, fever, night sweats, or unrelenting night pain at this time.    Limitations Standing;Walking;House hold activities    Patient Stated Goals ability to walk 30 minutes, strengthening my core    Currently in Pain? No/denies             Treatment:  Therapeutic Exercises: Warm up on Nustep L3 (UE's  8 and LE's 7) for 5 min; f/b standing marching at bar with ab squeeze 2x10; seated B UE punch outs with 2# dumbbell in each hand while seated on large blue Swiss Ball with ab bracing and SBA x 1 for safety - 3x10; Child's pose stretch on large mat 3x30 sec; Figure 4 stretch on mat 3x30 sec B; Alternating hip extension in quadruped with ab bracing 10x B; Bird Dogs in quadruped 2x10 B; Hooklying clamshells with GTB 3x10; LTR knee flops 10x10" B; Bridging with RTB and glut sets 2x10; Lateral flexion stretches seated with hands on large blue Swiss Ball 10x B; Resisted ambulation with blue tubing 10x forward and 10x backward with SBAx1 for safety and gait belt.                        PT Education -  07/25/20 1135    Education Details Therex form/ technique    Person(s) Educated Patient    Methods Explanation;Verbal cues;Demonstration    Comprehension Returned demonstration;Verbalized understanding;Verbal cues required            PT Short Term Goals - 07/08/20 1622      PT SHORT TERM GOAL #1   Title Pt will be independent with HEP in order to improve strength and decrease back pain in order to improve pain-free function at home and work.    Baseline 07/08/20 Educated on HEP and handout given    Time 4    Period Weeks    Status New    Target Date 08/05/20             PT Long Term Goals - 07/08/20 1625      PT LONG TERM GOAL #1   Title Pt will increase FOTO score to 57 to indicate improved funcitonal mobility with ADLs.    Baseline 07/08/20 FOTO 47    Time 8    Period Weeks    Status New    Target Date 09/02/20      PT LONG TERM GOAL #2   Title Pt will decrease worst back pain as reported on NPRS by at least 2 points in order to demonstrate clinically significant reduction in back pain.    Baseline 07/08/20 Worst 5/10    Time 8    Period Weeks    Status New      PT LONG TERM GOAL #3   Title Pt will be able to ambulate for 30 minutes with pain <3/10 for improved ambulation endurance with less pain.    Baseline 07/08/20 Amb brings on worst pain 5/10, pain onsets at 5 minutes    Time 8    Period Weeks    Status New    Target Date 09/02/20      PT LONG TERM GOAL #4   Title Pt will decrease 5xSTS to 12 seconds or less to demonstrate signficantly improved LE strength similar to age matched norms.    Baseline 07/08/20 5xSTS 16 sec    Time 8    Period Weeks    Status New    Target Date 09/02/20      PT LONG TERM GOAL #5   Title Pt will increase gait speed to at least 1.58m/s for full community ambulation.    Baseline 07/08/20 41m/s    Time 8    Period Weeks    Status New                 Plan -  07/25/20 1136    Clinical Impression Statement Added resisted  ambulation for trunk strengthening without problems today.  Pt needed a good bit of verbal cueing to hold ther ex's for appropriate length of time for best benefit, with both strenghtening and flexibility ther ex's today.  Continue to work on core strengthening and hip strengthening at next visit.    Personal Factors and Comorbidities Age;Comorbidity 1    Comorbidities HTN    Examination-Activity Limitations Stand;Transfers;Patent attorney for Others;Squat;Stairs    Examination-Participation Restrictions Community Activity;Interpersonal Relationship;Shop;Cleaning    Stability/Clinical Decision Making Evolving/Moderate complexity    Clinical Decision Making Moderate    Rehab Potential Poor    PT Frequency 2x / week    PT Duration 6 weeks    PT Treatment/Interventions ADLs/Self Care Home Management;Cryotherapy;Electrical Stimulation;Moist Heat;Traction;Gait training;Stair training;Functional mobility training;Therapeutic activities;Therapeutic exercise;Patient/family education;Cognitive remediation;Neuromuscular re-education;Balance training;Manual techniques    PT Next Visit Plan continue to work on lumbar mobility, and hip/core strengthening    PT Home Exercise Plan glute bridge, hip flexor stretch, mini squat, SLS    Consulted and Agree with Plan of Care Patient           Patient will benefit from skilled therapeutic intervention in order to improve the following deficits and impairments:  Abnormal gait, Improper body mechanics, Pain, Decreased coordination, Decreased mobility, Postural dysfunction, Decreased activity tolerance, Decreased endurance, Decreased range of motion, Decreased strength, Hypomobility, Impaired perceived functional ability, Difficulty walking, Impaired flexibility  Visit Diagnosis: Chronic midline low back pain without sciatica  Decreased ROM of lumbar spine  Abnormal posture     Problem List Patient Active Problem List   Diagnosis Date Noted  .  Dyspnea on exertion 04/19/2020  . Aortic atherosclerosis ([place]) 04/19/2020  . Essential hypertension 04/19/2020  . Hyperlipidemia 04/19/2020  . Right carpal tunnel syndrome 11/09/2016    Chirsty Armistead, MPT 07/25/2020, 11:39 AM  [facility] [address] [place], [address] Phone: [phone]   Fax:  [phone]  Name: Michelle Cisneros MRN: [id number] Date of Birth: 1956/06/25

## 2020-07-29 ENCOUNTER — Other Ambulatory Visit: Payer: Self-pay

## 2020-07-29 ENCOUNTER — Encounter: Payer: Self-pay | Admitting: Physical Therapy

## 2020-07-29 ENCOUNTER — Ambulatory Visit: Payer: PPO | Admitting: Physical Therapy

## 2020-07-29 DIAGNOSIS — M5386 Other specified dorsopathies, lumbar region: Secondary | ICD-10-CM

## 2020-07-29 DIAGNOSIS — M545 Low back pain, unspecified: Secondary | ICD-10-CM

## 2020-07-29 DIAGNOSIS — N390 Urinary tract infection, site not specified: Secondary | ICD-10-CM

## 2020-07-29 DIAGNOSIS — R293 Abnormal posture: Secondary | ICD-10-CM

## 2020-07-29 DIAGNOSIS — N3946 Mixed incontinence: Secondary | ICD-10-CM

## 2020-07-29 DIAGNOSIS — R269 Unspecified abnormalities of gait and mobility: Secondary | ICD-10-CM

## 2020-07-29 MED ORDER — TRIMETHOPRIM 100 MG PO TABS: 100.0000 mg | ORAL_TABLET | Freq: Every day | ORAL | 3 refills | Status: DC

## 2020-07-29 NOTE — Therapy (Signed)
[facility] [address], [place], [address] Phone: [phone]   Fax:  [phone]  Physical Therapy Treatment  Patient Details  Name: Michelle Cisneros MRN: [id number] Date of Birth: 1956/09/28 No data recorded  Encounter Date: 07/29/2020   PT End of Session - 07/29/20 1654    Visit Number 5    Number of Visits 16    Date for PT Re-Evaluation 09/02/20    PT Start Time 0445    PT Stop Time 0525    PT Time Calculation (min) 40 min    Activity Tolerance Patient tolerated treatment well;No increased pain    Behavior During Therapy WFL for tasks assessed/performed           Past Medical History:  Diagnosis Date  . Arthritis   . Back pain   . Collagen vascular disease ([place])   . Depression   . Hyperlipemia   . Hypertension   . Melanoma ([place])   . Recurrent UTI     Past Surgical History:  Procedure Laterality Date  . ABDOMINAL HYSTERECTOMY    . COLONOSCOPY WITH PROPOFOL N/A 05/26/2020   Procedure: COLONOSCOPY WITH PROPOFOL;  Surgeon: Toledo, Benay Pike, MD;  Location: [facility] ENDOSCOPY;  Service: Gastroenterology;  Laterality: N/A;  . INCONTINENCE SURGERY    . OOPHORECTOMY      There were no vitals filed for this visit.   Subjective Assessment - 07/29/20 1648    Subjective Patient reports no pain today. She reports she did not complete HEP over the weekend d/t eye infection, and notices she is more "hunched over", and that she really "does better" when she is exercising regularly.    Pertinent History Pt is a 64 y.o. retired female referred for chronic LBP.  Pt is a year post L1 compression fracture d/t a fall from a stool onto her back (May 2020) which was the initial onset of her back pain.  Pt received PT and her condition significantly improved; since leaving PT, pt reports her LBP has been worsening and she has had little motivation to do her exercises at home.  Pt describes pain as a tiring,  fatigue-like pain (Worst: 5/10, Best/Current: 0/10); reporting that it feels as if her "frame is collapsing" when she walks.  Aggravating factors include walking, standing, carrying her grandkids (3 of her 4 grandkids are <4 y.o.), floor transfers, and stairs.  Easing factors include sitting, laying down, and pain medications (advil) as needed on days that she plans to move more.  Pt has a hx of LBP and stenosis noted on imaging taken on 06/04/2019.  Pt reports sx are localized in her low back with no radiating sx and no reports of N/T; hx of sciatica noted after a fall in her 47s but has since resolved.  Pt reports a high fear of falling and avoidance of many activities d/t condition.  Prior to injury, pt was a marathon walker and walked 5 miles a day and did yoga 2-3x a week.  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  Pt denies N/V, B&B changes, unexplained weight fluctuation, saddle paresthesia, fever, night sweats, or unrelenting night pain at this time.    How long can you sit comfortably? Unlimited    How long can you stand comfortably? 5-10 minutes    How long can you walk comfortably? 5 minutes  Patient Stated Goals ability to walk 30 minutes, strengthening my core    Pain Onset More than a month ago           Ther-Ex - Nustep L3 UE setting 8; LE 7 67mins for gentle rotation and low grade strengthening/endurance  - Bridge with RTB for abd resistance 2x 10 with good carry over of technique - Prone alt hip 3x 10 with min cuing to prevent rotation compensation with good carry over, decreased available ext - Quadruped alt bird dog 2x10 with min cuing cuing  - Mini squat to elevated mat table x10 with cuing for full hip ext with some carry over; with 5# DB swing 2x 10 with better carry over of glute contraction with this - MATRIX hip ext 40# 3x 10 bilat with cuing for posture needed with good carry  over                         PT Education - 07/29/20 1651    Education Details therex form/technique    Person(s) Educated Patient    Methods Explanation;Demonstration;Verbal cues    Comprehension Verbalized understanding;Returned demonstration;Verbal cues required            PT Short Term Goals - 07/08/20 1622      PT SHORT TERM GOAL #1   Title Pt will be independent with HEP in order to improve strength and decrease back pain in order to improve pain-free function at home and work.    Baseline 07/08/20 Educated on HEP and handout given    Time 4    Period Weeks    Status New    Target Date 08/05/20             PT Long Term Goals - 07/08/20 1625      PT LONG TERM GOAL #1   Title Pt will increase FOTO score to 57 to indicate improved funcitonal mobility with ADLs.    Baseline 07/08/20 FOTO 47    Time 8    Period Weeks    Status New    Target Date 09/02/20      PT LONG TERM GOAL #2   Title Pt will decrease worst back pain as reported on NPRS by at least 2 points in order to demonstrate clinically significant reduction in back pain.    Baseline 07/08/20 Worst 5/10    Time 8    Period Weeks    Status New      PT LONG TERM GOAL #3   Title Pt will be able to ambulate for 30 minutes with pain <3/10 for improved ambulation endurance with less pain.    Baseline 07/08/20 Amb brings on worst pain 5/10, pain onsets at 5 minutes    Time 8    Period Weeks    Status New    Target Date 09/02/20      PT LONG TERM GOAL #4   Title Pt will decrease 5xSTS to 12 seconds or less to demonstrate signficantly improved LE strength similar to age matched norms.    Baseline 07/08/20 5xSTS 16 sec    Time 8    Period Weeks    Status New    Target Date 09/02/20      PT LONG TERM GOAL #5   Title Pt will increase gait speed to at least 1.58m/s for full community ambulation.    Baseline 07/08/20 22m/s    Time 8    Period Weeks  Status New                 Plan  - 07/29/20 1702    Clinical Impression Statement PT continued therex progression for increased hip/core strengthening and increased ext for neutral posture with good success. Patient is able to complete all therex progressions with proper technique following cuing with good motivated throughout sessions. PT willc ontinue progression as able.    Personal Factors and Comorbidities Age;Comorbidity 1    Comorbidities HTN    Examination-Activity Limitations Stand;Transfers;Patent attorney for Others;Squat;Stairs    Examination-Participation Restrictions Community Activity;Interpersonal Relationship;Shop;Cleaning    Stability/Clinical Decision Making Evolving/Moderate complexity    Clinical Decision Making Moderate    Rehab Potential Poor    PT Frequency 2x / week    PT Duration 6 weeks    PT Treatment/Interventions ADLs/Self Care Home Management;Cryotherapy;Electrical Stimulation;Moist Heat;Traction;Gait training;Stair training;Functional mobility training;Therapeutic activities;Therapeutic exercise;Patient/family education;Cognitive remediation;Neuromuscular re-education;Balance training;Manual techniques    PT Next Visit Plan continue to work on lumbar mobility, and hip/core strengthening    PT Home Exercise Plan glute bridge, hip flexor stretch, mini squat, SLS    Consulted and Agree with Plan of Care Patient           Patient will benefit from skilled therapeutic intervention in order to improve the following deficits and impairments:  Abnormal gait, Improper body mechanics, Pain, Decreased coordination, Decreased mobility, Postural dysfunction, Decreased activity tolerance, Decreased endurance, Decreased range of motion, Decreased strength, Hypomobility, Impaired perceived functional ability, Difficulty walking, Impaired flexibility  Visit Diagnosis: Chronic midline low back pain without sciatica  Decreased ROM of lumbar spine  Abnormal posture  Abnormality of gait and  mobility     Problem List Patient Active Problem List   Diagnosis Date Noted  . Dyspnea on exertion 04/19/2020  . Aortic atherosclerosis ([place]) 04/19/2020  . Essential hypertension 04/19/2020  . Hyperlipidemia 04/19/2020  . Right carpal tunnel syndrome 11/09/2016   Durwin Reges DPT Durwin Reges 07/29/2020, 5:24 PM  [facility] [address], [place], [address] Phone: [phone]   Fax:  [phone]  Name: Michelle Cisneros MRN: [id number] Date of Birth: 06/01/1956

## 2020-07-31 ENCOUNTER — Other Ambulatory Visit: Payer: Self-pay

## 2020-07-31 ENCOUNTER — Encounter: Payer: Self-pay | Admitting: Physical Therapy

## 2020-07-31 ENCOUNTER — Ambulatory Visit: Payer: PPO | Admitting: Physical Therapy

## 2020-07-31 DIAGNOSIS — M545 Low back pain: Secondary | ICD-10-CM | POA: Diagnosis not present

## 2020-07-31 DIAGNOSIS — R269 Unspecified abnormalities of gait and mobility: Secondary | ICD-10-CM

## 2020-07-31 DIAGNOSIS — G8929 Other chronic pain: Secondary | ICD-10-CM

## 2020-07-31 DIAGNOSIS — M5386 Other specified dorsopathies, lumbar region: Secondary | ICD-10-CM

## 2020-07-31 DIAGNOSIS — R293 Abnormal posture: Secondary | ICD-10-CM

## 2020-07-31 NOTE — Therapy (Signed)
[facility] [address], [place], [address] Phone: [phone]   Fax:  [phone]  Physical Therapy Treatment  Patient Details  Name: Michelle Cisneros MRN: [id number] Date of Birth: [birth year] No data recorded  Encounter Date: 07/31/2020   PT End of Session - 07/31/20 1121    Visit Number 6    Number of Visits 16    Date for PT Re-Evaluation 09/02/20    PT Start Time 1117    PT Stop Time 1155    PT Time Calculation (min) 38 min    Activity Tolerance Patient tolerated treatment well;No increased pain    Behavior During Therapy WFL for tasks assessed/performed           Past Medical History:  Diagnosis Date  . Arthritis   . Back pain   . Collagen vascular disease ([place])   . Depression   . Hyperlipemia   . Hypertension   . Melanoma ([place])   . Recurrent UTI     Past Surgical History:  Procedure Laterality Date  . ABDOMINAL HYSTERECTOMY    . COLONOSCOPY WITH PROPOFOL N/A 05/26/2020   Procedure: COLONOSCOPY WITH PROPOFOL;  Surgeon: Toledo, Benay Pike, MD;  Location: [facility] ENDOSCOPY;  Service: Gastroenterology;  Laterality: N/A;  . INCONTINENCE SURGERY    . OOPHORECTOMY      There were no vitals filed for this visit.   Subjective Assessment - 07/31/20 1119    Subjective Patient reports no pain today, and that she is completing HEP regularly, but has difficulty with STS    Pertinent History Pt is a 64 y.o. retired female referred for chronic LBP.  Pt is a year post L1 compression fracture d/t a fall from a stool onto her back (May 2020) which was the initial onset of her back pain.  Pt received PT and her condition significantly improved; since leaving PT, pt reports her LBP has been worsening and she has had little motivation to do her exercises at home.  Pt describes pain as a tiring, fatigue-like pain (Worst: 5/10, Best/Current: 0/10); reporting that it feels as if her "frame is collapsing" when she  walks.  Aggravating factors include walking, standing, carrying her grandkids (3 of her 4 grandkids are <4 y.o.), floor transfers, and stairs.  Easing factors include sitting, laying down, and pain medications (advil) as needed on days that she plans to move more.  Pt has a hx of LBP and stenosis noted on imaging taken on 06/04/2019.  Pt reports sx are localized in her low back with no radiating sx and no reports of N/T; hx of sciatica noted after a fall in her 5s but has since resolved.  Pt reports a high fear of falling and avoidance of many activities d/t condition.  Prior to injury, pt was a marathon walker and walked 5 miles a day and did yoga 2-3x a week.  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  Pt denies N/V, B&B changes, unexplained weight fluctuation, saddle paresthesia, fever, night sweats, or unrelenting night pain at this time.    Limitations Standing;Walking;House hold activities    How long can you sit comfortably? Unlimited    How long can you stand comfortably? 5-10 minutes    How long can you walk comfortably? 5 minutes    Patient Stated Goals ability to walk 30 minutes, strengthening my core  Pain Onset More than a month ago              Ther-Ex - Nustep L3 UE setting 8; LE 7 77mins for gentle rotation and low grade strengthening/endurance  - Mini squat to elevated mat table 3x 10 with max cuing and mirror needed for proper technique  - Sidestepping with RTB R18ft L44ft x3 with cuing to maintain mini squat with decent carry over - SL bridge 3x 10 with cuing for full hip ext with decent carry over  - Thomas stretch PT overpressure x60sec - Hip hike x10 with good carry over of demo; with CL LE swing 2x 10 bilat with cuing for ext of swing with good carry over - Prone lumbar ext with head/lumbar lift 2x 10                         PT Education - 07/31/20 1121     Education Details therex form/technique    Person(s) Educated Patient    Methods Explanation;Demonstration;Verbal cues    Comprehension Verbalized understanding;Returned demonstration;Verbal cues required            PT Short Term Goals - 07/08/20 1622      PT SHORT TERM GOAL #1   Title Pt will be independent with HEP in order to improve strength and decrease back pain in order to improve pain-free function at home and work.    Baseline 07/08/20 Educated on HEP and handout given    Time 4    Period Weeks    Status New    Target Date 08/05/20             PT Long Term Goals - 07/08/20 1625      PT LONG TERM GOAL #1   Title Pt will increase FOTO score to 57 to indicate improved funcitonal mobility with ADLs.    Baseline 07/08/20 FOTO 47    Time 8    Period Weeks    Status New    Target Date 09/02/20      PT LONG TERM GOAL #2   Title Pt will decrease worst back pain as reported on NPRS by at least 2 points in order to demonstrate clinically significant reduction in back pain.    Baseline 07/08/20 Worst 5/10    Time 8    Period Weeks    Status New      PT LONG TERM GOAL #3   Title Pt will be able to ambulate for 30 minutes with pain <3/10 for improved ambulation endurance with less pain.    Baseline 07/08/20 Amb brings on worst pain 5/10, pain onsets at 5 minutes    Time 8    Period Weeks    Status New    Target Date 09/02/20      PT LONG TERM GOAL #4   Title Pt will decrease 5xSTS to 12 seconds or less to demonstrate signficantly improved LE strength similar to age matched norms.    Baseline 07/08/20 5xSTS 16 sec    Time 8    Period Weeks    Status New    Target Date 09/02/20      PT LONG TERM GOAL #5   Title Pt will increase gait speed to at least 1.63m/s for full community ambulation.    Baseline 07/08/20 33m/s    Time 8    Period Weeks    Status New  Plan - 07/31/20 1144    Clinical Impression Statement PT continued there progression  for increased hip/core strengthening and posture with good success. Pt is able to comply with all cuing for proper technique of therex following multi-modal cuing, and is motivated throughout session. Patient with no increased pain throughout session. PT will continue progression as able.    Personal Factors and Comorbidities Age;Comorbidity 1    Comorbidities HTN    Examination-Activity Limitations Stand;Transfers;Patent attorney for Others;Squat;Stairs    Examination-Participation Restrictions Community Activity;Interpersonal Relationship;Shop;Cleaning    Stability/Clinical Decision Making Evolving/Moderate complexity    Clinical Decision Making Moderate    Rehab Potential Poor    PT Frequency 2x / week    PT Duration 6 weeks    PT Treatment/Interventions ADLs/Self Care Home Management;Cryotherapy;Electrical Stimulation;Moist Heat;Traction;Gait training;Stair training;Functional mobility training;Therapeutic activities;Therapeutic exercise;Patient/family education;Cognitive remediation;Neuromuscular re-education;Balance training;Manual techniques    PT Next Visit Plan continue to work on lumbar mobility, and hip/core strengthening    PT Home Exercise Plan glute bridge, hip flexor stretch, mini squat, SLS    Consulted and Agree with Plan of Care Patient           Patient will benefit from skilled therapeutic intervention in order to improve the following deficits and impairments:  Abnormal gait, Improper body mechanics, Pain, Decreased coordination, Decreased mobility, Postural dysfunction, Decreased activity tolerance, Decreased endurance, Decreased range of motion, Decreased strength, Hypomobility, Impaired perceived functional ability, Difficulty walking, Impaired flexibility  Visit Diagnosis: Chronic midline low back pain without sciatica  Decreased ROM of lumbar spine  Abnormal posture  Abnormality of gait and mobility     Problem List Patient Active Problem List    Diagnosis Date Noted  . Dyspnea on exertion 04/19/2020  . Aortic atherosclerosis ([place]) 04/19/2020  . Essential hypertension 04/19/2020  . Hyperlipidemia 04/19/2020  . Right carpal tunnel syndrome 11/09/2016   Durwin Reges DPT Durwin Reges 07/31/2020, 12:03 PM  [facility] [address] [place], [address] Phone: [phone]   Fax:  [phone]  Name: Jasmyn Picha MRN: [id number] Date of Birth: 01-Feb-1956

## 2020-08-04 ENCOUNTER — Ambulatory Visit: Payer: PPO | Admitting: Physical Therapy

## 2020-08-05 ENCOUNTER — Ambulatory Visit: Payer: PPO | Admitting: Physical Therapy

## 2020-08-05 ENCOUNTER — Other Ambulatory Visit: Payer: Self-pay

## 2020-08-05 ENCOUNTER — Encounter: Payer: Self-pay | Admitting: Physical Therapy

## 2020-08-05 DIAGNOSIS — M545 Low back pain, unspecified: Secondary | ICD-10-CM

## 2020-08-05 DIAGNOSIS — R269 Unspecified abnormalities of gait and mobility: Secondary | ICD-10-CM

## 2020-08-05 DIAGNOSIS — M5386 Other specified dorsopathies, lumbar region: Secondary | ICD-10-CM

## 2020-08-05 DIAGNOSIS — R293 Abnormal posture: Secondary | ICD-10-CM

## 2020-08-05 DIAGNOSIS — G8929 Other chronic pain: Secondary | ICD-10-CM

## 2020-08-05 NOTE — Therapy (Signed)
[facility] [address] [place], [address] Phone: [phone]   Fax:  [phone]  Physical Therapy Treatment  Patient Details  Name: Michelle Cisneros MRN: [id number] Date of Birth: 1956-07-27 No data recorded  Encounter Date: 08/05/2020   PT End of Session - 08/05/20 1650    Visit Number 7    Number of Visits 16    Date for PT Re-Evaluation 09/02/20    PT Start Time 0445    PT Stop Time 0525    PT Time Calculation (min) 40 min    Activity Tolerance Patient tolerated treatment well;No increased pain    Behavior During Therapy WFL for tasks assessed/performed           Past Medical History:  Diagnosis Date  . Arthritis   . Back pain   . Collagen vascular disease ([place])   . Depression   . Hyperlipemia   . Hypertension   . Melanoma ([place])   . Recurrent UTI     Past Surgical History:  Procedure Laterality Date  . ABDOMINAL HYSTERECTOMY    . COLONOSCOPY WITH PROPOFOL N/A 05/26/2020   Procedure: COLONOSCOPY WITH PROPOFOL;  Surgeon: Toledo, Benay Pike, MD;  Location: [facility] ENDOSCOPY;  Service: Gastroenterology;  Laterality: N/A;  . INCONTINENCE SURGERY    . OOPHORECTOMY      There were no vitals filed for this visit.   Subjective Assessment - 08/05/20 1645    Subjective Patient returns from trip with family in a cabin. Patient reports she did not do her HEP and she can tell in her posture. Patient reports continued no pain today, but that she did have some pain walking through the sand.    Pertinent History Pt is a 64 y.o. retired female referred for chronic LBP.  Pt is a year post L1 compression fracture d/t a fall from a stool onto her back (May 2020) which was the initial onset of her back pain.  Pt received PT and her condition significantly improved; since leaving PT, pt reports her LBP has been worsening and she has had little motivation to do her exercises at home.  Pt describes pain as a tiring,  fatigue-like pain (Worst: 5/10, Best/Current: 0/10); reporting that it feels as if her "frame is collapsing" when she walks.  Aggravating factors include walking, standing, carrying her grandkids (3 of her 4 grandkids are <4 y.o.), floor transfers, and stairs.  Easing factors include sitting, laying down, and pain medications (advil) as needed on days that she plans to move more.  Pt has a hx of LBP and stenosis noted on imaging taken on 06/04/2019.  Pt reports sx are localized in her low back with no radiating sx and no reports of N/T; hx of sciatica noted after a fall in her 50s but has since resolved.  Pt reports a high fear of falling and avoidance of many activities d/t condition.  Prior to injury, pt was a marathon walker and walked 5 miles a day and did yoga 2-3x a week.  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  Pt denies N/V, B&B changes, unexplained weight fluctuation, saddle paresthesia, fever, night sweats, or unrelenting night pain at this time.    How long can you sit comfortably? Unlimited    How long can you stand comfortably? 5-10 minutes    How long can you walk  comfortably? 5 minutes    Patient Stated Goals ability to walk 30 minutes, strengthening my core    Pain Onset More than a month ago             Ther-Ex - Nustep L3 UE setting 8; LE 7 39mins for gentle rotation and low grade strengthening/endurance  - Mini squatto elevated mat table x12 with max cuing initially for proper technique with good carry over following; 2x 10 with 5# DB with demo and cuing needed to "feel" in glutes > knees with good carry over - Prone alt supermans x10; 2x 10 with 2# AW with min cuing for full hip ext with good carry over - Prone lumbar ext with head/lumbar lift 3x 10  - Palloff antirotation 5# 3x 10 bilat with good carry over following demo and VC for eccentric control - Thomas stretch PT  overpressure x60sec - Seated lumbar ext with towel roll x20 2-3sec hold                          PT Education - 08/05/20 1649    Education Details therex form/technique    Person(s) Educated Patient    Methods Explanation;Demonstration;Verbal cues    Comprehension Verbalized understanding;Verbal cues required;Returned demonstration            PT Short Term Goals - 07/08/20 1622      PT SHORT TERM GOAL #1   Title Pt will be independent with HEP in order to improve strength and decrease back pain in order to improve pain-free function at home and work.    Baseline 07/08/20 Educated on HEP and handout given    Time 4    Period Weeks    Status New    Target Date 08/05/20             PT Long Term Goals - 07/08/20 1625      PT LONG TERM GOAL #1   Title Pt will increase FOTO score to 57 to indicate improved funcitonal mobility with ADLs.    Baseline 07/08/20 FOTO 47    Time 8    Period Weeks    Status New    Target Date 09/02/20      PT LONG TERM GOAL #2   Title Pt will decrease worst back pain as reported on NPRS by at least 2 points in order to demonstrate clinically significant reduction in back pain.    Baseline 07/08/20 Worst 5/10    Time 8    Period Weeks    Status New      PT LONG TERM GOAL #3   Title Pt will be able to ambulate for 30 minutes with pain <3/10 for improved ambulation endurance with less pain.    Baseline 07/08/20 Amb brings on worst pain 5/10, pain onsets at 5 minutes    Time 8    Period Weeks    Status New    Target Date 09/02/20      PT LONG TERM GOAL #4   Title Pt will decrease 5xSTS to 12 seconds or less to demonstrate signficantly improved LE strength similar to age matched norms.    Baseline 07/08/20 5xSTS 16 sec    Time 8    Period Weeks    Status New    Target Date 09/02/20      PT LONG TERM GOAL #5   Title Pt will increase gait speed to at least 1.29m/s for full community ambulation.  Baseline 07/08/20 28m/s     Time 8    Period Weeks    Status New                 Plan - 08/05/20 1706    Clinical Impression Statement PT continued therex progression for increased hip/core strengthening and postural restoration with success. Patient is able to comply with all cuing for proper technique of therex with good carry over, and no increased pain. PT will continue progression as able.    Personal Factors and Comorbidities Age;Comorbidity 1    Comorbidities HTN    Examination-Activity Limitations Stand;Transfers;Patent attorney for Others;Squat;Stairs    Examination-Participation Restrictions Community Activity;Interpersonal Relationship;Shop;Cleaning    Stability/Clinical Decision Making Evolving/Moderate complexity    Clinical Decision Making Moderate    Rehab Potential Poor    PT Frequency 2x / week    PT Duration 6 weeks    PT Treatment/Interventions ADLs/Self Care Home Management;Cryotherapy;Electrical Stimulation;Moist Heat;Traction;Gait training;Stair training;Functional mobility training;Therapeutic activities;Therapeutic exercise;Patient/family education;Cognitive remediation;Neuromuscular re-education;Balance training;Manual techniques    PT Next Visit Plan continue to work on lumbar mobility, and hip/core strengthening    PT Home Exercise Plan glute bridge, hip flexor stretch, mini squat, SLS    Consulted and Agree with Plan of Care Patient           Patient will benefit from skilled therapeutic intervention in order to improve the following deficits and impairments:  Abnormal gait, Improper body mechanics, Pain, Decreased coordination, Decreased mobility, Postural dysfunction, Decreased activity tolerance, Decreased endurance, Decreased range of motion, Decreased strength, Hypomobility, Impaired perceived functional ability, Difficulty walking, Impaired flexibility  Visit Diagnosis: Chronic midline low back pain without sciatica  Decreased ROM of lumbar spine  Abnormal  posture  Abnormality of gait and mobility     Problem List Patient Active Problem List   Diagnosis Date Noted  . Dyspnea on exertion 04/19/2020  . Aortic atherosclerosis ([place]) 04/19/2020  . Essential hypertension 04/19/2020  . Hyperlipidemia 04/19/2020  . Right carpal tunnel syndrome 11/09/2016   Durwin Reges DPT Durwin Reges 08/05/2020, 5:24 PM  [facility] [address], [place], [address] Phone: [phone]   Fax:  [phone]  Name: Michelle Cisneros MRN: [id number] Date of Birth: 07-Sep-1956

## 2020-08-07 ENCOUNTER — Encounter: Payer: Self-pay | Admitting: Physical Therapy

## 2020-08-07 ENCOUNTER — Ambulatory Visit: Payer: PPO | Admitting: Physical Therapy

## 2020-08-07 ENCOUNTER — Other Ambulatory Visit: Payer: Self-pay

## 2020-08-07 DIAGNOSIS — R269 Unspecified abnormalities of gait and mobility: Secondary | ICD-10-CM

## 2020-08-07 DIAGNOSIS — M545 Low back pain: Secondary | ICD-10-CM | POA: Diagnosis not present

## 2020-08-07 DIAGNOSIS — R293 Abnormal posture: Secondary | ICD-10-CM

## 2020-08-07 DIAGNOSIS — M5386 Other specified dorsopathies, lumbar region: Secondary | ICD-10-CM

## 2020-08-07 DIAGNOSIS — G8929 Other chronic pain: Secondary | ICD-10-CM

## 2020-08-07 NOTE — Therapy (Signed)
[facility] [address] [place], [address] Phone: [phone]   Fax:  [phone]  Physical Therapy Treatment  Patient Details  Name: Michelle Cisneros MRN: [id number] Date of Birth: 1956-12-10 No data recorded  Encounter Date: 08/07/2020   PT End of Session - 08/07/20 1120    Visit Number 8    Number of Visits 16    Date for PT Re-Evaluation 09/02/20    PT Start Time 1115    PT Stop Time 1155    PT Time Calculation (min) 40 min    Activity Tolerance Patient tolerated treatment well;No increased pain    Behavior During Therapy WFL for tasks assessed/performed           Past Medical History:  Diagnosis Date  . Arthritis   . Back pain   . Collagen vascular disease ([place])   . Depression   . Hyperlipemia   . Hypertension   . Melanoma ([place])   . Recurrent UTI     Past Surgical History:  Procedure Laterality Date  . ABDOMINAL HYSTERECTOMY    . COLONOSCOPY WITH PROPOFOL N/A 05/26/2020   Procedure: COLONOSCOPY WITH PROPOFOL;  Surgeon: Toledo, Benay Pike, MD;  Location: [facility] ENDOSCOPY;  Service: Gastroenterology;  Laterality: N/A;  . INCONTINENCE SURGERY    . OOPHORECTOMY      There were no vitals filed for this visit.   Subjective Assessment - 08/07/20 1118    Subjective Patient reports no pain today, mostly just stiffness in the low back.    Pertinent History Pt is a 64 y.o. retired female referred for chronic LBP.  Pt is a year post L1 compression fracture d/t a fall from a stool onto her back (May 2020) which was the initial onset of her back pain.  Pt received PT and her condition significantly improved; since leaving PT, pt reports her LBP has been worsening and she has had little motivation to do her exercises at home.  Pt describes pain as a tiring, fatigue-like pain (Worst: 5/10, Best/Current: 0/10); reporting that it feels as if her "frame is collapsing" when she walks.  Aggravating factors  include walking, standing, carrying her grandkids (3 of her 4 grandkids are <4 y.o.), floor transfers, and stairs.  Easing factors include sitting, laying down, and pain medications (advil) as needed on days that she plans to move more.  Pt has a hx of LBP and stenosis noted on imaging taken on 06/04/2019.  Pt reports sx are localized in her low back with no radiating sx and no reports of N/T; hx of sciatica noted after a fall in her 52s but has since resolved.  Pt reports a high fear of falling and avoidance of many activities d/t condition.  Prior to injury, pt was a marathon walker and walked 5 miles a day and did yoga 2-3x a week.  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  Pt denies N/V, B&B changes, unexplained weight fluctuation, saddle paresthesia, fever, night sweats, or unrelenting night pain at this time.    Limitations Standing;Walking;House hold activities    How long can you sit comfortably? Unlimited    How long can you stand comfortably? 5-10 minutes    How long can you walk comfortably? 5 minutes    Patient Stated Goals ability to walk 30 minutes, strengthening my core    Pain Onset  More than a month ago           Ther-Ex - Nustep L3 UE setting 8; LE 7 37mins for gentle rotation and low grade strengthening/endurance  - Lower lumbar rotations x20 2sec hold  - Prone press up 2x 12 with prone prop 68min between and following sets - Prone alt supermans 3x 10 with 2# AW with min cuing for full hip ext with good carry over - Mini squatwith 10# DB swing 3x 10 with light "sit" needed for proper technique of therex with decent carry over - OMEGA hip ext 40# 3x 10 bilat with much better carry over of posture than previous attempt, good ability to contract glute without excessive spine ext - Seated lumbar ext with towel roll x20 2-3sec hold                   PT Education -  08/07/20 1119    Education Details therex form/technique    Person(s) Educated Patient    Methods Explanation;Demonstration;Verbal cues    Comprehension Verbalized understanding;Returned demonstration;Verbal cues required            PT Short Term Goals - 07/08/20 1622      PT SHORT TERM GOAL #1   Title Pt will be independent with HEP in order to improve strength and decrease back pain in order to improve pain-free function at home and work.    Baseline 07/08/20 Educated on HEP and handout given    Time 4    Period Weeks    Status New    Target Date 08/05/20             PT Long Term Goals - 07/08/20 1625      PT LONG TERM GOAL #1   Title Pt will increase FOTO score to 57 to indicate improved funcitonal mobility with ADLs.    Baseline 07/08/20 FOTO 47    Time 8    Period Weeks    Status New    Target Date 09/02/20      PT LONG TERM GOAL #2   Title Pt will decrease worst back pain as reported on NPRS by at least 2 points in order to demonstrate clinically significant reduction in back pain.    Baseline 07/08/20 Worst 5/10    Time 8    Period Weeks    Status New      PT LONG TERM GOAL #3   Title Pt will be able to ambulate for 30 minutes with pain <3/10 for improved ambulation endurance with less pain.    Baseline 07/08/20 Amb brings on worst pain 5/10, pain onsets at 5 minutes    Time 8    Period Weeks    Status New    Target Date 09/02/20      PT LONG TERM GOAL #4   Title Pt will decrease 5xSTS to 12 seconds or less to demonstrate signficantly improved LE strength similar to age matched norms.    Baseline 07/08/20 5xSTS 16 sec    Time 8    Period Weeks    Status New    Target Date 09/02/20      PT LONG TERM GOAL #5   Title Pt will increase gait speed to at least 1.12m/s for full community ambulation.    Baseline 07/08/20 89m/s    Time 8    Period Weeks    Status New  Plan - 08/07/20 1140    Clinical Impression Statement PT continued  therex progression for increased extension mobility and hip ext/core strength. Patient is increasing ability to identify glute contraction and isolate this withough excessive spine compensation. Patient is able to comply with all cuing for proper technique of therex with good motivation throughout session, no increased pain. PT will continue progression as able.    Personal Factors and Comorbidities Age;Comorbidity 1    Comorbidities HTN    Examination-Activity Limitations Stand;Transfers;Patent attorney for Others;Squat;Stairs    Examination-Participation Restrictions Community Activity;Interpersonal Relationship;Shop;Cleaning    Stability/Clinical Decision Making Evolving/Moderate complexity    Clinical Decision Making Moderate    Rehab Potential Poor    PT Frequency 2x / week    PT Duration 6 weeks    PT Treatment/Interventions ADLs/Self Care Home Management;Cryotherapy;Electrical Stimulation;Moist Heat;Traction;Gait training;Stair training;Functional mobility training;Therapeutic activities;Therapeutic exercise;Patient/family education;Cognitive remediation;Neuromuscular re-education;Balance training;Manual techniques    PT Next Visit Plan continue to work on lumbar mobility, and hip/core strengthening    PT Home Exercise Plan glute bridge, hip flexor stretch, mini squat, SLS    Consulted and Agree with Plan of Care Patient           Patient will benefit from skilled therapeutic intervention in order to improve the following deficits and impairments:  Abnormal gait, Improper body mechanics, Pain, Decreased coordination, Decreased mobility, Postural dysfunction, Decreased activity tolerance, Decreased endurance, Decreased range of motion, Decreased strength, Hypomobility, Impaired perceived functional ability, Difficulty walking, Impaired flexibility  Visit Diagnosis: Chronic midline low back pain without sciatica  Decreased ROM of lumbar spine  Abnormal posture  Abnormality of  gait and mobility     Problem List Patient Active Problem List   Diagnosis Date Noted  . Dyspnea on exertion 04/19/2020  . Aortic atherosclerosis ([place]) 04/19/2020  . Essential hypertension 04/19/2020  . Hyperlipidemia 04/19/2020  . Right carpal tunnel syndrome 11/09/2016   Durwin Reges DPT Durwin Reges 08/07/2020, 11:54 AM  [facility] [address], [place], [address] Phone: [phone]   Fax:  [phone]  Name: Michelle Cisneros MRN: [id number] Date of Birth: [birth year]

## 2020-08-11 ENCOUNTER — Other Ambulatory Visit: Payer: Self-pay

## 2020-08-11 ENCOUNTER — Ambulatory Visit: Payer: PPO | Admitting: Urology

## 2020-08-11 VITALS — BP 120/70 | HR 80

## 2020-08-11 DIAGNOSIS — N3946 Mixed incontinence: Secondary | ICD-10-CM

## 2020-08-11 NOTE — Progress Notes (Signed)
08/11/2020 9:26 AM   Michelle Cisneros 1956-11-11 [id number]  Referring provider: Maryland Pink, MD [address] [facility] [place],  [place] [address]  Chief Complaint  Patient presents with   Urinary Incontinence    HPI: Was consulted to assess the patient recurrent bladder infections. She has had a previousmeshsling.   At baseline she leaks with coughing sneezing bending and lifting. She has urge incontinence. She denies bedwetting. She wears 4-5 pads a day sometimes damp but sometimes very wet especially if walking or more active. She voids once at night or less and every 2 hours during the day  The patient was referred for recurrent urinary tract infections and what appears to be microscopic hematuria. 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   CT scan within normal limits. She might have a mild right ureteropelvic junction issue or just an extrarenal pelvis. Contrast excretion and uptake was similar in both kidneys  Pelvic examination: Small grade 2 cystocele with mild central defect and no rectocele. Grade 1 hypermobility of the bladder neck and no stress incontinence with a light cough. Moderate vaginal atrophy  Cystoscopy:normal  I believe the patient has not had a bladder infection but she only took it for 1 month because I think she thought it was going to help her incontinence. She has had at least one bladder operation. We talked about incontinence and bladder overactivity and stress incontinence. She is somewhat willing to live with the problem and does not want to go through a lot of cost and tests but then again does not necessarily like to live with the problem  The trimethoprim may have made the incontinence or flow worse and I educated her about this  Patient saw nurse practitioner and failed Myrbetriq.  Chose conservative therapy and stayed on daily trimethoprim.  Today Mild incontinence stable.  Watchful  waiting chosen Infection free on daily trimethoprim.  90x3 sent. Clinically not infected today Frequency stable     PMH: Past Medical History:  Diagnosis Date   Arthritis    Back pain    Collagen vascular disease ([place])    Depression    Hyperlipemia    Hypertension    Melanoma ([place])    Recurrent UTI     Surgical History: Past Surgical History:  Procedure Laterality Date   ABDOMINAL HYSTERECTOMY     COLONOSCOPY WITH PROPOFOL N/A 05/26/2020   Procedure: COLONOSCOPY WITH PROPOFOL;  Surgeon: Toledo, Benay Pike, MD;  Location: [facility] ENDOSCOPY;  Service: Gastroenterology;  Laterality: N/A;   INCONTINENCE SURGERY     OOPHORECTOMY      Home Medications:  Allergies as of 08/11/2020   No Known Allergies     Medication List       Accurate as of August 11, 2020  9:26 AM. If you have any questions, ask your nurse or doctor.        aspirin EC 81 MG tablet Take 1 tablet (81 mg total) by mouth daily.   atorvastatin 10 MG tablet Commonly known as: LIPITOR TAKE ONE TABLET BY MOUTH DAILY   buPROPion 300 MG 24 hr tablet Commonly known as: WELLBUTRIN XL TAKE ONE TABLET BY MOUTH DAILY   Calcium Carbonate-Vitamin D 600-400 MG-UNIT tablet Take by mouth.   Fish Oil 1000 MG Caps Take 1 capsule by mouth daily.   fluticasone 50 MCG/ACT nasal spray Commonly known as: FLONASE Place 1 spray into both nostrils 2 (two) times daily.   lisinopril 20 MG tablet Commonly known as:  ZESTRIL Take 1 tablet (20 mg total) by mouth daily.   trimethoprim 100 MG tablet Commonly known as: TRIMPEX Take 1 tablet (100 mg total) by mouth daily.       Allergies: No Known Allergies  Family History: Family History  Problem Relation Age of Onset   Cancer Father        laryngeal   Atrial fibrillation Father    Stroke Mother    Breast cancer Neg Hx    Bladder Cancer Neg Hx    Kidney cancer Neg Hx     Social History:  reports that she quit smoking about 32 years ago. Her  smoking use included cigarettes. She has a 30.00 pack-year smoking history. She has never used smokeless tobacco. She reports current alcohol use. She reports previous drug use.  ROS:                                        Physical Exam: There were no vitals taken for this visit.  Constitutional:  Alert and oriented, No acute distress.  Laboratory Data: No results found for: WBC, HGB, HCT, MCV, PLT  Lab Results  Component Value Date   CREATININE 0.86 05/22/2020    No results found for: PSA  No results found for: TESTOSTERONE  No results found for: HGBA1C  Urinalysis    Component Value Date/Time   COLORURINE YELLOW (A) 11/22/2016 2101   APPEARANCEUR Hazy (A) 08/10/2019 0914   LABSPEC 1.016 11/22/2016 2101   PHURINE 6.0 11/22/2016 2101   GLUCOSEU Negative 08/10/2019 0914   HGBUR 3+ (A) 11/22/2016 2101   BILIRUBINUR Negative 08/10/2019 0914   KETONESUR NEGATIVE 11/22/2016 2101   PROTEINUR Negative 08/10/2019 0914   PROTEINUR 100 (A) 11/22/2016 2101   NITRITE Negative 08/10/2019 0914   NITRITE NEGATIVE 11/22/2016 2101   LEUKOCYTESUR Negative 08/10/2019 0914    Pertinent Imaging:   Assessment & Plan: Reassess 1 year  There are no diagnoses linked to this encounter.  No follow-ups on file.  Reece Packer, MD  [facility] [address], [place], [place] [address] [phone]

## 2020-08-12 ENCOUNTER — Encounter: Payer: Self-pay | Admitting: Physical Therapy

## 2020-08-12 ENCOUNTER — Ambulatory Visit: Payer: PPO | Admitting: Physical Therapy

## 2020-08-12 DIAGNOSIS — M545 Low back pain: Secondary | ICD-10-CM | POA: Diagnosis not present

## 2020-08-12 DIAGNOSIS — R293 Abnormal posture: Secondary | ICD-10-CM

## 2020-08-12 DIAGNOSIS — R269 Unspecified abnormalities of gait and mobility: Secondary | ICD-10-CM

## 2020-08-12 DIAGNOSIS — G8929 Other chronic pain: Secondary | ICD-10-CM

## 2020-08-12 DIAGNOSIS — M5386 Other specified dorsopathies, lumbar region: Secondary | ICD-10-CM

## 2020-08-12 NOTE — Therapy (Signed)
[facility] [address], [place], [address] Phone: [phone]   Fax:  [phone]  Physical Therapy Treatment  Patient Details  Name: Michelle Cisneros MRN: [id number] Date of Birth: May 12, 1956 No data recorded  Encounter Date: 08/12/2020    Past Medical History:  Diagnosis Date  . Arthritis   . Back pain   . Collagen vascular disease ([place])   . Depression   . Hyperlipemia   . Hypertension   . Melanoma ([place])   . Recurrent UTI     Past Surgical History:  Procedure Laterality Date  . ABDOMINAL HYSTERECTOMY    . COLONOSCOPY WITH PROPOFOL N/A 05/26/2020   Procedure: COLONOSCOPY WITH PROPOFOL;  Surgeon: Toledo, Benay Pike, MD;  Location: [facility] ENDOSCOPY;  Service: Gastroenterology;  Laterality: N/A;  . INCONTINENCE SURGERY    . OOPHORECTOMY      There were no vitals filed for this visit.   Subjective Assessment - 08/12/20 1607    Subjective Pt reports no pain in the low back today. Reports that she did have some pain over the weekend when she was not completing HEP but that it feels better when she does her exercises.    Pertinent History Pt is a 64 y.o. retired female referred for chronic LBP.  Pt is a year post L1 compression fracture d/t a fall from a stool onto her back (May 2020) which was the initial onset of her back pain.  Pt received PT and her condition significantly improved; since leaving PT, pt reports her LBP has been worsening and she has had little motivation to do her exercises at home.  Pt describes pain as a tiring, fatigue-like pain (Worst: 5/10, Best/Current: 0/10); reporting that it feels as if her "frame is collapsing" when she walks.  Aggravating factors include walking, standing, carrying her grandkids (3 of her 4 grandkids are <4 y.o.), floor transfers, and stairs.  Easing factors include sitting, laying down, and pain medications (advil) as needed on days that she plans to move more.  Pt  has a hx of LBP and stenosis noted on imaging taken on 06/04/2019.  Pt reports sx are localized in her low back with no radiating sx and no reports of N/T; hx of sciatica noted after a fall in her 65s but has since resolved.  Pt reports a high fear of falling and avoidance of many activities d/t condition.  Prior to injury, pt was a marathon walker and walked 5 miles a day and did yoga 2-3x a week.  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  Pt denies N/V, B&B changes, unexplained weight fluctuation, saddle paresthesia, fever, night sweats, or unrelenting night pain at this time.    Limitations Standing;Walking;House hold activities    How long can you sit comfortably? Unlimited    How long can you stand comfortably? 5-10 minutes    How long can you walk comfortably? 5 minutes    Patient Stated Goals ability to walk 30 minutes, strengthening my core    Pain Onset More than a month ago               Ther-Ex - Nustep L3 UE setting 8; LE 7 69mins for gentle rotation and low grade strengthening/endurance  - Lower lumbar rotations x20 2sec hold  - SKTC with CL LE ext 30sec bilat - Qped alt birddog x10 ;  with 3# AW 2x 10 with cuing for set up for neutral spine with good carry over - Hip hinge x12 with good carry over; with 5# DB 2x 10 with consistent cuing for neutral spine with good carry over - Mini squatwith 10# DB swing 3x 10 with light "sit" needed for proper technique of therex with decent carry over - OMEGA hip ext 40# x10; 55# 2x 10 bilat with cuing for eccentric control with good carry over - Reverse walking 24ft x2 with cuing for large step for increased hip ext with good carry over -Seated lumbar ext with towel roll x20 2-3sec hold                         PT Short Term Goals - 07/08/20 1622      PT SHORT TERM GOAL #1   Title Pt will be independent with HEP in order to  improve strength and decrease back pain in order to improve pain-free function at home and work.    Baseline 07/08/20 Educated on HEP and handout given    Time 4    Period Weeks    Status New    Target Date 08/05/20             PT Long Term Goals - 07/08/20 1625      PT LONG TERM GOAL #1   Title Pt will increase FOTO score to 57 to indicate improved funcitonal mobility with ADLs.    Baseline 07/08/20 FOTO 47    Time 8    Period Weeks    Status New    Target Date 09/02/20      PT LONG TERM GOAL #2   Title Pt will decrease worst back pain as reported on NPRS by at least 2 points in order to demonstrate clinically significant reduction in back pain.    Baseline 07/08/20 Worst 5/10    Time 8    Period Weeks    Status New      PT LONG TERM GOAL #3   Title Pt will be able to ambulate for 30 minutes with pain <3/10 for improved ambulation endurance with less pain.    Baseline 07/08/20 Amb brings on worst pain 5/10, pain onsets at 5 minutes    Time 8    Period Weeks    Status New    Target Date 09/02/20      PT LONG TERM GOAL #4   Title Pt will decrease 5xSTS to 12 seconds or less to demonstrate signficantly improved LE strength similar to age matched norms.    Baseline 07/08/20 5xSTS 16 sec    Time 8    Period Weeks    Status New    Target Date 09/02/20      PT LONG TERM GOAL #5   Title Pt will increase gait speed to at least 1.41m/s for full community ambulation.    Baseline 07/08/20 32m/s    Time 8    Period Weeks    Status New                  Patient will benefit from skilled therapeutic intervention in order to improve the following deficits and impairments:     Visit Diagnosis: No diagnosis found.     Problem List Patient Active Problem List   Diagnosis Date Noted  . Dyspnea on exertion 04/19/2020  . Aortic atherosclerosis ([place]) 04/19/2020  . Essential hypertension 04/19/2020  . Hyperlipidemia 04/19/2020  .  Right carpal tunnel syndrome 11/09/2016    Durwin Reges DPT Durwin Reges 08/12/2020, 4:09 PM  [facility] [address], [place], [address] Phone: [phone]   Fax:  [phone]  Name: Ai Sonnenfeld MRN: [id number] Date of Birth: Aug 17, 1956

## 2020-08-14 ENCOUNTER — Other Ambulatory Visit: Payer: Self-pay

## 2020-08-14 ENCOUNTER — Ambulatory Visit: Payer: PPO | Admitting: Physical Therapy

## 2020-08-14 ENCOUNTER — Encounter: Payer: Self-pay | Admitting: Physical Therapy

## 2020-08-14 DIAGNOSIS — R293 Abnormal posture: Secondary | ICD-10-CM

## 2020-08-14 DIAGNOSIS — M545 Low back pain, unspecified: Secondary | ICD-10-CM

## 2020-08-14 DIAGNOSIS — M5386 Other specified dorsopathies, lumbar region: Secondary | ICD-10-CM

## 2020-08-14 DIAGNOSIS — R269 Unspecified abnormalities of gait and mobility: Secondary | ICD-10-CM

## 2020-08-14 NOTE — Therapy (Signed)
[facility] [address] [place], [address] Phone: [phone]   Fax:  [phone]  Physical Therapy Treatment/Progress Note Reporting Period 07/08/20 - 08/14/20  Patient Details  Name: Michelle Cisneros MRN: [id number] Date of Birth: 31-Mar-1956 No data recorded  Encounter Date: 08/14/2020   PT End of Session - 08/14/20 0954    Visit Number 10    Number of Visits 16    Date for PT Re-Evaluation 09/02/20    PT Start Time 0950    PT Stop Time 1030    PT Time Calculation (min) 40 min    Activity Tolerance Patient tolerated treatment well;No increased pain    Behavior During Therapy WFL for tasks assessed/performed           Past Medical History:  Diagnosis Date  . Arthritis   . Back pain   . Collagen vascular disease ([place])   . Depression   . Hyperlipemia   . Hypertension   . Melanoma ([place])   . Recurrent UTI     Past Surgical History:  Procedure Laterality Date  . ABDOMINAL HYSTERECTOMY    . COLONOSCOPY WITH PROPOFOL N/A 05/26/2020   Procedure: COLONOSCOPY WITH PROPOFOL;  Surgeon: Toledo, Benay Pike, MD;  Location: [facility];  Service: Gastroenterology;  Laterality: N/A;  . INCONTINENCE SURGERY    . OOPHORECTOMY      There were no vitals filed for this visit.   Subjective Assessment - 08/14/20 0956    Subjective Patient reports her back is better today, and she has no pain this am. She walked 39mins last night without pain, though she did take a break halfway to stretch.    Pertinent History Pt is a 64 y.o. retired female referred for chronic LBP.  Pt is a year post L1 compression fracture d/t a fall from a stool onto her back (May 2020) which was the initial onset of her back pain.  Pt received PT and her condition significantly improved; since leaving PT, pt reports her LBP has been worsening and she has had little motivation to do her exercises at home.  Pt describes pain as a tiring,  fatigue-like pain (Worst: 5/10, Best/Current: 0/10); reporting that it feels as if her "frame is collapsing" when she walks.  Aggravating factors include walking, standing, carrying her grandkids (3 of her 4 grandkids are <4 y.o.), floor transfers, and stairs.  Easing factors include sitting, laying down, and pain medications (advil) as needed on days that she plans to move more.  Pt has a hx of LBP and stenosis noted on imaging taken on 06/04/2019.  Pt reports sx are localized in her low back with no radiating sx and no reports of N/T; hx of sciatica noted after a fall in her 91s but has since resolved.  Pt reports a high fear of falling and avoidance of many activities d/t condition.  Prior to injury, pt was a marathon walker and walked 5 miles a day and did yoga 2-3x a week.  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  Pt denies N/V, B&B changes, unexplained weight fluctuation, saddle paresthesia, fever, night sweats, or unrelenting night pain at this time.    Limitations Standing;Walking;House hold activities    How long can you sit comfortably? Unlimited    How long can you stand comfortably? 5-10 minutes    How long can  you walk comfortably? 5 minutes    Patient Stated Goals ability to walk 30 minutes, strengthening my core    Pain Onset More than a month ago           Ther-Ex - Nustep L3 UE setting 8; LE 7 78mins for gentle rotation and low grade strengthening/endurance - Review of squat to chair for HEP 5xSTS 2 trials, best time: 10.42sec PT reviewed the following HEP with patient with patient able to demonstrate 2 sets of the following with min cuing for correction needed. PT educated patient on parameters of therex (how/when to inc/decrease intensity, frequency, rep/set range, stretch hold time, and purpose of therex) with verbalized understanding. PT educated patient on achieved goals and  remaining endurance deficits to explaining reasoning behind rep increase to 12 reps. Patient able to verbalize understanding Access Code: [place] with Counter Support - 1 x daily - 2-3 x weekly - 3 sets - 12 reps Standing Hip Extension with Resistance at Ankles and Counter Support - 1 x daily - 2-3 x weekly - 3 sets - 10 reps Supine Bridge with Resistance Band - 1 x daily - 2-3 x weekly - 3 sets - 10 reps Modified Thomas Stretch - 3 x daily - 7 x weekly - 60sec hold Standing Single Leg Stance with Counter Support - 3 x daily - 7 x weekly - 30sec hold                           PT Education - 08/14/20 0959    Education Details therex form/technique    Person(s) Educated Patient    Methods Explanation;Demonstration;Verbal cues    Comprehension Verbalized understanding;Verbal cues required;Returned demonstration            PT Short Term Goals - 08/14/20 0959      PT SHORT TERM GOAL #1   Title Pt will be independent with HEP in order to improve strength and decrease back pain in order to improve pain-free function at home and work.    Baseline 07/08/20 Educated on HEP and handout given; 08/14/20 understands HEP, completing 50%    Time 4    Period Weeks    Status On-going    Target Date 08/05/20             PT Long Term Goals - 08/14/20 1002      PT LONG TERM GOAL #1   Title Pt will increase FOTO score to 57 to indicate improved funcitonal mobility with ADLs.    Baseline 07/08/20 FOTO 47; 08/14/20 50    Time 8    Period Weeks      PT LONG TERM GOAL #2   Title Pt will decrease worst back pain as reported on NPRS by at least 2 points in order to demonstrate clinically significant reduction in back pain.    Baseline 07/08/20 Worst 5/10; 08/14/20 5/10 less frequently    Time 8    Period Weeks    Status On-going      PT LONG TERM GOAL #3   Title Pt will be able to ambulate for 30 minutes with pain <3/10 for improved ambulation endurance with less  pain.    Baseline 07/08/20 Amb brings on worst pain 5/10, pain onsets at 5 minutes; 08/14/20 Walked 53mins without pain, had to take a short break    Time 8    Period Weeks    Status On-going  PT LONG TERM GOAL #4   Title Pt will decrease 5xSTS to 12 seconds or less to demonstrate signficantly improved LE strength similar to age matched norms.    Baseline 07/08/20 5xSTS 16 sec; 08/14/20 10.42sec    Time 8    Period Weeks    Status Achieved      PT LONG TERM GOAL #5   Title Pt will increase gait speed to at least 1.63m/s for full community ambulation.    Baseline 07/08/20 47m/s; 08/14/20 1.37m/s    Time 8    Period Weeks    Status Achieved                 Plan - 08/14/20 1051    Clinical Impression Statement PT reassessed goals this session where pt has demonstrated good progress with BLE strength and gait speed, with remaining deficits in endurance and LBP with increased standing activity. PT updated HEP to reflect progress toward increased endurance with patient able to demonstrate and verbalize understanding of. PT will continue progression toward identified impairments as able.    Personal Factors and Comorbidities Age;Comorbidity 1    Comorbidities HTN    Examination-Activity Limitations Stand;Transfers;Patent attorney for Others;Squat;Stairs    Examination-Participation Restrictions Community Activity;Interpersonal Relationship;Shop;Cleaning    Stability/Clinical Decision Making Evolving/Moderate complexity    Clinical Decision Making Moderate    Rehab Potential Good    PT Frequency 2x / week    PT Duration 6 weeks    PT Treatment/Interventions ADLs/Self Care Home Management;Cryotherapy;Electrical Stimulation;Moist Heat;Traction;Gait training;Stair training;Functional mobility training;Therapeutic activities;Therapeutic exercise;Patient/family education;Cognitive remediation;Neuromuscular re-education;Balance training;Manual techniques    PT Next Visit Plan continue  to work on lumbar mobility, and hip/core strengthening    PT Home Exercise Plan glute bridge, hip flexor stretch, standing hip ext, mini squat, SLS    Consulted and Agree with Plan of Care Patient           Patient will benefit from skilled therapeutic intervention in order to improve the following deficits and impairments:  Abnormal gait, Improper body mechanics, Pain, Decreased coordination, Decreased mobility, Postural dysfunction, Decreased activity tolerance, Decreased endurance, Decreased range of motion, Decreased strength, Hypomobility, Impaired perceived functional ability, Difficulty walking, Impaired flexibility  Visit Diagnosis: Chronic midline low back pain without sciatica  Decreased ROM of lumbar spine  Abnormal posture  Abnormality of gait and mobility     Problem List Patient Active Problem List   Diagnosis Date Noted  . Dyspnea on exertion 04/19/2020  . Aortic atherosclerosis ([place]) 04/19/2020  . Essential hypertension 04/19/2020  . Hyperlipidemia 04/19/2020  . Right carpal tunnel syndrome 11/09/2016   Durwin Reges DPT Durwin Reges 08/14/2020, 10:56 AM  [facility] [address], [place], [address] Phone: [phone]   Fax:  [phone]  Name: Asucena Galer MRN: [id number] Date of Birth: Aug 31, 1956

## 2020-08-18 ENCOUNTER — Ambulatory Visit: Payer: PPO | Admitting: Physical Therapy

## 2020-08-19 ENCOUNTER — Encounter: Payer: PPO | Admitting: Physical Therapy

## 2020-08-20 ENCOUNTER — Ambulatory Visit: Payer: PPO | Attending: Family Medicine | Admitting: Physical Therapy

## 2020-08-20 ENCOUNTER — Encounter: Payer: Self-pay | Admitting: Physical Therapy

## 2020-08-20 ENCOUNTER — Other Ambulatory Visit: Payer: Self-pay

## 2020-08-20 DIAGNOSIS — G8929 Other chronic pain: Secondary | ICD-10-CM | POA: Insufficient documentation

## 2020-08-20 DIAGNOSIS — R269 Unspecified abnormalities of gait and mobility: Secondary | ICD-10-CM | POA: Insufficient documentation

## 2020-08-20 DIAGNOSIS — R293 Abnormal posture: Secondary | ICD-10-CM | POA: Diagnosis not present

## 2020-08-20 DIAGNOSIS — M5386 Other specified dorsopathies, lumbar region: Secondary | ICD-10-CM | POA: Insufficient documentation

## 2020-08-20 DIAGNOSIS — M545 Low back pain, unspecified: Secondary | ICD-10-CM

## 2020-08-20 NOTE — Therapy (Signed)
[facility] [address], [place], [address] Phone: [phone]   Fax:  [phone]  Physical Therapy Treatment  Patient Details  Name: Michelle Cisneros MRN: [id number] Date of Birth: [birth year] No data recorded  Encounter Date: 08/20/2020   PT End of Session - 08/20/20 1306    Visit Number 11    Number of Visits 16    Date for PT Re-Evaluation 09/02/20    PT Start Time 0100    PT Stop Time 0140    PT Time Calculation (min) 40 min    Activity Tolerance Patient tolerated treatment well;No increased pain    Behavior During Therapy WFL for tasks assessed/performed           Past Medical History:  Diagnosis Date   Arthritis    Back pain    Collagen vascular disease ([place])    Depression    Hyperlipemia    Hypertension    Melanoma ([place])    Recurrent UTI     Past Surgical History:  Procedure Laterality Date   ABDOMINAL HYSTERECTOMY     COLONOSCOPY WITH PROPOFOL N/A 05/26/2020   Procedure: COLONOSCOPY WITH PROPOFOL;  Surgeon: Toledo, Benay Pike, MD;  Location: [facility];  Service: Gastroenterology;  Laterality: N/A;   INCONTINENCE SURGERY     OOPHORECTOMY      There were no vitals filed for this visit.   Subjective Assessment - 08/20/20 1303    Subjective Pt reports no pain today. Reports she has gotten up to 72mins with her walking without break, though she is pushing the stoller.    Pertinent History Pt is a 64 y.o. retired female referred for chronic LBP.  Pt is a year post L1 compression fracture d/t a fall from a stool onto her back (May 2020) which was the initial onset of her back pain.  Pt received PT and her condition significantly improved; since leaving PT, pt reports her LBP has been worsening and she has had little motivation to do her exercises at home.  Pt describes pain as a tiring, fatigue-like pain (Worst: 5/10, Best/Current: 0/10); reporting that it feels as if her  frame is collapsing when she walks.  Aggravating factors include walking, standing, carrying her grandkids (3 of her 4 grandkids are <4 y.o.), floor transfers, and stairs.  Easing factors include sitting, laying down, and pain medications (advil) as needed on days that she plans to move more.  Pt has a hx of LBP and stenosis noted on imaging taken on 06/04/2019.  Pt reports sx are localized in her low back with no radiating sx and no reports of N/T; hx of sciatica noted after a fall in her 66s but has since resolved.  Pt reports a high fear of falling and avoidance of many activities d/t condition.  Prior to injury, pt was a marathon walker and walked 5 miles a day and did yoga 2-3x a week.  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  Pt denies N/V, B&B changes, unexplained weight fluctuation, saddle paresthesia, fever, night sweats, or unrelenting night pain at this time.    Limitations Standing;Walking;House hold activities    How long can you sit comfortably? Unlimited    How long can you stand comfortably? 5-10 minutes    How long can you walk comfortably? 5 minutes    Patient Stated Goals ability to  walk 30 minutes, strengthening my core    Pain Onset More than a month ago           Ther-Ex - Nustep L3 UE setting 8; LE 7 15mins for gentle rotation and low grade strengthening/endurance - SKTC with CL LE ext 30sec bilat - Bridge GTB x10; BluTB 2x 10 with min cuing for full height with good carry over - Prone hip ext knee flex 2x 10 bilat with cuing for glute contraction with decent carry over - Mini squatwith 10# DB swing 3x 10 with cuing for increased ROM with good carry over - Alt reverse mini lunge 3x 10 with max cuing for technique initially with good carry over following - OMEGA hip ext 40# x10; 55# 2x 10 with min cuing for posture with good carry over -Seated lumbar ext with towel roll x20  2-3sec hold                          PT Education - 08/20/20 1306    Education Details therex form/technique    Person(s) Educated Patient    Methods Explanation;Demonstration;Verbal cues    Comprehension Verbalized understanding;Returned demonstration;Verbal cues required            PT Short Term Goals - 08/14/20 0959      PT SHORT TERM GOAL #1   Title Pt will be independent with HEP in order to improve strength and decrease back pain in order to improve pain-free function at home and work.    Baseline 07/08/20 Educated on HEP and handout given; 08/14/20 understands HEP, completing 50%    Time 4    Period Weeks    Status On-going    Target Date 08/05/20             PT Long Term Goals - 08/14/20 1002      PT LONG TERM GOAL #1   Title Pt will increase FOTO score to 57 to indicate improved funcitonal mobility with ADLs.    Baseline 07/08/20 FOTO 47; 08/14/20 50    Time 8    Period Weeks      PT LONG TERM GOAL #2   Title Pt will decrease worst back pain as reported on NPRS by at least 2 points in order to demonstrate clinically significant reduction in back pain.    Baseline 07/08/20 Worst 5/10; 08/14/20 5/10 less frequently    Time 8    Period Weeks    Status On-going      PT LONG TERM GOAL #3   Title Pt will be able to ambulate for 30 minutes with pain <3/10 for improved ambulation endurance with less pain.    Baseline 07/08/20 Amb brings on worst pain 5/10, pain onsets at 5 minutes; 08/14/20 Walked 63mins without pain, had to take a short break    Time 8    Period Weeks    Status On-going      PT LONG TERM GOAL #4   Title Pt will decrease 5xSTS to 12 seconds or less to demonstrate signficantly improved LE strength similar to age matched norms.    Baseline 07/08/20 5xSTS 16 sec; 08/14/20 10.42sec    Time 8    Period Weeks    Status Achieved      PT LONG TERM GOAL #5   Title Pt will increase gait speed to at least 1.62m/s for full community  ambulation.    Baseline 07/08/20 66m/s; 08/14/20 1.44m/s    Time  8    Period Weeks    Status Achieved                 Plan - 08/20/20 1321    Clinical Impression Statement PT continued therex progression for increased hip ext strength and increased activity tolerance with good success. Patient is able to comply with all cuing for proper technique of therex and is motivated throughout session without increased pain. PT will continue progression as able.    Personal Factors and Comorbidities Age;Comorbidity 1    Comorbidities HTN    Examination-Activity Limitations Stand;Transfers;Patent attorney for Others;Squat;Stairs    Examination-Participation Restrictions Community Activity;Interpersonal Relationship;Shop;Cleaning    Stability/Clinical Decision Making Evolving/Moderate complexity    Clinical Decision Making Moderate    Rehab Potential Good    PT Frequency 2x / week    PT Duration 6 weeks    PT Treatment/Interventions ADLs/Self Care Home Management;Cryotherapy;Electrical Stimulation;Moist Heat;Traction;Gait training;Stair training;Functional mobility training;Therapeutic activities;Therapeutic exercise;Patient/family education;Cognitive remediation;Neuromuscular re-education;Balance training;Manual techniques    PT Next Visit Plan continue to work on lumbar mobility, and hip/core strengthening    PT Home Exercise Plan glute bridge, hip flexor stretch, standing hip ext, mini squat, SLS    Consulted and Agree with Plan of Care Patient           Patient will benefit from skilled therapeutic intervention in order to improve the following deficits and impairments:  Abnormal gait, Improper body mechanics, Pain, Decreased coordination, Decreased mobility, Postural dysfunction, Decreased activity tolerance, Decreased endurance, Decreased range of motion, Decreased strength, Hypomobility, Impaired perceived functional ability, Difficulty walking, Impaired flexibility  Visit  Diagnosis: Chronic midline low back pain without sciatica  Decreased ROM of lumbar spine  Abnormal posture  Abnormality of gait and mobility     Problem List Patient Active Problem List   Diagnosis Date Noted   Dyspnea on exertion 04/19/2020   Aortic atherosclerosis ([place]) 04/19/2020   Essential hypertension 04/19/2020   Hyperlipidemia 04/19/2020   Right carpal tunnel syndrome 11/09/2016   Durwin Reges DPT Durwin Reges 08/20/2020, 1:40 PM  [facility] [address], [place], [address] Phone: [phone]   Fax:  [phone]  Name: Michelle Cisneros MRN: [id number] Date of Birth: 12-May-1956

## 2020-08-21 ENCOUNTER — Encounter: Payer: PPO | Admitting: Physical Therapy

## 2020-08-26 ENCOUNTER — Other Ambulatory Visit: Payer: Self-pay

## 2020-08-26 ENCOUNTER — Encounter: Payer: Self-pay | Admitting: Physical Therapy

## 2020-08-26 ENCOUNTER — Ambulatory Visit: Payer: PPO | Admitting: Physical Therapy

## 2020-08-26 DIAGNOSIS — M545 Low back pain, unspecified: Secondary | ICD-10-CM

## 2020-08-26 DIAGNOSIS — R293 Abnormal posture: Secondary | ICD-10-CM

## 2020-08-26 DIAGNOSIS — R269 Unspecified abnormalities of gait and mobility: Secondary | ICD-10-CM

## 2020-08-26 DIAGNOSIS — M5386 Other specified dorsopathies, lumbar region: Secondary | ICD-10-CM

## 2020-08-26 NOTE — Therapy (Signed)
[facility] [address], [place], [address] Phone: [phone]   Fax:  [phone]  Physical Therapy Treatment  Patient Details  Name: Michelle Cisneros MRN: [id number] Date of Birth: [birth year] No data recorded  Encounter Date: 08/26/2020    Past Medical History:  Diagnosis Date  . Arthritis   . Back pain   . Collagen vascular disease ([place])   . Depression   . Hyperlipemia   . Hypertension   . Melanoma ([place])   . Recurrent UTI     Past Surgical History:  Procedure Laterality Date  . ABDOMINAL HYSTERECTOMY    . COLONOSCOPY WITH PROPOFOL N/A 05/26/2020   Procedure: COLONOSCOPY WITH PROPOFOL;  Surgeon: Toledo, Benay Pike, MD;  Location: [facility] ENDOSCOPY;  Service: Gastroenterology;  Laterality: N/A;  . INCONTINENCE SURGERY    . OOPHORECTOMY      There were no vitals filed for this visit.    Ther-Ex - Nustep L3 UE setting 8; LE 7 34mins for gentle rotation and low grade strengthening/endurance -SKTC with CL LE ext 30sec bilat - Bilat alt supermans 3x 10 with min cuing for glute contraction with good carry over - SL bridge 3x 10 bilat with heavy cuing for technique with good carry over - Alt reverse mini lunge 3x 10 with max cuing for technique initially with good carry over following - OMEGA hip ext 55# 3x 10with min cuing for posture with good carry over -Seated lumbar ext with towel roll x20 2-3sec hold  HEP review with education on benefits of strength training vs. Walking and need for both for a holistic exercise regimen with good understanding                           PT Short Term Goals - 08/14/20 0959      PT SHORT TERM GOAL #1   Title Pt will be independent with HEP in order to improve strength and decrease back pain in order to improve pain-free function at home and work.    Baseline 07/08/20 Educated on HEP and handout given; 08/14/20 understands HEP, completing 50%     Time 4    Period Weeks    Status On-going    Target Date 08/05/20             PT Long Term Goals - 08/14/20 1002      PT LONG TERM GOAL #1   Title Pt will increase FOTO score to 57 to indicate improved funcitonal mobility with ADLs.    Baseline 07/08/20 FOTO 47; 08/14/20 50    Time 8    Period Weeks      PT LONG TERM GOAL #2   Title Pt will decrease worst back pain as reported on NPRS by at least 2 points in order to demonstrate clinically significant reduction in back pain.    Baseline 07/08/20 Worst 5/10; 08/14/20 5/10 less frequently    Time 8    Period Weeks    Status On-going      PT LONG TERM GOAL #3   Title Pt will be able to ambulate for 30 minutes with pain <3/10 for improved ambulation endurance with less pain.    Baseline 07/08/20 Amb brings on worst pain 5/10, pain onsets at 5 minutes; 08/14/20 Walked 24mins without pain, had to take a short break    Time 8    Period Weeks    Status On-going  PT LONG TERM GOAL #4   Title Pt will decrease 5xSTS to 12 seconds or less to demonstrate signficantly improved LE strength similar to age matched norms.    Baseline 07/08/20 5xSTS 16 sec; 08/14/20 10.42sec    Time 8    Period Weeks    Status Achieved      PT LONG TERM GOAL #5   Title Pt will increase gait speed to at least 1.34m/s for full community ambulation.    Baseline 07/08/20 59m/s; 08/14/20 1.35m/s    Time 8    Period Weeks    Status Achieved                  Patient will benefit from skilled therapeutic intervention in order to improve the following deficits and impairments:     Visit Diagnosis: No diagnosis found.     Problem List Patient Active Problem List   Diagnosis Date Noted  . Dyspnea on exertion 04/19/2020  . Aortic atherosclerosis ([place]) 04/19/2020  . Essential hypertension 04/19/2020  . Hyperlipidemia 04/19/2020  . Right carpal tunnel syndrome 11/09/2016   Durwin Reges DPT Durwin Reges 08/26/2020, 9:49 AM  [facility] [address] [place], [address] Phone: [phone]   Fax:  [phone]  Name: Michelle Cisneros MRN: [id number] Date of Birth: [birth year]

## 2020-08-28 ENCOUNTER — Encounter: Payer: PPO | Admitting: Physical Therapy

## 2020-08-28 ENCOUNTER — Encounter: Payer: Self-pay | Admitting: Physical Therapy

## 2020-08-28 ENCOUNTER — Other Ambulatory Visit: Payer: Self-pay

## 2020-08-28 ENCOUNTER — Ambulatory Visit: Payer: PPO | Admitting: Physical Therapy

## 2020-08-28 DIAGNOSIS — M545 Low back pain: Secondary | ICD-10-CM | POA: Diagnosis not present

## 2020-08-28 DIAGNOSIS — R293 Abnormal posture: Secondary | ICD-10-CM

## 2020-08-28 DIAGNOSIS — G8929 Other chronic pain: Secondary | ICD-10-CM

## 2020-08-28 DIAGNOSIS — M5386 Other specified dorsopathies, lumbar region: Secondary | ICD-10-CM

## 2020-08-28 DIAGNOSIS — R269 Unspecified abnormalities of gait and mobility: Secondary | ICD-10-CM

## 2020-08-28 NOTE — Therapy (Signed)
[facility] [address] [place], [address] Phone: [phone]   Fax:  [phone]  Physical Therapy Treatment  Patient Details  Name: Michelle Cisneros MRN: [id number] Date of Birth: [birth year] No data recorded  Encounter Date: 08/28/2020   PT End of Session - 08/28/20 1636    Visit Number 13    Number of Visits 16    Date for PT Re-Evaluation 09/02/20    PT Start Time 0432    PT Stop Time 0510    PT Time Calculation (min) 38 min    Activity Tolerance Patient tolerated treatment well;No increased pain    Behavior During Therapy WFL for tasks assessed/performed           Past Medical History:  Diagnosis Date  . Arthritis   . Back pain   . Collagen vascular disease ([place])   . Depression   . Hyperlipemia   . Hypertension   . Melanoma ([place])   . Recurrent UTI     Past Surgical History:  Procedure Laterality Date  . ABDOMINAL HYSTERECTOMY    . COLONOSCOPY WITH PROPOFOL N/A 05/26/2020   Procedure: COLONOSCOPY WITH PROPOFOL;  Surgeon: Toledo, Benay Pike, MD;  Location: [facility] ENDOSCOPY;  Service: Gastroenterology;  Laterality: N/A;  . INCONTINENCE SURGERY    . OOPHORECTOMY      There were no vitals filed for this visit.   Subjective Assessment - 08/28/20 1629    Subjective Patient reports no back pain today. Reports she has not been able to walk d/t weather, but has been completing HEP.    Pertinent History Pt is a 64 y.o. retired female referred for chronic LBP.  Pt is a year post L1 compression fracture d/t a fall from a stool onto her back (May 2020) which was the initial onset of her back pain.  Pt received PT and her condition significantly improved; since leaving PT, pt reports her LBP has been worsening and she has had little motivation to do her exercises at home.  Pt describes pain as a tiring, fatigue-like pain (Worst: 5/10, Best/Current: 0/10); reporting that it feels as if her "frame is  collapsing" when she walks.  Aggravating factors include walking, standing, carrying her grandkids (3 of her 4 grandkids are <4 y.o.), floor transfers, and stairs.  Easing factors include sitting, laying down, and pain medications (advil) as needed on days that she plans to move more.  Pt has a hx of LBP and stenosis noted on imaging taken on 06/04/2019.  Pt reports sx are localized in her low back with no radiating sx and no reports of N/T; hx of sciatica noted after a fall in her 38s but has since resolved.  Pt reports a high fear of falling and avoidance of many activities d/t condition.  Prior to injury, pt was a marathon walker and walked 5 miles a day and did yoga 2-3x a week.  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  Pt denies N/V, B&B changes, unexplained weight fluctuation, saddle paresthesia, fever, night sweats, or unrelenting night pain at this time.    Limitations Standing;Walking;House hold activities    How long can you sit comfortably? Unlimited    How long can you stand comfortably? 5-10 minutes    How long can you walk comfortably? 5 minutes    Patient Stated Goals ability to walk 30  minutes, strengthening my core    Pain Onset More than a month ago           Ther-Ex - Nustep L3 UE setting 8; LE 7 26mins for gentle rotation and low grade strengthening/endurance - Alt reverse mini lunge 3x 10 with max cuing for technique initially with good carry over following - Step up <> eccentric lower with 6in step 2x 10 with heavy cuing for proper technique with out excessive knee motion with difficulty with LLE - OMEGA hip ext 55# 3x 10with min cuing for posture with good carry over - Bilat alt supermans 3x 10 with min cuing for glute contraction with good carry over -SKTC with CL LE ext 30sec bilat -Seated lumbar ext with towel roll x20 2-3sec hold          PT Education - 08/28/20  1635    Education Details therex form/technique    Person(s) Educated Patient    Methods Explanation;Demonstration;Verbal cues    Comprehension Verbalized understanding;Returned demonstration;Verbal cues required            PT Short Term Goals - 08/14/20 0959      PT SHORT TERM GOAL #1   Title Pt will be independent with HEP in order to improve strength and decrease back pain in order to improve pain-free function at home and work.    Baseline 07/08/20 Educated on HEP and handout given; 08/14/20 understands HEP, completing 50%    Time 4    Period Weeks    Status On-going    Target Date 08/05/20             PT Long Term Goals - 08/14/20 1002      PT LONG TERM GOAL #1   Title Pt will increase FOTO score to 57 to indicate improved funcitonal mobility with ADLs.    Baseline 07/08/20 FOTO 47; 08/14/20 50    Time 8    Period Weeks      PT LONG TERM GOAL #2   Title Pt will decrease worst back pain as reported on NPRS by at least 2 points in order to demonstrate clinically significant reduction in back pain.    Baseline 07/08/20 Worst 5/10; 08/14/20 5/10 less frequently    Time 8    Period Weeks    Status On-going      PT LONG TERM GOAL #3   Title Pt will be able to ambulate for 30 minutes with pain <3/10 for improved ambulation endurance with less pain.    Baseline 07/08/20 Amb brings on worst pain 5/10, pain onsets at 5 minutes; 08/14/20 Walked 58mins without pain, had to take a short break    Time 8    Period Weeks    Status On-going      PT LONG TERM GOAL #4   Title Pt will decrease 5xSTS to 12 seconds or less to demonstrate signficantly improved LE strength similar to age matched norms.    Baseline 07/08/20 5xSTS 16 sec; 08/14/20 10.42sec    Time 8    Period Weeks    Status Achieved      PT LONG TERM GOAL #5   Title Pt will increase gait speed to at least 1.67m/s for full community ambulation.    Baseline 07/08/20 59m/s; 08/14/20 1.46m/s    Time 8    Period Weeks    Status  Achieved                 Plan - 08/28/20 1637  Clinical Impression Statement PT continued therex progression for increased hip and core strength with increased upright demand with good success. Patient requires multimodal cuing for proper technique of therex with good carry over following. PT will continue progression as able.    Personal Factors and Comorbidities Age;Comorbidity 1    Comorbidities HTN    Examination-Activity Limitations Stand;Transfers;Patent attorney for Others;Squat;Stairs    Examination-Participation Restrictions Community Activity;Interpersonal Relationship;Shop;Cleaning    Stability/Clinical Decision Making Evolving/Moderate complexity    Clinical Decision Making Moderate    Rehab Potential Good    PT Frequency 2x / week    PT Duration 6 weeks    PT Treatment/Interventions ADLs/Self Care Home Management;Cryotherapy;Electrical Stimulation;Moist Heat;Traction;Gait training;Stair training;Functional mobility training;Therapeutic activities;Therapeutic exercise;Patient/family education;Cognitive remediation;Neuromuscular re-education;Balance training;Manual techniques    PT Next Visit Plan continue to work on lumbar mobility, and hip/core strengthening    PT Home Exercise Plan glute bridge, hip flexor stretch, standing hip ext, mini squat, SLS           Patient will benefit from skilled therapeutic intervention in order to improve the following deficits and impairments:  Abnormal gait, Improper body mechanics, Pain, Decreased coordination, Decreased mobility, Postural dysfunction, Decreased activity tolerance, Decreased endurance, Decreased range of motion, Decreased strength, Hypomobility, Impaired perceived functional ability, Difficulty walking, Impaired flexibility  Visit Diagnosis: Chronic midline low back pain without sciatica  Decreased ROM of lumbar spine  Abnormal posture  Abnormality of gait and mobility     Problem List Patient  Active Problem List   Diagnosis Date Noted  . Dyspnea on exertion 04/19/2020  . Aortic atherosclerosis ([place]) 04/19/2020  . Essential hypertension 04/19/2020  . Hyperlipidemia 04/19/2020  . Right carpal tunnel syndrome 11/09/2016   Durwin Reges DPT Durwin Reges 08/28/2020, 5:06 PM  [facility] [address], [place], [address] Phone: [phone]   Fax:  [phone]  Name: Michelle Cisneros MRN: [id number] Date of Birth: 07/11/1956

## 2020-09-02 ENCOUNTER — Ambulatory Visit: Payer: PPO | Admitting: Physical Therapy

## 2020-09-02 ENCOUNTER — Other Ambulatory Visit: Payer: Self-pay

## 2020-09-02 DIAGNOSIS — M545 Low back pain, unspecified: Secondary | ICD-10-CM

## 2020-09-02 DIAGNOSIS — M5386 Other specified dorsopathies, lumbar region: Secondary | ICD-10-CM

## 2020-09-02 DIAGNOSIS — R293 Abnormal posture: Secondary | ICD-10-CM

## 2020-09-02 DIAGNOSIS — R269 Unspecified abnormalities of gait and mobility: Secondary | ICD-10-CM

## 2020-09-02 NOTE — Therapy (Signed)
[facility] [address], [place], [address] Phone: [phone]   Fax:  [phone]  Physical Therapy Treatment  Patient Details  Name: Michelle Cisneros MRN: [id number] Date of Birth: 1956/04/06 No data recorded  Encounter Date: 09/02/2020   PT End of Session - 09/02/20 1718    Visit Number 14    Number of Visits 16    Date for PT Re-Evaluation 09/02/20    PT Start Time 0504    PT Stop Time 0543    PT Time Calculation (min) 39 min    Activity Tolerance Patient tolerated treatment well;No increased pain    Behavior During Therapy WFL for tasks assessed/performed           Past Medical History:  Diagnosis Date   Arthritis    Back pain    Collagen vascular disease ([place])    Depression    Hyperlipemia    Hypertension    Melanoma ([place])    Recurrent UTI     Past Surgical History:  Procedure Laterality Date   ABDOMINAL HYSTERECTOMY     COLONOSCOPY WITH PROPOFOL N/A 05/26/2020   Procedure: COLONOSCOPY WITH PROPOFOL;  Surgeon: Toledo, Benay Pike, MD;  Location: [facility]OSCOPY;  Service: Gastroenterology;  Laterality: N/A;   INCONTINENCE SURGERY     OOPHORECTOMY      There were no vitals filed for this visit.   Ther-Ex - Nustep L3 UE setting 8; LE 7 51mins for gentle rotation and low grade strengthening/endurance - Mini squat with 7# DB swing 3x 12 with demo and cuing needed initially for sequencing with good carry over folloiwng - Step up <> eccentric lower with 6in step 3x12 with heavy cuing to prevent post lean and for eccentric control with good carry over - Monster walks 69ft; with YTB 3x 40ft  - OMEGA hip ext 55#2x 12with min cuing for posture with good carry over -SKTC with CL LE ext 2x 30sec bilat -Seated lumbar ext with towel roll x20 2-3sec hold                                PT Short Term Goals - 09/02/20 1706      PT SHORT TERM GOAL #1    Title Pt will be independent with HEP in order to improve strength and decrease back pain in order to improve pain-free function at home and work.    Baseline 07/08/20 Educated on HEP and handout given; 08/14/20 understands HEP, completing 50%; 09/02/20 understads, completing 75% of the time    Time 4    Period Weeks    Status On-going    Target Date 08/05/20             PT Long Term Goals - 09/02/20 1707      PT LONG TERM GOAL #1   Title Pt will increase FOTO score to 57 to indicate improved funcitonal mobility with ADLs.    Baseline 07/08/20 FOTO 47; 08/14/20 50; 09/02/20 60    Time 8    Period Weeks    Status Achieved      PT LONG TERM GOAL #2   Title Pt will decrease worst back pain as reported on NPRS by at least 2 points in order to demonstrate clinically significant reduction in back pain.    Baseline 07/08/20 Worst 5/10; 08/14/20 5/10 less frequently; 09/02/20 0/10    Time 8  Period Weeks    Status Achieved      PT LONG TERM GOAL #3   Title Pt will be able to ambulate for 30 minutes with pain <3/10 for improved ambulation endurance with less pain.    Baseline 07/08/20 Amb brings on worst pain 5/10, pain onsets at 5 minutes; 08/14/20 Walked 87mins without pain, had to take a short break; 09/02/20 able to walk 47mins with breaks without pain    Time 8    Period Weeks    Status On-going      PT LONG TERM GOAL #4   Title Pt will decrease 5xSTS to 12 seconds or less to demonstrate signficantly improved LE strength similar to age matched norms.    Baseline 07/08/20 5xSTS 16 sec; 08/14/20 10.42sec    Time 8    Period Weeks    Status Achieved      PT LONG TERM GOAL #5   Title Pt will increase gait speed to at least 1.67m/s for full community ambulation.    Baseline 07/08/20 40m/s; 08/14/20 1.55m/s    Time 8    Period Weeks    Status Achieved                 Plan - 09/02/20 1731    Clinical Impression Statement PT continued therex progression with increased endurance focus  this session, with success. Patient is able to comply with all cuing for proper technique with good motivation throughout session. PT continued to encourage patient in HEP compliance to work toward PT discharge with patient verbalizing understanding. PT will continue porgression as able.    Personal Factors and Comorbidities Age;Comorbidity 1    Comorbidities HTN    Examination-Activity Limitations Stand;Transfers;Patent attorney for Others;Squat;Stairs    Examination-Participation Restrictions Community Activity;Interpersonal Relationship;Shop;Cleaning    Stability/Clinical Decision Making Evolving/Moderate complexity    Clinical Decision Making Moderate    Rehab Potential Good    PT Frequency 2x / week    PT Duration 6 weeks    PT Treatment/Interventions ADLs/Self Care Home Management;Cryotherapy;Electrical Stimulation;Moist Heat;Traction;Gait training;Stair training;Functional mobility training;Therapeutic activities;Therapeutic exercise;Patient/family education;Cognitive remediation;Neuromuscular re-education;Balance training;Manual techniques    PT Next Visit Plan continue to work on lumbar mobility, and hip/core strengthening    PT Home Exercise Plan glute bridge, hip flexor stretch, standing hip ext, mini squat, SLS    Consulted and Agree with Plan of Care Patient           Patient will benefit from skilled therapeutic intervention in order to improve the following deficits and impairments:  Abnormal gait, Improper body mechanics, Pain, Decreased coordination, Decreased mobility, Postural dysfunction, Decreased activity tolerance, Decreased endurance, Decreased range of motion, Decreased strength, Hypomobility, Impaired perceived functional ability, Difficulty walking, Impaired flexibility  Visit Diagnosis: Chronic midline low back pain without sciatica  Decreased ROM of lumbar spine  Abnormal posture  Abnormality of gait and mobility     Problem List Patient Active  Problem List   Diagnosis Date Noted   Dyspnea on exertion 04/19/2020   Aortic atherosclerosis ([place]) 04/19/2020   Essential hypertension 04/19/2020   Hyperlipidemia 04/19/2020   Right carpal tunnel syndrome 11/09/2016   Durwin Reges DPT Durwin Reges 09/02/2020, 5:38 PM  [facility] [address], [place], [address] Phone: [phone]   Fax:  [phone]  Name: Michelle Cisneros MRN: [id number] Date of Birth: 08-Apr-1956

## 2020-09-04 ENCOUNTER — Encounter: Payer: PPO | Admitting: Physical Therapy

## 2020-09-09 ENCOUNTER — Ambulatory Visit: Payer: PPO | Admitting: Physical Therapy

## 2020-09-09 ENCOUNTER — Encounter: Payer: Self-pay | Admitting: Physical Therapy

## 2020-09-09 ENCOUNTER — Other Ambulatory Visit: Payer: Self-pay

## 2020-09-09 DIAGNOSIS — R269 Unspecified abnormalities of gait and mobility: Secondary | ICD-10-CM

## 2020-09-09 DIAGNOSIS — M545 Low back pain: Secondary | ICD-10-CM | POA: Diagnosis not present

## 2020-09-09 DIAGNOSIS — G8929 Other chronic pain: Secondary | ICD-10-CM

## 2020-09-09 DIAGNOSIS — R293 Abnormal posture: Secondary | ICD-10-CM

## 2020-09-09 NOTE — Therapy (Signed)
[place] [facility] [address] [place], [address] Phone: [phone]   Fax:  [phone]  Physical Therapy Treatment  Patient Details  Name: Michelle Cisneros MRN: [id number] Date of Birth: 05/21/1956 No data recorded  Encounter Date: 09/09/2020   PT End of Session - 09/09/20 1738    Visit Number 15    Number of Visits 21    Date for PT Re-Evaluation 09/26/20    PT Start Time 0530    PT Stop Time 0608    PT Time Calculation (min) 38 min    Activity Tolerance Patient tolerated treatment well;No increased pain    Behavior During Therapy WFL for tasks assessed/performed           Past Medical History:  Diagnosis Date   Arthritis    Back pain    Collagen vascular disease ([place])    Depression    Hyperlipemia    Hypertension    Melanoma ([place])    Recurrent UTI     Past Surgical History:  Procedure Laterality Date   ABDOMINAL HYSTERECTOMY     COLONOSCOPY WITH PROPOFOL N/A 05/26/2020   Procedure: COLONOSCOPY WITH PROPOFOL;  Surgeon: Toledo, Benay Pike, MD;  Location: [facility] ENDOSCOPY;  Service: Gastroenterology;  Laterality: N/A;   INCONTINENCE SURGERY     OOPHORECTOMY      There were no vitals filed for this visit.   Subjective Assessment - 09/09/20 1731    Subjective Patient reports she has been walking everyday this week which has been going well 64mins at a time. No excessive pain, does use grandchild stroller. Compliance with HEP with decreased PT frequecy.    Pertinent History Pt is a 64 y.o. retired female referred for chronic LBP.  Pt is a year post L1 compression fracture d/t a fall from a stool onto her back (May 2020) which was the initial onset of her back pain.  Pt received PT and her condition significantly improved; since leaving PT, pt reports her LBP has been worsening and she has had little motivation to do her exercises at home.  Pt describes pain as a tiring, fatigue-like pain  (Worst: 5/10, Best/Current: 0/10); reporting that it feels as if her frame is collapsing when she walks.  Aggravating factors include walking, standing, carrying her grandkids (3 of her 4 grandkids are <4 y.o.), floor transfers, and stairs.  Easing factors include sitting, laying down, and pain medications (advil) as needed on days that she plans to move more.  Pt has a hx of LBP and stenosis noted on imaging taken on 06/04/2019.  Pt reports sx are localized in her low back with no radiating sx and no reports of N/T; hx of sciatica noted after a fall in her 59s but has since resolved.  Pt reports a high fear of falling and avoidance of many activities d/t condition.  Prior to injury, pt was a marathon walker and walked 5 miles a day and did yoga 2-3x a week.  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  Pt denies N/V, B&B changes, unexplained weight fluctuation, saddle paresthesia, fever, night sweats, or unrelenting night pain at this time.    Limitations Standing;Walking;House hold activities    How long can you sit comfortably? Unlimited    How long can you stand comfortably? 5-10 minutes    How long can you walk comfortably? 5  minutes    Patient Stated Goals ability to walk 30 minutes, strengthening my core             Ther-Ex - Nustep L3 UE setting 8; LE 7 42mins for gentle rotation and low grade strengthening/endurance - Step up <> eccentric lower with 6in step x12;  2x 15 with min cuing for eccentric control with good carry over - Monster walks with YTB 4x 88ft with demo needed for memory, and eccentric cotrol with good carry over - Reverse walking YTB 4x 29ft with cuing for stepping "foot past other" for increased hip ext and upright posture with decent carry over - Mini squat 3x 15 with min cuing for technique with good carry over -SKTC with CL LE ext 2x 30sec bilat -Seated lumbar ext with  towel roll x20 2-3sec hold         PT Education - 09/09/20 1738    Education Details therex form/technique    Person(s) Educated Patient    Methods Explanation;Demonstration;Verbal cues    Comprehension Verbalized understanding;Returned demonstration;Verbal cues required            PT Short Term Goals - 09/02/20 1706      PT SHORT TERM GOAL #1   Title Pt will be independent with HEP in order to improve strength and decrease back pain in order to improve pain-free function at home and work.    Baseline 07/08/20 Educated on HEP and handout given; 08/14/20 understands HEP, completing 50%; 09/02/20 understads, completing 75% of the time    Time 4    Period Weeks    Status On-going    Target Date 08/05/20             PT Long Term Goals - 09/02/20 1707      PT LONG TERM GOAL #1   Title Pt will increase FOTO score to 57 to indicate improved funcitonal mobility with ADLs.    Baseline 07/08/20 FOTO 47; 08/14/20 50; 09/02/20 60    Time 8    Period Weeks    Status Achieved      PT LONG TERM GOAL #2   Title Pt will decrease worst back pain as reported on NPRS by at least 2 points in order to demonstrate clinically significant reduction in back pain.    Baseline 07/08/20 Worst 5/10; 08/14/20 5/10 less frequently; 09/02/20 0/10    Time 8    Period Weeks    Status Achieved      PT LONG TERM GOAL #3   Title Pt will be able to ambulate for 30 minutes with pain <3/10 for improved ambulation endurance with less pain.    Baseline 07/08/20 Amb brings on worst pain 5/10, pain onsets at 5 minutes; 08/14/20 Walked 20mins without pain, had to take a short break; 09/02/20 able to walk 48mins with breaks without pain    Time 8    Period Weeks    Status On-going      PT LONG TERM GOAL #4   Title Pt will decrease 5xSTS to 12 seconds or less to demonstrate signficantly improved LE strength similar to age matched norms.    Baseline 07/08/20 5xSTS 16 sec; 08/14/20 10.42sec    Time 8    Period Weeks     Status Achieved      PT LONG TERM GOAL #5   Title Pt will increase gait speed to at least 1.23m/s for full community ambulation.    Baseline 07/08/20 23m/s; 08/14/20 1.20m/s  Time 8    Period Weeks    Status Achieved                 Plan - 09/09/20 1748    Clinical Impression Statement PT continued progression for BLE endurance and upright tolerance with success. Pt able to comply with all cuing for proper technique of therex with good motivation throughout session, no increased pain. PT will assess d/c readiness next session    Personal Factors and Comorbidities Age;Comorbidity 1    Comorbidities HTN    Examination-Activity Limitations Stand;Transfers;Patent attorney for Others;Squat;Stairs    Examination-Participation Restrictions Community Activity;Interpersonal Relationship;Shop;Cleaning    Stability/Clinical Decision Making Evolving/Moderate complexity    Clinical Decision Making Moderate    Rehab Potential Good    PT Frequency 2x / week    PT Duration 6 weeks    PT Treatment/Interventions ADLs/Self Care Home Management;Cryotherapy;Electrical Stimulation;Moist Heat;Traction;Gait training;Stair training;Functional mobility training;Therapeutic activities;Therapeutic exercise;Patient/family education;Cognitive remediation;Neuromuscular re-education;Balance training;Manual techniques    PT Next Visit Plan d/c    PT Home Exercise Plan glute bridge, hip flexor stretch, standing hip ext, mini squat, SLS    Consulted and Agree with Plan of Care Patient           Patient will benefit from skilled therapeutic intervention in order to improve the following deficits and impairments:  Abnormal gait, Improper body mechanics, Pain, Decreased coordination, Decreased mobility, Postural dysfunction, Decreased activity tolerance, Decreased endurance, Decreased range of motion, Decreased strength, Hypomobility, Impaired perceived functional ability, Difficulty walking, Impaired  flexibility  Visit Diagnosis: Chronic midline low back pain without sciatica  Abnormal posture  Abnormality of gait and mobility     Problem List Patient Active Problem List   Diagnosis Date Noted   Dyspnea on exertion 04/19/2020   Aortic atherosclerosis ([place]) 04/19/2020   Essential hypertension 04/19/2020   Hyperlipidemia 04/19/2020   Right carpal tunnel syndrome 11/09/2016   Durwin Reges DPT Durwin Reges 09/09/2020, 6:07 PM  [place] [facility] [address] [place], [address] Phone: [phone]   Fax:  [phone]  Name: Amyria Komar MRN: [id number] Date of Birth: 1956-10-07

## 2020-09-11 ENCOUNTER — Encounter: Payer: PPO | Admitting: Physical Therapy

## 2020-09-17 ENCOUNTER — Ambulatory Visit: Payer: PPO | Admitting: Physical Therapy

## 2020-12-29 DIAGNOSIS — E785 Hyperlipidemia, unspecified: Secondary | ICD-10-CM | POA: Diagnosis not present

## 2020-12-29 DIAGNOSIS — I10 Essential (primary) hypertension: Secondary | ICD-10-CM | POA: Diagnosis not present

## 2021-01-09 ENCOUNTER — Other Ambulatory Visit: Payer: Self-pay | Admitting: Family Medicine

## 2021-01-09 DIAGNOSIS — Z1231 Encounter for screening mammogram for malignant neoplasm of breast: Secondary | ICD-10-CM

## 2021-01-12 DIAGNOSIS — H524 Presbyopia: Secondary | ICD-10-CM | POA: Diagnosis not present

## 2021-01-12 DIAGNOSIS — H0288A Meibomian gland dysfunction right eye, upper and lower eyelids: Secondary | ICD-10-CM | POA: Diagnosis not present

## 2021-01-12 DIAGNOSIS — H5203 Hypermetropia, bilateral: Secondary | ICD-10-CM | POA: Diagnosis not present

## 2021-01-12 DIAGNOSIS — H52213 Irregular astigmatism, bilateral: Secondary | ICD-10-CM | POA: Diagnosis not present

## 2021-01-12 DIAGNOSIS — H0288B Meibomian gland dysfunction left eye, upper and lower eyelids: Secondary | ICD-10-CM | POA: Diagnosis not present

## 2021-01-14 DIAGNOSIS — E785 Hyperlipidemia, unspecified: Secondary | ICD-10-CM | POA: Diagnosis not present

## 2021-01-14 DIAGNOSIS — I10 Essential (primary) hypertension: Secondary | ICD-10-CM | POA: Diagnosis not present

## 2021-01-14 DIAGNOSIS — Z0000 Encounter for general adult medical examination without abnormal findings: Secondary | ICD-10-CM | POA: Diagnosis not present

## 2021-01-14 DIAGNOSIS — M545 Low back pain, unspecified: Secondary | ICD-10-CM | POA: Diagnosis not present

## 2021-01-14 DIAGNOSIS — G8929 Other chronic pain: Secondary | ICD-10-CM | POA: Diagnosis not present

## 2021-01-19 ENCOUNTER — Encounter: Payer: Self-pay | Admitting: Physical Therapy

## 2021-01-19 ENCOUNTER — Ambulatory Visit: Payer: PPO | Attending: Family Medicine | Admitting: Physical Therapy

## 2021-01-19 ENCOUNTER — Other Ambulatory Visit: Payer: Self-pay

## 2021-01-19 DIAGNOSIS — M545 Low back pain, unspecified: Secondary | ICD-10-CM | POA: Insufficient documentation

## 2021-01-19 DIAGNOSIS — R2689 Other abnormalities of gait and mobility: Secondary | ICD-10-CM | POA: Insufficient documentation

## 2021-01-19 DIAGNOSIS — Z7409 Other reduced mobility: Secondary | ICD-10-CM | POA: Insufficient documentation

## 2021-01-19 DIAGNOSIS — G8929 Other chronic pain: Secondary | ICD-10-CM | POA: Diagnosis not present

## 2021-01-19 NOTE — Therapy (Signed)
[place] [facility] [address] [place], [address] Phone: [phone]   Fax:  [phone]  Physical Therapy Evaluation  Patient Details  Name: Michelle Cisneros MRN: [id number] Date of Birth: 20-May-1956 No data recorded  Encounter Date: 01/19/2021   PT End of Session - 01/19/21 1601    Visit Number 1    Number of Visits 17    Date for PT Re-Evaluation 03/20/21    Authorization - Visit Number 1    Authorization - Number of Visits 17    PT Start Time 9735    PT Stop Time 1555    PT Time Calculation (min) 40 min    Activity Tolerance Patient tolerated treatment well;Patient limited by fatigue    Behavior During Therapy [facility] for tasks assessed/performed           Past Medical History:  Diagnosis Date  . Arthritis   . Back pain   . Collagen vascular disease ([place])   . Depression   . Hyperlipemia   . Hypertension   . Melanoma ([place])   . Recurrent UTI     Past Surgical History:  Procedure Laterality Date  . ABDOMINAL HYSTERECTOMY    . COLONOSCOPY WITH PROPOFOL N/A 05/26/2020   Procedure: COLONOSCOPY WITH PROPOFOL;  Surgeon: Toledo, Benay Pike, MD;  Location: [facility] ENDOSCOPY;  Service: Gastroenterology;  Laterality: N/A;  . INCONTINENCE SURGERY    . OOPHORECTOMY      There were no vitals filed for this visit.    Subjective Assessment - 01/19/21 1513    Pertinent History pt is 65 yo female with c/c LBP bilaterally and decreased endurance with previous hx PT for treating a previous compression fracture and lateral stenosis. Pain gets worse with standing erect for about 10 minutes and also with walking 10 min unless using an assistive device. Currently 0/10 with worst pain 7/10. Pain will ease when out of position or when stopping walking. In the morning feels more stiff and gets better through the day after moving around. Pt would like to get back to prolonged walking without any use of support or increases in  pain. Pt denies any N/T, B/B, unexplained weight loss, or unrelenting night pains.    Limitations Standing;Walking;House hold activities    How long can you sit comfortably? Unlimited    How long can you stand comfortably? 10    How long can you walk comfortably? 10    Diagnostic tests None    Patient Stated Goals Prolonged walking without pain and having to use a device    Currently in Pain? No/denies    Pain Location Back    Pain Orientation Lower    Pain Descriptors / Indicators Discomfort;Tiring    Pain Type Chronic pain    Pain Onset More than a month ago    Pain Frequency Intermittent    Aggravating Factors  Standing and walking over 10 minutes    Pain Relieving Factors sitting, laying down, being out of erect position    Effect of Pain on Daily Activities Decreased endurance, pain with some movements            Eval Posture/Gait - Increased thoracic kyphosis and decreased lumbar lordosis and forward trunk lean with slight forward head and rounded shoulders during ambulation.  Palpation/Sensation - WNL FUNCTIONAL TEST -   5TSTS 12.53; 6MWT 3 min 8 sec before needing to rest 351ft REPEATED MOTIONS - No changes, no concerns of centralization/peripheralizations  AROM/OP LBP  FLEX-  WNL EXT- nearly 100% limited with pain SIDEBENDING- WNL ROTATION - WNL  HIP FLEX - WNL EXT - limited bilat <10d  IR - WNL ER - WNL  PROM/OP LBP FLEX-  WNL EXT-  limited with pain SIDEBENDING-  WNL ROTATION - WNL  HIP FLEX - WNL EXT - 25% limited IR - WNL ER - WNL  PAIVM/PPIVM/PAM - Hypomobile no pain; no concerns  MUSCLE LENGTH TEST Pat Patrick  - WNL Thomas - WNL   STRENGTH Hip flex  L/R 4+ Hip ext L/R 4- Hip abd  L/R   4 Hip add  L/R   4 Hip ER  L/R 5 Hip IR  L/R  5  Knee flex L/R   5 ext   L/R 5   SPECIAL TESTS SLR - Negative FADIR - Negative FABER - Negative LQS - Negative   Therex     PT reviewed the following HEP with patient with patient able to demonstrate a  set of the following with min cuing for correction needed. PT educated patient on parameters of therex; how/when to inc/decrease intensity, frequency, rep/set range, and purpose of therex with verbalized understanding from patient   STS 3 x10  1x day  2-3 days/wk  Standing abd/add 3x10  3 x10  1x day  2-3 days/wk  Prone press up 3x10  3 x10  1x day  2-3 days/wk               Objective measurements completed on examination: See above findings.               PT Education - 01/19/21 1557    Education Details Pt was educated on HEP, Diagnosis/Prognosis    Person(s) Educated Patient    Methods Explanation;Demonstration;Verbal cues    Comprehension Verbalized understanding;Returned demonstration;Verbal cues required            PT Short Term Goals - 01/19/21 1622      PT SHORT TERM GOAL #1   Title Pt will be independent with HEP in order to improve strength and posture to decrease LBP to partake in pain free function at home and community.    Baseline 01/19/21 HEP given    Time 4    Period Weeks    Status New             PT Long Term Goals - 01/19/21 1626      PT LONG TERM GOAL #1   Title Pt will decrease 5TSTS to at least 11sec to demonstrate improved LE strength for age predicted norms    Baseline 01/19/21 12.53sec    Time 8    Period Weeks    Status New      PT LONG TERM GOAL #2   Title Pt will be able to walk the full 6MWT to demonstrate significant  improvment in cardiovascular endurance to demonstrate safe community ambulation    Baseline 01/19/21 3 min 8 sec    Time 8    Period Weeks      PT LONG TERM GOAL #3   Title Patient will increase FOTO score to 57 to demonstrate predicted increase in functional mobility to complete ADLs    Baseline 01/19/21 45    Time 8    Period Weeks    Status New                  Plan - 01/19/21 1609    Clinical Impression Statement Pt is a 65 y.o female with  c/c of bilateral LBP and decreased  endurance with a HX of Spinal compression fracture and stenosis last year. Evaluation reveals deficits in hip and core strength, decreased endurance with walking, LBP, decreased balance, decreased motor control/coordination, along with postural deficits. Activity limitations include prolonged community walking unless using an assistive device, squatting, lifting and standing for periods of time over 10 minutes resulting in decreased function of ADLs. Pt will benefit from skilled PT to address these impairments.    Personal Factors and Comorbidities Age;Comorbidity 1;Comorbidity 2;Past/Current Experience;Time since onset of injury/illness/exacerbation;Fitness    Comorbidities HTN, lumbar stenosis    Examination-Activity Limitations Stand;Patent attorney for Others;Lift;Squat;Stairs;Transfers    Examination-Participation Restrictions Community Activity;Interpersonal Relationship;Shop;Cleaning;Laundry;Yard Work    Merchant navy officer Evolving/Moderate complexity    Clinical Decision Making Moderate    Rehab Potential Good    PT Frequency 2x / week    PT Duration 8 weeks    PT Treatment/Interventions ADLs/Self Care Home Management;Cryotherapy;Electrical Stimulation;Moist Heat;Traction;Gait training;Stair training;Functional mobility training;Therapeutic activities;Therapeutic exercise;Patient/family education;Cognitive remediation;Neuromuscular re-education;Balance training;Manual techniques;Joint Manipulations;Spinal Manipulations;Passive range of motion;Dry needling;Fluidtherapy;Contrast Bath;DME Instruction;Ultrasound;Iontophoresis 4mg /ml Dexamethasone    PT Next Visit Plan HEP review, Therex progression    PT Home Exercise Plan STS, standing abd/add, supine press up    Consulted and Agree with Plan of Care Patient           Patient will benefit from skilled therapeutic intervention in order to improve the following deficits and impairments:  Abnormal gait,Decreased activity  tolerance,Cardiopulmonary status limiting activity,Decreased balance,Decreased endurance,Decreased mobility,Decreased range of motion,Decreased strength,Difficulty walking,Hypomobility,Postural dysfunction,Improper body mechanics,Decreased coordination,Impaired flexibility,Pain  Visit Diagnosis: Chronic bilateral low back pain without sciatica  Decreased mobility and endurance     Problem List Patient Active Problem List   Diagnosis Date Noted  . Dyspnea on exertion 04/19/2020  . Aortic atherosclerosis ([place]) 04/19/2020  . Essential hypertension 04/19/2020  . Hyperlipidemia 04/19/2020  . Right carpal tunnel syndrome 11/09/2016   Durwin Reges DPT Turner Daniels, SPT  Durwin Reges 01/20/2021, 10:09 AM  [place] [facility] [address], [place], [address] Phone: [phone]   Fax:  [phone]  Name: Michelle Cisneros MRN: [id number] Date of Birth: [birth year]

## 2021-01-20 ENCOUNTER — Encounter: Payer: Self-pay | Admitting: Physical Therapy

## 2021-01-21 DIAGNOSIS — J029 Acute pharyngitis, unspecified: Secondary | ICD-10-CM | POA: Diagnosis not present

## 2021-01-21 DIAGNOSIS — R059 Cough, unspecified: Secondary | ICD-10-CM | POA: Diagnosis not present

## 2021-01-22 ENCOUNTER — Ambulatory Visit: Payer: PPO | Admitting: Physical Therapy

## 2021-01-26 ENCOUNTER — Encounter: Payer: PPO | Admitting: Physical Therapy

## 2021-01-28 ENCOUNTER — Encounter: Payer: Self-pay | Admitting: Physical Therapy

## 2021-01-28 ENCOUNTER — Other Ambulatory Visit: Payer: Self-pay

## 2021-01-28 ENCOUNTER — Ambulatory Visit: Payer: PPO | Attending: Family Medicine | Admitting: Physical Therapy

## 2021-01-28 DIAGNOSIS — R293 Abnormal posture: Secondary | ICD-10-CM | POA: Diagnosis not present

## 2021-01-28 DIAGNOSIS — Z7409 Other reduced mobility: Secondary | ICD-10-CM | POA: Insufficient documentation

## 2021-01-28 DIAGNOSIS — M5386 Other specified dorsopathies, lumbar region: Secondary | ICD-10-CM | POA: Diagnosis not present

## 2021-01-28 DIAGNOSIS — R269 Unspecified abnormalities of gait and mobility: Secondary | ICD-10-CM | POA: Diagnosis not present

## 2021-01-28 DIAGNOSIS — M545 Low back pain, unspecified: Secondary | ICD-10-CM | POA: Diagnosis not present

## 2021-01-28 DIAGNOSIS — G8929 Other chronic pain: Secondary | ICD-10-CM

## 2021-01-28 NOTE — Therapy (Signed)
[place] [facility] [address] [place], [address] Phone: [phone]   Fax:  [phone]  Physical Therapy Treatment  Patient Details  Name: Michelle Cisneros MRN: [id number] Date of Birth: 03/16/1956 No data recorded  Encounter Date: 01/28/2021   PT End of Session - 01/28/21 0953    Visit Number 2    Number of Visits 17    Date for PT Re-Evaluation 03/20/21    Authorization - Visit Number 2    Authorization - Number of Visits 17    PT Start Time 0945    PT Stop Time 1023    PT Time Calculation (min) 38 min    Activity Tolerance Patient tolerated treatment well;Patient limited by fatigue    Behavior During Therapy [facility] for tasks assessed/performed           Past Medical History:  Diagnosis Date  . Arthritis   . Back pain   . Collagen vascular disease ([place])   . Depression   . Hyperlipemia   . Hypertension   . Melanoma ([place])   . Recurrent UTI     Past Surgical History:  Procedure Laterality Date  . ABDOMINAL HYSTERECTOMY    . COLONOSCOPY WITH PROPOFOL N/A 05/26/2020   Procedure: COLONOSCOPY WITH PROPOFOL;  Surgeon: Toledo, Benay Pike, MD;  Location: [facility] ENDOSCOPY;  Service: Gastroenterology;  Laterality: N/A;  . INCONTINENCE SURGERY    . OOPHORECTOMY      There were no vitals filed for this visit.   Subjective Assessment - 01/28/21 0947    Subjective Pt reports no back pain currently. Pt reports doing some walking since last visit and has some questions about HEP.    Pertinent History pt is 65 yo female with c/c LBP bilaterally and decreased endurance with previous hx PT for treating a previous compression fracture and lateral stenosis. Pain gets worse with standing erect for about 10 minutes and also with walking 10 min unless using an assistive device. Currently 0/10 with worst pain 7/10. Pain will ease when out of position or when stopping walking. In the morning feels more stiff and gets better  through the day after moving around. Pt would like to get back to prolonged walking without any use of support or increases in pain. Pt denies any N/T, B/B, unexplained weight loss, or unrelenting night pains.    Limitations Standing;Walking;House hold activities    How long can you sit comfortably? Unlimited    How long can you stand comfortably? 10    How long can you walk comfortably? 10    Diagnostic tests None    Patient Stated Goals Prolonged walking without pain and having to use a device    Pain Onset More than a month ago           Ther-Ex  Nustep L4 UE setting 9; LE 8 34mins for gentle rotation and low grade strengthening/endurance   STS from standard chair  4# DB 3x10; cuing for slow eccentric with fast concentric  Sidestepping with BTB 3x10; min cuing not drag feet  Monster walks 4x 57ft;   Hip Ext bilat on omega x10 25#; 2x10 40#  SL Bilateral step down on stairs x 6 with heavy HHA; switch to green step up 3x10                           PT Education - 01/28/21 0950    Education Details therex  form/technique, HEP review    Person(s) Educated Patient    Methods Explanation;Demonstration;Verbal cues    Comprehension Verbalized understanding;Returned demonstration;Verbal cues required            PT Short Term Goals - 01/19/21 1622      PT SHORT TERM GOAL #1   Title Pt will be independent with HEP in order to improve strength and posture to decrease LBP to partake in pain free function at home and community.    Baseline 01/19/21 HEP given    Time 4    Period Weeks    Status New             PT Long Term Goals - 01/19/21 1626      PT LONG TERM GOAL #1   Title Pt will decrease 5TSTS to at least 11sec to demonstrate improved LE strength for age predicted norms    Baseline 01/19/21 12.53sec    Time 8    Period Weeks    Status New      PT LONG TERM GOAL #2   Title Pt will be able to walk the full 6MWT to demonstrate significant   improvment in cardiovascular endurance to demonstrate safe community ambulation    Baseline 01/19/21 3 min 8 sec    Time 8    Period Weeks      PT LONG TERM GOAL #3   Title Patient will increase FOTO score to 57 to demonstrate predicted increase in functional mobility to complete ADLs    Baseline 01/19/21 45    Time 8    Period Weeks    Status New                 Plan - 01/28/21 0951    Clinical Impression Statement PT initiated therex progression for increase BLE strength and endurance for carry over into community ambulation/activity with success. Pt was able to perform all therex with proper technique following demonstration and min cuing. Pt had not increases in pain and good motivation throughout session. PT will continue to progress as able.    Personal Factors and Comorbidities Age;Comorbidity 1;Comorbidity 2;Past/Current Experience;Time since onset of injury/illness/exacerbation;Fitness    Comorbidities HTN, lumbar stenosis    Examination-Activity Limitations Stand;Patent attorney for Others;Lift;Squat;Stairs;Transfers    Examination-Participation Restrictions Community Activity;Interpersonal Relationship;Shop;Cleaning;Laundry;Yard Work    Merchant navy officer Evolving/Moderate complexity    Clinical Decision Making Moderate    Rehab Potential Good    PT Frequency 2x / week    PT Duration 8 weeks    PT Treatment/Interventions ADLs/Self Care Home Management;Cryotherapy;Electrical Stimulation;Moist Heat;Traction;Gait training;Stair training;Functional mobility training;Therapeutic activities;Therapeutic exercise;Patient/family education;Cognitive remediation;Neuromuscular re-education;Balance training;Manual techniques;Joint Manipulations;Spinal Manipulations;Passive range of motion;Dry needling;Fluidtherapy;Contrast Bath;DME Instruction;Ultrasound;Iontophoresis 4mg /ml Dexamethasone    PT Next Visit Plan HEP review, Therex progression, FGA    PT Home  Exercise Plan STS, standing abd/add, supine press up    Consulted and Agree with Plan of Care Patient           Patient will benefit from skilled therapeutic intervention in order to improve the following deficits and impairments:  Abnormal gait,Decreased activity tolerance,Cardiopulmonary status limiting activity,Decreased balance,Decreased endurance,Decreased mobility,Decreased range of motion,Decreased strength,Difficulty walking,Hypomobility,Postural dysfunction,Improper body mechanics,Decreased coordination,Impaired flexibility,Pain  Visit Diagnosis: Chronic bilateral low back pain without sciatica     Problem List Patient Active Problem List   Diagnosis Date Noted  . Dyspnea on exertion 04/19/2020  . Aortic atherosclerosis ([place]) 04/19/2020  . Essential hypertension 04/19/2020  . Hyperlipidemia 04/19/2020  . Right carpal tunnel syndrome  11/09/2016     Chelsea Peterson DPT Turner Daniels, SPT  Turner Daniels 01/28/2021, 10:34 AM  [place] [facility] [address], [place], [address] Phone: [phone]   Fax:  [phone]  Name: Radie Berges MRN: [id number] Date of Birth: September 08, 1956

## 2021-02-05 ENCOUNTER — Encounter: Payer: Self-pay | Admitting: Physical Therapy

## 2021-02-05 ENCOUNTER — Ambulatory Visit: Payer: PPO | Admitting: Physical Therapy

## 2021-02-05 ENCOUNTER — Other Ambulatory Visit: Payer: Self-pay

## 2021-02-05 DIAGNOSIS — M545 Low back pain, unspecified: Secondary | ICD-10-CM

## 2021-02-05 DIAGNOSIS — Z7409 Other reduced mobility: Secondary | ICD-10-CM

## 2021-02-05 DIAGNOSIS — G8929 Other chronic pain: Secondary | ICD-10-CM

## 2021-02-05 NOTE — Therapy (Signed)
[facility] [address] [place], [address] Phone: [phone]   Fax:  [phone]  Physical Therapy Treatment  Patient Details  Name: Michelle Cisneros MRN: [id number] Date of Birth: [birth year] No data recorded  Encounter Date: 02/05/2021   PT End of Session - 02/05/21 1036    Visit Number 3    Number of Visits 17    Date for PT Re-Evaluation 03/20/21    Authorization - Visit Number 3    Authorization - Number of Visits 17    PT Start Time 7342    PT Stop Time 1110    PT Time Calculation (min) 40 min    Activity Tolerance Patient tolerated treatment well;Patient limited by fatigue    Behavior During Therapy [facility] for tasks assessed/performed           Past Medical History:  Diagnosis Date  . Arthritis   . Back pain   . Collagen vascular disease ([place])   . Depression   . Hyperlipemia   . Hypertension   . Melanoma ([place])   . Recurrent UTI     Past Surgical History:  Procedure Laterality Date  . ABDOMINAL HYSTERECTOMY    . COLONOSCOPY WITH PROPOFOL N/A 05/26/2020   Procedure: COLONOSCOPY WITH PROPOFOL;  Surgeon: Toledo, Benay Pike, MD;  Location: [facility] ENDOSCOPY;  Service: Gastroenterology;  Laterality: N/A;  . INCONTINENCE SURGERY    . OOPHORECTOMY      There were no vitals filed for this visit.   Subjective Assessment - 02/05/21 1033    Subjective Pt reports no pain today. Has been working on HEP, but has not had much time to walk d/t family being in town.    Pertinent History pt is 65 yo female with c/c LBP bilaterally and decreased endurance with previous hx PT for treating a previous compression fracture and lateral stenosis. Pain gets worse with standing erect for about 10 minutes and also with walking 10 min unless using an assistive device. Currently 0/10 with worst pain 7/10. Pain will ease when out of position or when stopping walking. In the morning feels more stiff and gets better through  the day after moving around. Pt would like to get back to prolonged walking without any use of support or increases in pain. Pt denies any N/T, B/B, unexplained weight loss, or unrelenting night pains.    Limitations Standing;Walking;House hold activities    How long can you sit comfortably? Unlimited    How long can you stand comfortably? 10    How long can you walk comfortably? 10    Diagnostic tests None    Patient Stated Goals Prolonged walking without pain and having to use a device    Pain Onset More than a month ago              Ther-Ex  Nustep L4 UE setting 9; LE 8 63mins for gentle rotation and low grade strengthening/endurance  Lateral step down from 3in step with demo and heavy cuing needed for proper technique with good carry over  Mini squat with 4# DB 3x 10 with mat table for TC with   Monster walks 4x 33ft bluTB with heavy cuing throughout for eccentric control   Hip Ext bilat on omega 3x10 40# with heavy cuing for eccentric control   Backward walking 0.64mph 80min for hip ext encouragement, with heavy cuing for reciprocal stepping with some carry over  PT Education - 02/05/21 1035    Education Details therex form/technique    Person(s) Educated Patient    Methods Explanation;Demonstration    Comprehension Verbalized understanding;Returned demonstration;Verbal cues required            PT Short Term Goals - 01/19/21 1622      PT SHORT TERM GOAL #1   Title Pt will be independent with HEP in order to improve strength and posture to decrease LBP to partake in pain free function at home and community.    Baseline 01/19/21 HEP given    Time 4    Period Weeks    Status New             PT Long Term Goals - 01/19/21 1626      PT LONG TERM GOAL #1   Title Pt will decrease 5TSTS to at least 11sec to demonstrate improved LE strength for age predicted norms    Baseline 01/19/21 12.53sec    Time 8    Period Weeks    Status New       PT LONG TERM GOAL #2   Title Pt will be able to walk the full 6MWT to demonstrate significant  improvment in cardiovascular endurance to demonstrate safe community ambulation    Baseline 01/19/21 3 min 8 sec    Time 8    Period Weeks      PT LONG TERM GOAL #3   Title Patient will increase FOTO score to 57 to demonstrate predicted increase in functional mobility to complete ADLs    Baseline 01/19/21 45    Time 8    Period Weeks    Status New                 Plan - 02/05/21 1138    Clinical Impression Statement PT continued therex progression for increased bilat hip strength and ext mobility, and functional movements with success. Patient is able to comply with all cuing for proper technique of therex with good motivation throughout session. PT will continue progression as able.    Personal Factors and Comorbidities Age;Comorbidity 1;Comorbidity 2;Past/Current Experience;Time since onset of injury/illness/exacerbation;Fitness    Comorbidities HTN, lumbar stenosis    Examination-Activity Limitations Stand;Patent attorney for Others;Lift;Squat;Stairs;Transfers    Examination-Participation Restrictions Community Activity;Interpersonal Relationship;Shop;Cleaning;Laundry;Yard Work    Merchant navy officer Evolving/Moderate complexity    Clinical Decision Making Moderate    Rehab Potential Good    PT Frequency 2x / week    PT Duration 8 weeks    PT Treatment/Interventions ADLs/Self Care Home Management;Cryotherapy;Electrical Stimulation;Moist Heat;Traction;Gait training;Stair training;Functional mobility training;Therapeutic activities;Therapeutic exercise;Patient/family education;Cognitive remediation;Neuromuscular re-education;Balance training;Manual techniques;Joint Manipulations;Spinal Manipulations;Passive range of motion;Dry needling;Fluidtherapy;Contrast Bath;DME Instruction;Ultrasound;Iontophoresis 4mg /ml Dexamethasone    PT Next Visit Plan HEP review, Therex  progression, FGA    PT Home Exercise Plan STS, standing abd/add, supine press up    Consulted and Agree with Plan of Care Patient           Patient will benefit from skilled therapeutic intervention in order to improve the following deficits and impairments:  Abnormal gait,Decreased activity tolerance,Cardiopulmonary status limiting activity,Decreased balance,Decreased endurance,Decreased mobility,Decreased range of motion,Decreased strength,Difficulty walking,Hypomobility,Postural dysfunction,Improper body mechanics,Decreased coordination,Impaired flexibility,Pain  Visit Diagnosis: Chronic bilateral low back pain without sciatica  Decreased mobility and endurance  Chronic midline low back pain without sciatica     Problem List Patient Active Problem List   Diagnosis Date Noted  . Dyspnea on exertion 04/19/2020  . Aortic atherosclerosis ([place]) 04/19/2020  . Essential hypertension  04/19/2020  . Hyperlipidemia 04/19/2020  . Right carpal tunnel syndrome 11/09/2016   Durwin Reges DPT Durwin Reges 02/05/2021, 11:41 AM  [facility] [address] [place], [address] Phone: [phone]   Fax:  [phone]  Name: Michelle Cisneros MRN: [id number] Date of Birth: 12-17-1956

## 2021-02-11 ENCOUNTER — Other Ambulatory Visit: Payer: Self-pay

## 2021-02-11 ENCOUNTER — Encounter: Payer: Self-pay | Admitting: Physical Therapy

## 2021-02-11 ENCOUNTER — Ambulatory Visit: Payer: PPO

## 2021-02-11 DIAGNOSIS — M545 Low back pain, unspecified: Secondary | ICD-10-CM | POA: Diagnosis not present

## 2021-02-11 DIAGNOSIS — R293 Abnormal posture: Secondary | ICD-10-CM

## 2021-02-11 DIAGNOSIS — M5386 Other specified dorsopathies, lumbar region: Secondary | ICD-10-CM

## 2021-02-11 DIAGNOSIS — Z7409 Other reduced mobility: Secondary | ICD-10-CM

## 2021-02-11 DIAGNOSIS — R269 Unspecified abnormalities of gait and mobility: Secondary | ICD-10-CM

## 2021-02-11 DIAGNOSIS — G8929 Other chronic pain: Secondary | ICD-10-CM

## 2021-02-11 NOTE — Therapy (Signed)
[facility] [address], [place], [address] Phone: [phone]   Fax:  [phone]  Physical Therapy Treatment  Patient Details  Name: Michelle Cisneros MRN: [id number] Date of Birth: 09-28-1956 No data recorded  Encounter Date: 02/11/2021   PT End of Session - 02/11/21 0852    Visit Number 4    Number of Visits 17    Date for PT Re-Evaluation 03/20/21    Authorization - Visit Number 4    Authorization - Number of Visits 17    PT Start Time 0900    PT Stop Time 0943    PT Time Calculation (min) 43 min    Activity Tolerance Patient tolerated treatment well;Patient limited by fatigue    Behavior During Therapy [facility] for tasks assessed/performed           Past Medical History:  Diagnosis Date  . Arthritis   . Back pain   . Collagen vascular disease ([place])   . Depression   . Hyperlipemia   . Hypertension   . Melanoma ([place])   . Recurrent UTI     Past Surgical History:  Procedure Laterality Date  . ABDOMINAL HYSTERECTOMY    . COLONOSCOPY WITH PROPOFOL N/A 05/26/2020   Procedure: COLONOSCOPY WITH PROPOFOL;  Surgeon: Toledo, Benay Pike, MD;  Location: [facility] ENDOSCOPY;  Service: Gastroenterology;  Laterality: N/A;  . INCONTINENCE SURGERY    . OOPHORECTOMY      There were no vitals filed for this visit.   Subjective Assessment - 02/11/21 0902    Subjective Patient reported that she is doing well today, busy day today with her grandchildren. Stated she has not been compliant with HEP due to her son visiting, reported it was kind of hectic.    Pertinent History pt is 65 yo female with c/c LBP bilaterally and decreased endurance with previous hx PT for treating a previous compression fracture and lateral stenosis. Pain gets worse with standing erect for about 10 minutes and also with walking 10 min unless using an assistive device. Currently 0/10 with worst pain 7/10. Pain will ease when out of position or when  stopping walking. In the morning feels more stiff and gets better through the day after moving around. Pt would like to get back to prolonged walking without any use of support or increases in pain. Pt denies any N/T, B/B, unexplained weight loss, or unrelenting night pains.    Limitations Standing;Walking;House hold activities    How long can you sit comfortably? Unlimited    How long can you stand comfortably? 10    How long can you walk comfortably? 10    Diagnostic tests None    Patient Stated Goals Prolonged walking without pain and having to use a device    Currently in Pain? No/denies          Ther-Ex   Nustep L4 UE setting 9; LE 8 89mins for gentle rotation and low grade strengthening/endurance    Lateral step down from 3in step with demo and heavy cuing needed for proper technique with fair carry over 2x10  Mini squat with 5# DB 3x 10 with mat table for TC with    Monster walks 4x 14ft bluTB with heavy cuing throughout for eccentric control    Steps x4 bouts with step to pattern on descent due to L leg pain/weakness  Step downs from 3" step RLE leading 2x10 (and backwards stepping back onto step with RLE leading)  Step downs base +1 2x10 RLE leading (and backwards stepping back onto step with RLE leading)  Step ups (LLE leading) and step overs base +1 x10  Step up backwards LLE unable with base +1, able to complete with just base x10  Hip Ext bilat on omega 3x10 40# with heavy cuing for eccentric control    Backward walking 0.76mph 11min for hip ext encouragement, with heavy cuing for reciprocal stepping with some carry over    pt response/clincical impression:  Pt exhibited compensation movements with eccentric control activities such as step taps and stair navigation. Improved stair nagivation noted after step exercises and trials of attempts, pt seemed to have improved confidence. Able to complete step over step descent with single UE support. Does report L knee  pain/twisting sensation that resolves with rest with exercises this session. The patient would benefit from further skilled PT intervention to continue to progress towards goals.       PT Education - 02/11/21 0851    Education Details therex form/technique    Person(s) Educated Patient    Methods Explanation;Demonstration    Comprehension Verbalized understanding;Returned demonstration;Verbal cues required            PT Short Term Goals - 01/19/21 1622      PT SHORT TERM GOAL #1   Title Pt will be independent with HEP in order to improve strength and posture to decrease LBP to partake in pain free function at home and community.    Baseline 01/19/21 HEP given    Time 4    Period Weeks    Status New             PT Long Term Goals - 01/19/21 1626      PT LONG TERM GOAL #1   Title Pt will decrease 5TSTS to at least 11sec to demonstrate improved LE strength for age predicted norms    Baseline 01/19/21 12.53sec    Time 8    Period Weeks    Status New      PT LONG TERM GOAL #2   Title Pt will be able to walk the full 6MWT to demonstrate significant  improvment in cardiovascular endurance to demonstrate safe community ambulation    Baseline 01/19/21 3 min 8 sec    Time 8    Period Weeks      PT LONG TERM GOAL #3   Title Patient will increase FOTO score to 57 to demonstrate predicted increase in functional mobility to complete ADLs    Baseline 01/19/21 45    Time 8    Period Weeks    Status New                 Plan - 02/11/21 0851    Clinical Impression Statement Pt exhibited compensation movements with eccentric control activities such as step taps and stair navigation. Improved stair nagivation noted after step exercises and trials of attempts, pt seemed to have improved confidence. Able to complete step over step descent with single UE support. Does report L knee pain/twisting sensation that resolves with rest with exercises this session. The patient would benefit  from further skilled PT intervention to continue to progress towards goals.    Personal Factors and Comorbidities Age;Comorbidity 1;Comorbidity 2;Past/Current Experience;Time since onset of injury/illness/exacerbation;Fitness    Comorbidities HTN, lumbar stenosis    Examination-Activity Limitations Stand;Patent attorney for Others;Lift;Squat;Stairs;Transfers    Examination-Participation Restrictions Community Activity;Interpersonal Relationship;Shop;Cleaning;Laundry;Yard Work    Merchant navy officer Evolving/Moderate complexity  Rehab Potential Good    PT Frequency 2x / week    PT Duration 8 weeks    PT Treatment/Interventions ADLs/Self Care Home Management;Cryotherapy;Electrical Stimulation;Moist Heat;Traction;Gait training;Stair training;Functional mobility training;Therapeutic activities;Therapeutic exercise;Patient/family education;Cognitive remediation;Neuromuscular re-education;Balance training;Manual techniques;Joint Manipulations;Spinal Manipulations;Passive range of motion;Dry needling;Fluidtherapy;Contrast Bath;DME Instruction;Ultrasound;Iontophoresis 4mg /ml Dexamethasone    PT Next Visit Plan HEP review, Therex progression, FGA    PT Home Exercise Plan STS, standing abd/add, supine press up    Consulted and Agree with Plan of Care Patient           Patient will benefit from skilled therapeutic intervention in order to improve the following deficits and impairments:  Abnormal gait,Decreased activity tolerance,Cardiopulmonary status limiting activity,Decreased balance,Decreased endurance,Decreased mobility,Decreased range of motion,Decreased strength,Difficulty walking,Hypomobility,Postural dysfunction,Improper body mechanics,Decreased coordination,Impaired flexibility,Pain  Visit Diagnosis: Chronic bilateral low back pain without sciatica  Decreased mobility and endurance  Chronic midline low back pain without sciatica  Abnormality of gait and  mobility  Abnormal posture  Decreased ROM of lumbar spine     Problem List Patient Active Problem List   Diagnosis Date Noted  . Dyspnea on exertion 04/19/2020  . Aortic atherosclerosis ([place]) 04/19/2020  . Essential hypertension 04/19/2020  . Hyperlipidemia 04/19/2020  . Right carpal tunnel syndrome 11/09/2016    Lieutenant Diego PT, DPT 1:14 PM,02/11/21   [facility] [address], [place], [address] Phone: [phone]   Fax:  [phone]  Name: Michelle Cisneros MRN: [id number] Date of Birth: Aug 31, 1956

## 2021-02-17 ENCOUNTER — Other Ambulatory Visit: Payer: Self-pay

## 2021-02-17 ENCOUNTER — Encounter: Payer: PPO | Admitting: Physical Therapy

## 2021-02-17 ENCOUNTER — Ambulatory Visit
Admission: RE | Admit: 2021-02-17 | Discharge: 2021-02-17 | Disposition: A | Discharge status: Home / Self Care | Payer: PPO | Source: Ambulatory Visit | Attending: Family Medicine | Admitting: Family Medicine

## 2021-02-17 DIAGNOSIS — Z1231 Encounter for screening mammogram for malignant neoplasm of breast: Secondary | ICD-10-CM | POA: Diagnosis not present

## 2021-02-19 ENCOUNTER — Other Ambulatory Visit: Payer: Self-pay

## 2021-02-19 ENCOUNTER — Ambulatory Visit: Payer: PPO | Attending: Family Medicine | Admitting: Physical Therapy

## 2021-02-19 ENCOUNTER — Encounter: Payer: Self-pay | Admitting: Physical Therapy

## 2021-02-19 DIAGNOSIS — Z7409 Other reduced mobility: Secondary | ICD-10-CM

## 2021-02-19 DIAGNOSIS — M545 Low back pain, unspecified: Secondary | ICD-10-CM | POA: Diagnosis not present

## 2021-02-19 DIAGNOSIS — G8929 Other chronic pain: Secondary | ICD-10-CM | POA: Diagnosis not present

## 2021-02-19 DIAGNOSIS — M4856XS Collapsed vertebra, not elsewhere classified, lumbar region, sequela of fracture: Secondary | ICD-10-CM | POA: Diagnosis not present

## 2021-02-19 NOTE — Therapy (Signed)
[facility] [address], [place], [address] Phone: [phone]   Fax:  [phone]  Physical Therapy Treatment  Patient Details  Name: Michelle Cisneros MRN: [id number] Date of Birth: 07-12-1956 No data recorded  Encounter Date: 02/19/2021   PT End of Session - 02/19/21 1031    Visit Number 5    Number of Visits 17    Date for PT Re-Evaluation 03/20/21    Authorization - Visit Number 5    Authorization - Number of Visits 17    PT Start Time 8299    PT Stop Time 1105    PT Time Calculation (min) 40 min    Activity Tolerance Patient tolerated treatment well;Patient limited by fatigue    Behavior During Therapy [facility] for tasks assessed/performed           Past Medical History:  Diagnosis Date  . Arthritis   . Back pain   . Collagen vascular disease ([place])   . Depression   . Hyperlipemia   . Hypertension   . Melanoma ([place])   . Recurrent UTI     Past Surgical History:  Procedure Laterality Date  . ABDOMINAL HYSTERECTOMY    . COLONOSCOPY WITH PROPOFOL N/A 05/26/2020   Procedure: COLONOSCOPY WITH PROPOFOL;  Surgeon: Toledo, Benay Pike, MD;  Location: [facility] ENDOSCOPY;  Service: Gastroenterology;  Laterality: N/A;  . INCONTINENCE SURGERY    . OOPHORECTOMY      There were no vitals filed for this visit.   Subjective Assessment - 02/19/21 1027    Subjective Pt reports still having some back pain every now and then, no pain currently. Continued compliance with HEP but reports she has not been able to walk much for endurance.    Pertinent History pt is 65 yo female with c/c LBP bilaterally and decreased endurance with previous hx PT for treating a previous compression fracture and lateral stenosis. Pain gets worse with standing erect for about 10 minutes and also with walking 10 min unless using an assistive device. Currently 0/10 with worst pain 7/10. Pain will ease when out of position or when stopping  walking. In the morning feels more stiff and gets better through the day after moving around. Pt would like to get back to prolonged walking without any use of support or increases in pain. Pt denies any N/T, B/B, unexplained weight loss, or unrelenting night pains.    Limitations Standing;Walking;House hold activities    How long can you sit comfortably? Unlimited    How long can you stand comfortably? 10    How long can you walk comfortably? 10    Diagnostic tests None    Patient Stated Goals Prolonged walking without pain and having to use a device    Pain Onset More than a month ago           Therex   Nustep L4 UE setting 9; LE 8 24mins for gentle rotation and low grade strengthening/endurance   STS from standard chair 3x6 #5 DB; focus on power with concentric movement  Mini squat 3x 10 5# DB; cuing to maintain upright posture  Sidestepping with BTB 3x10; min cuing not drag feet  Backwards step up to green step 3x 10; w/ difficulty using LLE with heavy use of CLE  Hip Ext bilat on omega 3x10 40#   Backward walking 0.16mph 59min for hip ext encouragement, with heavy cuing for reciprocal stepping and prevention LE crossover with some carry over  PT Education - 02/19/21 1031    Education Details therex form/technique    Person(s) Educated Patient    Methods Explanation;Demonstration;Verbal cues    Comprehension Verbalized understanding;Returned demonstration;Verbal cues required            PT Short Term Goals - 01/19/21 1622      PT SHORT TERM GOAL #1   Title Pt will be independent with HEP in order to improve strength and posture to decrease LBP to partake in pain free function at home and community.    Baseline 01/19/21 HEP given    Time 4    Period Weeks    Status New             PT Long Term Goals - 01/19/21 1626      PT LONG TERM GOAL #1   Title Pt will decrease 5TSTS to at least 11sec to demonstrate improved LE  strength for age predicted norms    Baseline 01/19/21 12.53sec    Time 8    Period Weeks    Status New      PT LONG TERM GOAL #2   Title Pt will be able to walk the full 6MWT to demonstrate significant  improvment in cardiovascular endurance to demonstrate safe community ambulation    Baseline 01/19/21 3 min 8 sec    Time 8    Period Weeks      PT LONG TERM GOAL #3   Title Patient will increase FOTO score to 57 to demonstrate predicted increase in functional mobility to complete ADLs    Baseline 01/19/21 45    Time 8    Period Weeks    Status New                 Plan - 02/19/21 1033    Clinical Impression Statement PT continued therex progression for increased bilat hip strength and ext mobility, and functional movements with success. Patient is able to comply with all cuing for proper technique with some difficulty from LLE during SL weight bearing therex.  PT will continue progression as able.    Personal Factors and Comorbidities Age;Comorbidity 1;Comorbidity 2;Past/Current Experience;Time since onset of injury/illness/exacerbation;Fitness    Comorbidities HTN, lumbar stenosis    Examination-Activity Limitations Stand;Patent attorney for Others;Lift;Squat;Stairs;Transfers    Examination-Participation Restrictions Community Activity;Interpersonal Relationship;Shop;Cleaning;Laundry;Yard Work    Merchant navy officer Evolving/Moderate complexity    Clinical Decision Making Moderate    Rehab Potential Good    PT Frequency 2x / week    PT Duration 8 weeks    PT Treatment/Interventions ADLs/Self Care Home Management;Cryotherapy;Electrical Stimulation;Moist Heat;Traction;Gait training;Stair training;Functional mobility training;Therapeutic activities;Therapeutic exercise;Patient/family education;Cognitive remediation;Neuromuscular re-education;Balance training;Manual techniques;Joint Manipulations;Spinal Manipulations;Passive range of motion;Dry  needling;Fluidtherapy;Contrast Bath;DME Instruction;Ultrasound;Iontophoresis 4mg /ml Dexamethasone    PT Next Visit Plan HEP review, Therex progression, FGA    PT Home Exercise Plan STS, standing abd/add, supine press up    Consulted and Agree with Plan of Care Patient           Patient will benefit from skilled therapeutic intervention in order to improve the following deficits and impairments:  Abnormal gait,Decreased activity tolerance,Cardiopulmonary status limiting activity,Decreased balance,Decreased endurance,Decreased mobility,Decreased range of motion,Decreased strength,Difficulty walking,Hypomobility,Postural dysfunction,Improper body mechanics,Decreased coordination,Impaired flexibility,Pain  Visit Diagnosis: Chronic bilateral low back pain without sciatica  Decreased mobility and endurance     Problem List Patient Active Problem List   Diagnosis Date Noted  . Dyspnea on exertion 04/19/2020  . Aortic atherosclerosis ([place]) 04/19/2020  . Essential hypertension 04/19/2020  .  Hyperlipidemia 04/19/2020  . Right carpal tunnel syndrome 11/09/2016   Durwin Reges DPT Turner Daniels, SPT Durwin Reges 02/19/2021, 3:05 PM  [place] [facility] [address], [place], [address] Phone: [phone]   Fax:  [phone]  Name: Michelle Cisneros MRN: [id number] Date of Birth: 1956-01-10

## 2021-02-23 ENCOUNTER — Encounter: Payer: Self-pay | Admitting: Physical Therapy

## 2021-02-23 ENCOUNTER — Other Ambulatory Visit: Payer: Self-pay

## 2021-02-23 ENCOUNTER — Ambulatory Visit: Payer: PPO | Admitting: Physical Therapy

## 2021-02-23 DIAGNOSIS — M545 Low back pain, unspecified: Secondary | ICD-10-CM

## 2021-02-23 DIAGNOSIS — Z7409 Other reduced mobility: Secondary | ICD-10-CM

## 2021-02-23 DIAGNOSIS — G8929 Other chronic pain: Secondary | ICD-10-CM

## 2021-02-23 NOTE — Therapy (Signed)
[place] [place] [facility] [address], [place], [address] Phone: [phone]   Fax:  [phone]  Physical Therapy Treatment  Patient Details  Name: Michelle Cisneros MRN: [id number] Date of Birth: 01-14-1956 No data recorded  Encounter Date: 02/23/2021   PT End of Session - 02/23/21 1122    Visit Number 6    Number of Visits 17    Date for PT Re-Evaluation 03/20/21    Authorization - Visit Number 6    Authorization - Number of Visits 17    PT Start Time 1115    PT Stop Time 1155    PT Time Calculation (min) 40 min    Activity Tolerance Patient tolerated treatment well;Patient limited by fatigue    Behavior During Therapy [facility] for tasks assessed/performed           Past Medical History:  Diagnosis Date  . Arthritis   . Back pain   . Collagen vascular disease ([place])   . Depression   . Hyperlipemia   . Hypertension   . Melanoma ([place])   . Recurrent UTI     Past Surgical History:  Procedure Laterality Date  . ABDOMINAL HYSTERECTOMY    . COLONOSCOPY WITH PROPOFOL N/A 05/26/2020   Procedure: COLONOSCOPY WITH PROPOFOL;  Surgeon: Toledo, Benay Pike, MD;  Location: [facility] ENDOSCOPY;  Service: Gastroenterology;  Laterality: N/A;  . INCONTINENCE SURGERY    . OOPHORECTOMY      There were no vitals filed for this visit.   Subjective Assessment - 02/23/21 1119    Subjective Patient reports continued pain that feels like it is "the same" rating it 3/10 today. Reports that she has not been completing HEP or walking, and this this is probably part of the issue. She does not know how far she can walk, reporting that she has not tried recently.    Pertinent History pt is 65 yo female with c/c LBP bilaterally and decreased endurance with previous hx PT for treating a previous compression fracture and lateral stenosis. Pain gets worse with standing erect for about 10 minutes and also with walking 10 min unless using an assistive  device. Currently 0/10 with worst pain 7/10. Pain will ease when out of position or when stopping walking. In the morning feels more stiff and gets better through the day after moving around. Pt would like to get back to prolonged walking without any use of support or increases in pain. Pt denies any N/T, B/B, unexplained weight loss, or unrelenting night pains.    Limitations Standing;Walking;House hold activities    How long can you sit comfortably? Unlimited    How long can you stand comfortably? 10    How long can you walk comfortably? 10    Diagnostic tests None    Patient Stated Goals Prolonged walking without pain and having to use a device    Pain Onset More than a month ago            Therex  Nustep L4>5 UE setting 9; LE 8 53mins for gentle rotation and low grade strengthening/endurance  Squat 3x6 #5 DB; focus on power with concentric movement increased speed, cuing to attempt to touch chair with bottom with decent carry over  Alt side stepping over 6in hurdle x20; alt lateral step ups onto 6in step 2x 20 some difficulty with L knee with lateral step ups  Sidestepping with BTB 3x 12 steps each direction; min cuing not drag feet  Hip  Ext bilat on omega 3x10 40# with min cuing for eccentric control with good carry over  Backward walking 0.36mph 57min for hip ext encouragement, with heavy cuing for reciprocal stepping and prevention LE crossover with some carry over                           PT Education - 02/23/21 1121    Education Details therex form/technique    Person(s) Educated Patient    Methods Explanation;Demonstration;Verbal cues    Comprehension Returned demonstration;Verbalized understanding;Verbal cues required            PT Short Term Goals - 01/19/21 1622      PT SHORT TERM GOAL #1   Title Pt will be independent with HEP in order to improve strength and posture to decrease LBP to partake in pain free function at home and community.     Baseline 01/19/21 HEP given    Time 4    Period Weeks    Status New             PT Long Term Goals - 01/19/21 1626      PT LONG TERM GOAL #1   Title Pt will decrease 5TSTS to at least 11sec to demonstrate improved LE strength for age predicted norms    Baseline 01/19/21 12.53sec    Time 8    Period Weeks    Status New      PT LONG TERM GOAL #2   Title Pt will be able to walk the full 6MWT to demonstrate significant  improvment in cardiovascular endurance to demonstrate safe community ambulation    Baseline 01/19/21 3 min 8 sec    Time 8    Period Weeks      PT LONG TERM GOAL #3   Title Patient will increase FOTO score to 57 to demonstrate predicted increase in functional mobility to complete ADLs    Baseline 01/19/21 45    Time 8    Period Weeks    Status New                 Plan - 02/23/21 1134    Clinical Impression Statement PT continued therex progression for increased BLE strength and endurance and upright tolerance with success. PT encouraged patient to complete HEP for increased success with patient verbalizing understanding. Patient is able to comply with all cuing for proper technique of therex with good motivation throughout session. PT will continue progression as able.    Personal Factors and Comorbidities Age;Comorbidity 1;Comorbidity 2;Past/Current Experience;Time since onset of injury/illness/exacerbation;Fitness    Comorbidities HTN, lumbar stenosis    Examination-Activity Limitations Stand;Patent attorney for Others;Lift;Squat;Stairs;Transfers    Examination-Participation Restrictions Community Activity;Interpersonal Relationship;Shop;Cleaning;Laundry;Yard Work    Merchant [employer] officer Evolving/Moderate complexity    Clinical Decision Making Moderate    Rehab Potential Good    PT Frequency 2x / week    PT Duration 8 weeks    PT Treatment/Interventions ADLs/Self Care Home Management;Cryotherapy;Electrical Stimulation;Moist  Heat;Traction;Gait training;Stair training;Functional mobility training;Therapeutic activities;Therapeutic exercise;Patient/family education;Cognitive remediation;Neuromuscular re-education;Balance training;Manual techniques;Joint Manipulations;Spinal Manipulations;Passive range of motion;Dry needling;Fluidtherapy;Contrast Bath;DME Instruction;Ultrasound;Iontophoresis 4mg /ml Dexamethasone    PT Next Visit Plan HEP review, Therex progression, FGA    PT Home Exercise Plan STS, standing abd/add, supine press up    Consulted and Agree with Plan of Care Patient           Patient will benefit from skilled therapeutic intervention in order to improve the following deficits  and impairments:  Abnormal gait,Decreased activity tolerance,Cardiopulmonary status limiting activity,Decreased balance,Decreased endurance,Decreased mobility,Decreased range of motion,Decreased strength,Difficulty walking,Hypomobility,Postural dysfunction,Improper body mechanics,Decreased coordination,Impaired flexibility,Pain  Visit Diagnosis: Chronic bilateral low back pain without sciatica  Decreased mobility and endurance  Chronic midline low back pain without sciatica     Problem List Patient Active Problem List   Diagnosis Date Noted  . Dyspnea on exertion 04/19/2020  . Aortic atherosclerosis ([place]) 04/19/2020  . Essential hypertension 04/19/2020  . Hyperlipidemia 04/19/2020  . Right carpal tunnel syndrome 11/09/2016   Durwin Reges DPT Durwin Reges 02/23/2021, 11:56 AM  [place] [facility] [address], [place], [address] Phone: [phone]   Fax:  [phone]  Name: Kiannah Grunow MRN: [id number] Date of Birth: 02-17-1956

## 2021-02-25 ENCOUNTER — Ambulatory Visit: Payer: PPO | Admitting: Physical Therapy

## 2021-02-26 ENCOUNTER — Encounter: Payer: PPO | Admitting: Physical Therapy

## 2021-02-26 ENCOUNTER — Other Ambulatory Visit: Payer: Self-pay

## 2021-02-26 ENCOUNTER — Encounter: Payer: Self-pay | Admitting: Physical Therapy

## 2021-02-26 ENCOUNTER — Ambulatory Visit: Payer: PPO | Admitting: Physical Therapy

## 2021-02-26 DIAGNOSIS — M545 Low back pain, unspecified: Secondary | ICD-10-CM

## 2021-02-26 DIAGNOSIS — G8929 Other chronic pain: Secondary | ICD-10-CM

## 2021-02-26 DIAGNOSIS — Z7409 Other reduced mobility: Secondary | ICD-10-CM

## 2021-02-26 NOTE — Therapy (Signed)
[place] [facility] [address], [place], [address] Phone: [phone]   Fax:  [phone]  Physical Therapy Treatment  Patient Details  Name: Michelle Cisneros MRN: [id number] Date of Birth: October 25, 1956 No data recorded  Encounter Date: 02/26/2021   PT End of Session - 02/26/21 1616    Visit Number 7    Number of Visits 17    Date for PT Re-Evaluation 03/20/21    Authorization - Visit Number 6    Authorization - Number of Visits 17    PT Start Time 1324    PT Stop Time 1638    PT Time Calculation (min) 41 min    Activity Tolerance Patient tolerated treatment well;Patient limited by fatigue    Behavior During Therapy [facility] for tasks assessed/performed           Past Medical History:  Diagnosis Date  . Arthritis   . Back pain   . Collagen vascular disease ([place])   . Depression   . Hyperlipemia   . Hypertension   . Melanoma ([place])   . Recurrent UTI     Past Surgical History:  Procedure Laterality Date  . ABDOMINAL HYSTERECTOMY    . COLONOSCOPY WITH PROPOFOL N/A 05/26/2020   Procedure: COLONOSCOPY WITH PROPOFOL;  Surgeon: Toledo, Benay Pike, MD;  Location: [facility] ENDOSCOPY;  Service: Gastroenterology;  Laterality: N/A;  . INCONTINENCE SURGERY    . OOPHORECTOMY      There were no vitals filed for this visit.   Subjective Assessment - 02/26/21 1602    Subjective No LBP today. Reports she is having some L knee pain that is making stair negotiation difficult. Notices she has less pain and does better when she does her exercises    Pertinent History pt is 65 yo female with c/c LBP bilaterally and decreased endurance with previous hx PT for treating a previous compression fracture and lateral stenosis. Pain gets worse with standing erect for about 10 minutes and also with walking 10 min unless using an assistive device. Currently 0/10 with worst pain 7/10. Pain will ease when out of position or when stopping walking.  In the morning feels more stiff and gets better through the day after moving around. Pt would like to get back to prolonged walking without any use of support or increases in pain. Pt denies any N/T, B/B, unexplained weight loss, or unrelenting night pains.    Limitations Standing;Walking;House hold activities    How long can you sit comfortably? Unlimited    How long can you stand comfortably? 10    How long can you walk comfortably? 10    Diagnostic tests None    Patient Stated Goals Prolonged walking without pain and having to use a device    Pain Onset More than a month ago             Therex Nustep L4>5 UE setting 9; LE 8 30mins for gentle rotation and low grade strengthening/endurance  Stair assessment: able to complete reciprocol with little to no control of L knee with R step down  L step downs from 6in step unable to complete with proper technique; with 3in step with heavy TC ultimately unable to complete with proper form without pain   MATRIX hip abd 40# 3x 10 bilat with difficulty with standing on LLE with R hip abd  Monster walks 3x 59ft GTB at thighs and ankles with cuing for eccentric control  OMEGA leg press 3x 10  with cuing for neutral alignment with good carry over  Backward walking 1.70mph 26min for hip ext encouragement, with heavy cuing for reciprocal steppingand prevention LE crossoverwith some carry over            PT Education - 02/26/21 1604    Education Details therex form/technique    Person(s) Educated Patient    Methods Explanation;Demonstration;Verbal cues    Comprehension Verbalized understanding;Returned demonstration;Verbal cues required            PT Short Term Goals - 01/19/21 1622      PT SHORT TERM GOAL #1   Title Pt will be independent with HEP in order to improve strength and posture to decrease LBP to partake in pain free function at home and community.    Baseline 01/19/21 HEP given    Time 4    Period Weeks    Status  New             PT Long Term Goals - 01/19/21 1626      PT LONG TERM GOAL #1   Title Pt will decrease 5TSTS to at least 11sec to demonstrate improved LE strength for age predicted norms    Baseline 01/19/21 12.53sec    Time 8    Period Weeks    Status New      PT LONG TERM GOAL #2   Title Pt will be able to walk the full 6MWT to demonstrate significant  improvment in cardiovascular endurance to demonstrate safe community ambulation    Baseline 01/19/21 3 min 8 sec    Time 8    Period Weeks      PT LONG TERM GOAL #3   Title Patient will increase FOTO score to 57 to demonstrate predicted increase in functional mobility to complete ADLs    Baseline 01/19/21 45    Time 8    Period Weeks    Status New                 Plan - 02/26/21 1632    Clinical Impression Statement PT continued therex progression for increased BLE strength and core strength with success. Patient is demonstrating decreased L hip ER/Abd strength with difficulty with reciprocol step downs of stairs d/t this. PT address this with strengthening of L hip, which patient is able to comply with all cuing for. Patient is able to demonstrate understanding of proper neutral LE alignment and strengthening through HEP for this with success.    Personal Factors and Comorbidities Age;Comorbidity 1;Comorbidity 2;Past/Current Experience;Time since onset of injury/illness/exacerbation;Fitness    Comorbidities HTN, lumbar stenosis    Examination-Activity Limitations Stand;Patent attorney for Others;Lift;Squat;Stairs;Transfers    Examination-Participation Restrictions Community Activity;Interpersonal Relationship;Shop;Cleaning;Laundry;Yard Work    Merchant navy officer Evolving/Moderate complexity    Clinical Decision Making Moderate    Rehab Potential Good    PT Frequency 2x / week    PT Duration 8 weeks    PT Treatment/Interventions ADLs/Self Care Home Management;Cryotherapy;Electrical  Stimulation;Moist Heat;Traction;Gait training;Stair training;Functional mobility training;Therapeutic activities;Therapeutic exercise;Patient/family education;Cognitive remediation;Neuromuscular re-education;Balance training;Manual techniques;Joint Manipulations;Spinal Manipulations;Passive range of motion;Dry needling;Fluidtherapy;Contrast Bath;DME Instruction;Ultrasound;Iontophoresis 4mg /ml Dexamethasone    PT Next Visit Plan HEP review, Therex progression, FGA    PT Home Exercise Plan STS, standing abd/add, supine press up    Consulted and Agree with Plan of Care Patient           Patient will benefit from skilled therapeutic intervention in order to improve the following deficits and impairments:  Abnormal gait,Decreased activity tolerance,Cardiopulmonary status  limiting activity,Decreased balance,Decreased endurance,Decreased mobility,Decreased range of motion,Decreased strength,Difficulty walking,Hypomobility,Postural dysfunction,Improper body mechanics,Decreased coordination,Impaired flexibility,Pain  Visit Diagnosis: No diagnosis found.     Problem List Patient Active Problem List   Diagnosis Date Noted  . Dyspnea on exertion 04/19/2020  . Aortic atherosclerosis ([place]) 04/19/2020  . Essential hypertension 04/19/2020  . Hyperlipidemia 04/19/2020  . Right carpal tunnel syndrome 11/09/2016    Durwin Reges 02/26/2021, 4:35 PM  [place] [facility] [address], [place], [address] Phone: [phone]   Fax:  [phone]  Name: Michelle Cisneros MRN: [id number] Date of Birth: 1956/12/01

## 2021-03-03 ENCOUNTER — Encounter: Payer: PPO | Admitting: Physical Therapy

## 2021-03-04 ENCOUNTER — Encounter: Payer: PPO | Admitting: Physical Therapy

## 2021-03-05 ENCOUNTER — Encounter: Payer: PPO | Admitting: Physical Therapy

## 2021-03-06 ENCOUNTER — Other Ambulatory Visit: Payer: Self-pay

## 2021-03-06 ENCOUNTER — Ambulatory Visit: Payer: PPO | Admitting: Physical Therapy

## 2021-03-06 ENCOUNTER — Encounter: Payer: Self-pay | Admitting: Physical Therapy

## 2021-03-06 DIAGNOSIS — G8929 Other chronic pain: Secondary | ICD-10-CM

## 2021-03-06 DIAGNOSIS — M545 Low back pain, unspecified: Secondary | ICD-10-CM | POA: Diagnosis not present

## 2021-03-06 NOTE — Therapy (Signed)
[place] [facility] [address], [place], [address] Phone: [phone]   Fax:  [phone]  Physical Therapy Treatment  Patient Details  Name: Michelle Cisneros MRN: [id number] Date of Birth: [birth year] No data recorded  Encounter Date: 03/06/2021   PT End of Session - 03/06/21 1015    Visit Number 8    Number of Visits 17    Date for PT Re-Evaluation 03/20/21    Authorization - Visit Number 8    Authorization - Number of Visits 17    PT Start Time 0626    PT Stop Time 9485    PT Time Calculation (min) 40 min    Activity Tolerance Patient tolerated treatment well;No increased pain    Behavior During Therapy [facility] for tasks assessed/performed           Past Medical History:  Diagnosis Date  . Arthritis   . Back pain   . Collagen vascular disease ([place])   . Depression   . Hyperlipemia   . Hypertension   . Melanoma ([place])   . Recurrent UTI     Past Surgical History:  Procedure Laterality Date  . ABDOMINAL HYSTERECTOMY    . COLONOSCOPY WITH PROPOFOL N/A 05/26/2020   Procedure: COLONOSCOPY WITH PROPOFOL;  Surgeon: Toledo, Benay Pike, MD;  Location: [facility] ENDOSCOPY;  Service: Gastroenterology;  Laterality: N/A;  . INCONTINENCE SURGERY    . OOPHORECTOMY      There were no vitals filed for this visit.   Subjective Assessment - 03/06/21 0930    Subjective No LBP today. reports her knee is locking up and continues to have difficulty with stairs. Reports that exercises are going better and was able to walk for about a half hour with no pain but reports her back tires out.    Pertinent History pt is 65 yo female with c/c LBP bilaterally and decreased endurance with previous hx PT for treating a previous compression fracture and lateral stenosis. Pain gets worse with standing erect for about 10 minutes and also with walking 10 min unless using an assistive device. Currently 0/10 with worst pain 7/10. Pain will ease when  out of position or when stopping walking. In the morning feels more stiff and gets better through the day after moving around. Pt would like to get back to prolonged walking without any use of support or increases in pain. Pt denies any N/T, B/B, unexplained weight loss, or unrelenting night pains.    Limitations Standing;Walking;House hold activities    How long can you sit comfortably? Unlimited    How long can you stand comfortably? 10    How long can you walk comfortably? 10    Diagnostic tests None    Patient Stated Goals Prolonged walking without pain and having to use a device    Currently in Pain? No/denies    Pain Descriptors / Indicators Tiring;Discomfort    Pain Type Chronic pain    Pain Onset More than a month ago    Pain Frequency Intermittent          TherEx:   Nustep L4>5UE setting 9; LE 8 83mins for gentle rotation and low grade strengthening/endurance  Steps:up and down 5 times, repeated twice, cueing for reciprocal and toe alignment  Step ups - set of 10 X2 - leading with each leg, repeated twice - cueing for slow and controlled decent and keeping toes aligned  Supported hip extension : standing with handson table to provide  slight flexion for spinal stenosis - BTB - 3 sets of 10 both legs bilat: cueing to engage core to prevent arching at the back  Squats with 5# weight cueing to stand up fast and control the decent: 3 sets of 12  Omega leg press with toes turned out to work hip ER 1 set of 12 with- 45#, 2 sets of 15 with 55#, cueing to keep knees in line and control the motion   Pt will continue currentHEP adding hip extension with BTB and demonstrated good understanding and motivation.                      PT Education - 03/06/21 1023    Education Details therex form, alignment of LE when performing stairs and exercises    Person(s) Educated Patient    Methods Explanation;Demonstration;Verbal cues    Comprehension Returned demonstration;Verbalized  understanding;Verbal cues required            PT Short Term Goals - 01/19/21 1622      PT SHORT TERM GOAL #1   Title Pt will be independent with HEP in order to improve strength and posture to decrease LBP to partake in pain free function at home and community.    Baseline 01/19/21 HEP given    Time 4    Period Weeks    Status New             PT Long Term Goals - 01/19/21 1626      PT LONG TERM GOAL #1   Title Pt will decrease 5TSTS to at least 11sec to demonstrate improved LE strength for age predicted norms    Baseline 01/19/21 12.53sec    Time 8    Period Weeks    Status New      PT LONG TERM GOAL #2   Title Pt will be able to walk the full 6MWT to demonstrate significant  improvment in cardiovascular endurance to demonstrate safe community ambulation    Baseline 01/19/21 3 min 8 sec    Time 8    Period Weeks      PT LONG TERM GOAL #3   Title Patient will increase FOTO score to 57 to demonstrate predicted increase in functional mobility to complete ADLs    Baseline 01/19/21 45    Time 8    Period Weeks    Status New                 Plan - 03/06/21 1016    Clinical Impression Statement PT continued therex to increase bilateral LE strengthwith no increase in pain. Patient demonstrated improved performance of steps with no increase in pain and consistent recirpocal pattern but did demosntrate decreased hip extension and lack of control of the knee. PT address this with hip strengthening exercises and eccentric step control with cueing to keep the core engaged throughout. Patient demosntrated good understanding of proper HEP form with no increase in symptoms.    Personal Factors and Comorbidities Age;Comorbidity 1;Comorbidity 2;Past/Current Experience;Time since onset of injury/illness/exacerbation;Fitness    Examination-Activity Limitations Stand;Patent attorney for Others;Lift;Squat;Stairs;Transfers    Examination-Participation Restrictions Community  Activity;Interpersonal Relationship;Shop;Cleaning;Laundry;Yard Work    Merchant navy officer Evolving/Moderate complexity    Clinical Decision Making Moderate    Rehab Potential Good    PT Frequency 2x / week    PT Duration 8 weeks    PT Treatment/Interventions ADLs/Self Care Home Management;Cryotherapy;Electrical Stimulation;Moist Heat;Traction;Gait training;Stair training;Functional mobility training;Therapeutic activities;Therapeutic exercise;Patient/family education;Cognitive remediation;Neuromuscular re-education;Balance training;Manual  techniques;Joint Manipulations;Spinal Manipulations;Passive range of motion;Dry needling;Fluidtherapy;Contrast Bath;DME Instruction;Ultrasound;Iontophoresis 4mg /ml Dexamethasone    PT Next Visit Plan HEP review, progress therex, assess gait    PT Home Exercise Plan STS, standing abd/add, supine press up, standing hip extension    Consulted and Agree with Plan of Care Patient           Patient will benefit from skilled therapeutic intervention in order to improve the following deficits and impairments:  Abnormal gait,Decreased activity tolerance,Cardiopulmonary status limiting activity,Decreased balance,Decreased endurance,Decreased mobility,Decreased range of motion,Decreased strength,Difficulty walking,Hypomobility,Postural dysfunction,Improper body mechanics,Decreased coordination,Impaired flexibility,Pain  Visit Diagnosis: Chronic bilateral low back pain without sciatica  Chronic midline low back pain without sciatica     Problem List Patient Active Problem List   Diagnosis Date Noted  . Dyspnea on exertion 04/19/2020  . Aortic atherosclerosis ([place]) 04/19/2020  . Essential hypertension 04/19/2020  . Hyperlipidemia 04/19/2020  . Right carpal tunnel syndrome 11/09/2016    Durwin Reges DPT [id number], SPT  Durwin Reges 03/06/2021, 11:45 AM  [place] [facility] [address], [place], [address] Phone: [phone]   Fax:  [phone]  Name: Miliyah Luper MRN: [id number] Date of Birth: September 16, 1956

## 2021-03-10 ENCOUNTER — Ambulatory Visit: Payer: PPO | Admitting: Physical Therapy

## 2021-03-10 ENCOUNTER — Encounter: Payer: Self-pay | Admitting: Physical Therapy

## 2021-03-10 ENCOUNTER — Encounter: Payer: PPO | Admitting: Physical Therapy

## 2021-03-10 ENCOUNTER — Other Ambulatory Visit: Payer: Self-pay

## 2021-03-10 DIAGNOSIS — M545 Low back pain, unspecified: Secondary | ICD-10-CM | POA: Diagnosis not present

## 2021-03-10 DIAGNOSIS — G8929 Other chronic pain: Secondary | ICD-10-CM

## 2021-03-10 NOTE — Therapy (Signed)
[facility] [address] [place], [address] Phone: [phone]   Fax:  [phone]  Physical Therapy Treatment  Patient Details  Name: Michelle Cisneros MRN: [id number] Date of Birth: 1956-09-28 No data recorded  Encounter Date: 03/10/2021   PT End of Session - 03/10/21 0945    Visit Number 9    Number of Visits 17    Date for PT Re-Evaluation 03/20/21    Authorization - Visit Number 9    Authorization - Number of Visits 17    PT Start Time 0900    PT Stop Time 2694    PT Time Calculation (min) 43 min    Activity Tolerance Patient tolerated treatment well;No increased pain    Behavior During Therapy WFL for tasks assessed/performed           Past Medical History:  Diagnosis Date  . Arthritis   . Back pain   . Collagen vascular disease ([place])   . Depression   . Hyperlipemia   . Hypertension   . Melanoma ([place])   . Recurrent UTI     Past Surgical History:  Procedure Laterality Date  . ABDOMINAL HYSTERECTOMY    . COLONOSCOPY WITH PROPOFOL N/A 05/26/2020   Procedure: COLONOSCOPY WITH PROPOFOL;  Surgeon: Toledo, Benay Pike, MD;  Location: [facility];  Service: Gastroenterology;  Laterality: N/A;  . INCONTINENCE SURGERY    . OOPHORECTOMY      There were no vitals filed for this visit.   Subjective Assessment - 03/10/21 0901    Subjective Patient reports no back pain today. Patient reports that she has been walking more and going to the gym more frequently with no pain. Patient reportsL knee pain and feeling unsteady when navigating stairs.    Pertinent History pt is 65 yo female with c/c LBP bilaterally and decreased endurance with previous hx PT for treating a previous compression fracture and lateral stenosis. Pain gets worse with standing erect for about 10 minutes and also with walking 10 min unless using an assistive device. Currently 0/10 with worst pain 7/10. Pain will ease when out of  position or when stopping walking. In the morning feels more stiff and gets better through the day after moving around. Pt would like to get back to prolonged walking without any use of support or increases in pain. Pt denies any N/T, B/B, unexplained weight loss, or unrelenting night pains.    Limitations Standing;Walking;House hold activities    How long can you sit comfortably? Unlimited    How long can you stand comfortably? 10    How long can you walk comfortably? 10    Diagnostic tests None    Patient Stated Goals Prolonged walking without pain and having to use a device    Currently in Pain? No/denies           TherEx:   Nustep L5UE setting 9; LE 8 22mins for gentle rotation and low grade strengthening/endurance   Squat to table with hip ABD at the top using 2# ankle weights 3 sets of 10   Supported hip extension: standing with hands on table to provide slight flexion for spinal stenosis - 1 set of 10 on each leg with 2# ankle weights  - 2 sets of 10 both legs with 3# ankle weight   Omega leg press with toes turned out to work hip ER 1 set of 15with- 65#, 2 sets of 12 with 75#, cueing to push fast  and control motion on the way down   Lunges on a 6 in step, bilateral 2 sets of 10 - cueing to keep knees steady, challenging to do without balance support   6 in step up with hip ABD at the top, bilateral 2 sets of 10: cueing to avoid thrusting the knee into a locked position on the way up and control motion on the way down  Up and down steps 4 times, repeated twice cueing to keep toes forward and go down slowly, patient using alternating pattern more consistently. Able to do the second set of 4 without hand rails   Pt will continue current HEP and demonstrated good understanding and motivation          PT Education - 03/10/21 0907    Education Details therex form, importance of strengthening hip ABD to help control knee motion    Person(s) Educated Patient    Methods  Explanation;Demonstration;Verbal cues    Comprehension Verbalized understanding;Returned demonstration;Verbal cues required            PT Short Term Goals - 01/19/21 1622      PT SHORT TERM GOAL #1   Title Pt will be independent with HEP in order to improve strength and posture to decrease LBP to partake in pain free function at home and community.    Baseline 01/19/21 HEP given    Time 4    Period Weeks    Status New             PT Long Term Goals - 01/19/21 1626      PT LONG TERM GOAL #1   Title Pt will decrease 5TSTS to at least 11sec to demonstrate improved LE strength for age predicted norms    Baseline 01/19/21 12.53sec    Time 8    Period Weeks    Status New      PT LONG TERM GOAL #2   Title Pt will be able to walk the full 6MWT to demonstrate significant  improvment in cardiovascular endurance to demonstrate safe community ambulation    Baseline 01/19/21 3 min 8 sec    Time 8    Period Weeks      PT LONG TERM GOAL #3   Title Patient will increase FOTO score to 57 to demonstrate predicted increase in functional mobility to complete ADLs    Baseline 01/19/21 45    Time 8    Period Weeks    Status New           .     Abnormal gait,Decreased activity tolerance,Cardiopulmonary status limiting activity,Decreased balance,Decreased endurance,Decreased mobility,Decreased range of motion,Decreased strength,Difficulty walking,Hypomobility,Postural dysfunction,Improper body mechanics,Decreased coordination,Impaired flexibility,Pain  Visit Diagnosis: Chronic bilateral low back pain without sciatica     Problem List Patient Active Problem List   Diagnosis Date Noted  . Dyspnea on exertion 04/19/2020  . Aortic atherosclerosis ([place]) 04/19/2020  . Essential hypertension 04/19/2020  . Hyperlipidemia 04/19/2020  . Right carpal tunnel syndrome 11/09/2016     Durwin Reges DPT [address], SPT  Durwin Reges 03/10/2021, 2:11 PM  [facility] [address], [place], [address] Phone: [phone]   Fax:  [phone]  Name: Michelle Cisneros MRN: [id number] Date of Birth: [birth year]

## 2021-03-12 ENCOUNTER — Encounter: Payer: PPO | Admitting: Physical Therapy

## 2021-03-12 ENCOUNTER — Encounter: Payer: Self-pay | Admitting: Physical Therapy

## 2021-03-12 ENCOUNTER — Other Ambulatory Visit: Payer: Self-pay

## 2021-03-12 ENCOUNTER — Ambulatory Visit: Payer: PPO | Admitting: Physical Therapy

## 2021-03-12 DIAGNOSIS — G8929 Other chronic pain: Secondary | ICD-10-CM

## 2021-03-12 DIAGNOSIS — M545 Low back pain, unspecified: Secondary | ICD-10-CM

## 2021-03-12 NOTE — Therapy (Signed)
[facility] [address] [place], [address] Phone: [phone]   Fax:  [phone]  Physical Therapy Treatment/ Discharge summary Reporting Period 01/19/2021- 03/12/2021   Patient Details  Name: Michelle Cisneros MRN: [id number] Date of Birth: [birth year] No data recorded  Encounter Date: 03/12/2021     PT End of Session - 03/12/21 1113    Visit Number 10    Number of Visits 17    Date for PT Re-Evaluation 03/20/21    Authorization - Visit Number 10    Authorization - Number of Visits 17    PT Start Time 1601    PT Stop Time 1100    PT Time Calculation (min) 31 min    Activity Tolerance Patient tolerated treatment well;No increased pain    Behavior During Therapy WFL for tasks assessed/performed           Past Medical History:  Diagnosis Date  . Arthritis   . Back pain   . Collagen vascular disease ([place])   . Depression   . Hyperlipemia   . Hypertension   . Melanoma ([place])   . Recurrent UTI     Past Surgical History:  Procedure Laterality Date  . ABDOMINAL HYSTERECTOMY    . COLONOSCOPY WITH PROPOFOL N/A 05/26/2020   Procedure: COLONOSCOPY WITH PROPOFOL;  Surgeon: Toledo, Benay Pike, MD;  Location: [facility];  Service: Gastroenterology;  Laterality: N/A;  . INCONTINENCE SURGERY    . OOPHORECTOMY      There were no vitals filed for this visit.   Subjective Assessment - 03/12/21 1028    Subjective Patient reports she feels 70% better, more problems with the knee than her back at this point. She feels like she could be independent with an HEP to continue improving.    Pertinent History pt is 65 yo female with c/c LBP bilaterally and decreased endurance with previous hx PT for treating a previous compression fracture and lateral stenosis. Pain gets worse with standing erect for about 10 minutes and also with walking 10 min unless using an assistive device. Currently 0/10 with worst pain 7/10.  Pain will ease when out of position or when stopping walking. In the morning feels more stiff and gets better through the day after moving around. Pt would like to get back to prolonged walking without any use of support or increases in pain. Pt denies any N/T, B/B, unexplained weight loss, or unrelenting night pains.    Limitations Standing;Walking;House hold activities    How long can you sit comfortably? Unlimited    How long can you stand comfortably? 10    How long can you walk comfortably? 10    Diagnostic tests None    Patient Stated Goals Prolonged walking without pain and having to use a device    Currently in Pain? No/denies    Pain Onset More than a month ago           TherEx:  Nustep L5UE setting 9; LE 8 49mins for gentle rotation and low grade strengthening/endurance  5TSTS - 2 trials, best time = 9.6 seconds  6MWT- able to complete full 6 minutes, 1520 feet   Patient demonstrated proper form of the following exercises and was motivated to continue with exercises. Patient educated on frequency, intensity, rep/set range and how to progress if they became easy.  Exercises Squat with Chair Touch - 1 x daily - 2-3 x weekly - 3 sets - 10 reps Standing Hip  Abduction with Resistance at Ankles and Counter Support - 1 x daily - 2-3 x weekly - 3 sets - 10 reps Standing Hip Extension with Resistance at Ankles and Counter Support - 1 x daily - 2-3 x weekly - 3 sets - 10 reps Prone Hip Extension with Resistance Loop - 1 x daily - 2-3 x weekly - 3 sets - 10 reps Side Stepping with Resistance at Ankles - 1 x daily - 2-3 x weekly - 3 sets - 10 reps                           PT Education - 03/12/21 1115    Education Details therex form, continuing exercises at home to continue progressing, proper dosing to avoid overuse    Person(s) Educated Patient    Methods Demonstration;Verbal cues;Explanation    Comprehension Verbalized understanding;Returned  demonstration;Verbal cues required            PT Short Term Goals - 03/12/21 1108      PT SHORT TERM GOAL #1   Title Pt will be independent with HEP in order to improve strength and posture to decrease LBP to partake in pain free function at home and community.    Baseline 01/19/21 HEP given, 03/12/21 HEP completed    Status Achieved             PT Long Term Goals - 03/12/21 1035      PT LONG TERM GOAL #1   Title Pt will decrease 5TSTS to at least 11sec to demonstrate improved LE strength for age predicted norms    Baseline 01/19/21 12.53sec 03/10/21 9.6    Time 8    Period Weeks    Status Achieved      PT LONG TERM GOAL #2   Title Pt will be able to walk the full 6MWT to demonstrate significant  improvment in cardiovascular endurance to demonstrate safe community ambulation    Baseline 01/19/21 3 min 8 sec, 03/12/21 1520 ft, able to complete entire test    Time 8    Period Weeks    Status Achieved      PT LONG TERM GOAL #3   Title Patient will increase FOTO score to 57 to demonstrate predicted increase in functional mobility to complete ADLs    Baseline 01/19/21 45, 03/12/21 74    Time 8    Period Weeks    Status Achieved                 Plan - 03/12/21 1109    Clinical Impression Statement PT assessed patient's progress and concluded that patient had acheived her goals and was doing great with her progress due to decrease pain and improvement in strength and endurance. PT educated patient on proper dosing, progressions, and importance of continuing exercises to keep progressing at home. Patient demonstrated good understanding of proper HEP form and demonstrated good motivation to continue exercises at home.    Personal Factors and Comorbidities Age;Comorbidity 1;Comorbidity 2;Past/Current Experience;Time since onset of injury/illness/exacerbation;Fitness    Comorbidities HTN, lumbar stenosis    Examination-Activity Limitations Stand;Patent attorney for  Others;Lift;Squat;Stairs;Transfers    Examination-Participation Restrictions Community Activity;Interpersonal Relationship;Shop;Cleaning;Laundry;Yard Work    Merchant navy officer Evolving/Moderate complexity    Clinical Decision Making Moderate    Rehab Potential Good    PT Frequency 2x / week    PT Duration 8 weeks    PT Treatment/Interventions ADLs/Self Care Home Management;Cryotherapy;Electrical Stimulation;Moist Heat;Traction;Gait training;Stair  training;Functional mobility training;Therapeutic activities;Therapeutic exercise;Patient/family education;Cognitive remediation;Neuromuscular re-education;Balance training;Manual techniques;Joint Manipulations;Spinal Manipulations;Passive range of motion;Dry needling;Fluidtherapy;Contrast Bath;DME Instruction;Ultrasound;Iontophoresis 4mg /ml Dexamethasone    PT Next Visit Plan DC PT    PT Home Exercise Plan STS, standing abd/add, supine hip ext, standing hip extension, lateral walks    Consulted and Agree with Plan of Care Patient           Patient will benefit from skilled therapeutic intervention in order to improve the following deficits and impairments:  Abnormal gait,Decreased activity tolerance,Cardiopulmonary status limiting activity,Decreased balance,Decreased endurance,Decreased mobility,Decreased range of motion,Decreased strength,Difficulty walking,Hypomobility,Postural dysfunction,Improper body mechanics,Decreased coordination,Impaired flexibility,Pain  Visit Diagnosis: Chronic bilateral low back pain without sciatica     Problem List Patient Active Problem List   Diagnosis Date Noted  . Dyspnea on exertion 04/19/2020  . Aortic atherosclerosis ([place]) 04/19/2020  . Essential hypertension 04/19/2020  . Hyperlipidemia 04/19/2020  . Right carpal tunnel syndrome 11/09/2016   Durwin Reges DPT [address], SPT  Durwin Reges 03/12/2021, 12:56 PM  [facility] [address] [place], [address] Phone: [phone]   Fax:  [phone]  Name: Michelle Cisneros MRN: [id number] Date of Birth: 01-30-1956

## 2021-03-13 DIAGNOSIS — R059 Cough, unspecified: Secondary | ICD-10-CM | POA: Diagnosis not present

## 2021-03-13 DIAGNOSIS — R002 Palpitations: Secondary | ICD-10-CM | POA: Diagnosis not present

## 2021-03-13 DIAGNOSIS — R0789 Other chest pain: Secondary | ICD-10-CM | POA: Diagnosis not present

## 2021-03-13 DIAGNOSIS — Z87891 Personal history of nicotine dependence: Secondary | ICD-10-CM | POA: Diagnosis not present

## 2021-03-17 ENCOUNTER — Encounter: Payer: PPO | Admitting: Physical Therapy

## 2021-03-17 ENCOUNTER — Ambulatory Visit: Payer: PPO | Admitting: Physical Therapy

## 2021-03-19 ENCOUNTER — Encounter: Payer: PPO | Admitting: Physical Therapy

## 2021-03-31 ENCOUNTER — Encounter: Payer: Self-pay | Admitting: Nurse Practitioner

## 2021-03-31 ENCOUNTER — Ambulatory Visit: Payer: PPO | Admitting: Nurse Practitioner

## 2021-03-31 ENCOUNTER — Other Ambulatory Visit: Payer: Self-pay

## 2021-03-31 VITALS — BP 120/80 | HR 72 | Ht 66.0 in | Wt 184.0 lb

## 2021-03-31 DIAGNOSIS — R002 Palpitations: Secondary | ICD-10-CM | POA: Diagnosis not present

## 2021-03-31 DIAGNOSIS — I10 Essential (primary) hypertension: Secondary | ICD-10-CM | POA: Diagnosis not present

## 2021-03-31 DIAGNOSIS — E782 Mixed hyperlipidemia: Secondary | ICD-10-CM

## 2021-03-31 DIAGNOSIS — R0789 Other chest pain: Secondary | ICD-10-CM | POA: Diagnosis not present

## 2021-03-31 NOTE — Progress Notes (Signed)
Office Visit    Patient Name: Yaneli Keithley Date of Encounter: 03/31/2021  Primary Care Provider:  Maryland Pink, MD Primary Cardiologist:  Nelva Bush, MD  Chief Complaint    Alanta Scobey is a 65 y/o female with history of aortic atherosclerosis, HTN, hyperlipidemia, chronic back pain following a fracture, thyroid nodule, and skin cancer who presents today for evaluation of left rib pain radiating up to right shoulder, fluttering in chest, cough, and nausea.   Past Medical History    Past Medical History:  Diagnosis Date  . Aortic atherosclerosis ([place])   . Arthritis   . Back pain   . Collagen vascular disease ([place])   . Depression   . DOE (dyspnea on exertion)    a. 05/2020 Echo: EF 60-65%, no rwma, nl RV size/fxn.  Marland Kitchen History of stress test    a. 04/2020 Lexiscan MV: No ischemia/infarct.  . Hyperlipemia   . Hypertension   . Melanoma ([place])   . Recurrent UTI    Past Surgical History:  Procedure Laterality Date  . ABDOMINAL HYSTERECTOMY    . COLONOSCOPY WITH PROPOFOL N/A 05/26/2020   Procedure: COLONOSCOPY WITH PROPOFOL;  Surgeon: Toledo, Benay Pike, MD;  Location: [facility];  Service: Gastroenterology;  Laterality: N/A;  . INCONTINENCE SURGERY    . OOPHORECTOMY      Allergies  No Known Allergies  History of Present Illness    66 y/o female with history of aortic atherosclerosis, HTN, hyperlipidemia, chronic back pain following a fracture, thyroid nodule, and skin cancer who presents today for evaluation of left rib pain radiating up to right shoulder, fluttering in chest, cough, and nausea. Her cardiac history is significant for establishing care in April 2021 for evaluation of chronic dyspnea on exertion, elevated BP, and incidentally discovered atherosclerosis involving the distal abdominal aorta and iliac arteries found on abdominal CT. Echocardiogram in June 2021 showed nl LVEF, no rwa, nl diastolic parameters, and no valvular abnormalities. Myoview  done in 2021 was nl. She was started on low dose atorvastatin for prevention of worsening aortic atherosclerosis. Since that time, she reports she has been doing well. She began having symptoms of pain in her left lower rib cage that radiated to her right shoulder associated with cough, and nausea more than 1 month ago.  She also noted occasional, brief episodes of fluttering in her chest during these episodes.  She did not experience rib symptoms or palpitations during the day but symptoms continue to occur intermittently specifically when lying down for bed at night.  She scheduled an appointment with PCP on 3/25. Lab work and CXR were ordered by PCP and were unremarkable.  She was referred back to cardiology due to concern re: palpitations.  Over the week however, she has been completely asymptomatic.  She notes that she is relatively sedentary but did walk a mile yesterday without any symptoms or limitations.  Home Medications    Prior to Admission medications   Medication Sig Start Date End Date Taking? Authorizing Provider  aspirin EC 81 MG tablet Take 1 tablet (81 mg total) by mouth daily. 04/18/20   End, Harrell Gave, MD  atorvastatin (LIPITOR) 10 MG tablet TAKE ONE TABLET BY MOUTH DAILY 02/13/18   [provider]  buPROPion (WELLBUTRIN XL) 300 MG 24 hr tablet TAKE ONE TABLET BY MOUTH DAILY 02/13/18   [provider]  Calcium Carbonate-Vitamin D 600-400 MG-UNIT tablet Take by mouth.    [provider]  fluticasone (FLONASE) 50 MCG/ACT nasal spray  Place 1 spray into both nostrils 2 (two) times daily. 05/15/20   [provider]  lisinopril (ZESTRIL) 20 MG tablet Take 1 tablet (20 mg total) by mouth daily. 04/18/20   End, Harrell Gave, MD  Omega-3 Fatty Acids (FISH OIL) 1000 MG CAPS Take 1 capsule by mouth daily.    [provider]  trimethoprim (TRIMPEX) 100 MG tablet Take 1 tablet (100 mg total) by mouth daily. 07/29/20   Bjorn Loser, MD    Review of  Systems    Notable for chest fluttering/palps as outlined above. She was also having left lower rib pain and right shoulder pain associated with cough, and nausea that occurred prior to appointment today but have not reoccurred x 1 week. She denies exertional chest pain, dyspnea, orthopnea, pnd, early satiety, edema, dizziness, or syncope.  All other systems reviewed and are otherwise negative except as noted above.  Physical Exam    VS:  BP 120/80 (BP Location: Left Arm, Patient Position: Sitting, Cuff Size: Normal)   Pulse 72   Ht 5\' 6"  (1.676 m)   Wt 184 lb (83.5 kg)   SpO2 98%   BMI 29.70 kg/m  , BMI Body mass index is 29.7 kg/m. GEN: Obese, well nourished, in no acute distress. HEENT: normal. Neck: Supple, no JVD, carotid bruits, or masses. Cardiac: RRR, no murmurs, rubs, or gallops. No clubbing, cyanosis, edema.  Radials/DP/PT 2+ and equal bilaterally.  Respiratory:  Respirations regular and unlabored, clear to auscultation bilaterally. GI: Soft, nontender, nondistended, BS + x 4. MS: no deformity or atrophy. Skin: warm and dry, no rash. Neuro:  Strength and sensation are intact. Psych: Normal affect.  Accessory Clinical Findings    ECG personally reviewed by me today - RSR @ 72 bpm, leftward axis deviation, no ST/T wave abnormalities - no acute changes.  Lab work reviewed in detail today:   March 13, 2021 Hemoglobin 14.6, hematocrit 44.8, WBC 6.5, platelets 290 Sodium 139, potassium 4.3, chloride 103, CO2 27.7, BUN 10, creatinine 0.7, glucose 92 Calcium 9.5, albumin 4.3, total protein 7.5 Total bilirubin 1.3, alkaline phosphatase 84, AST 21, ALT 25 TSH 0.605  December 29, 2020 Total cholesterol 194, triglycerides 109, HDL 85, LDL 87  Assessment & Plan    1.  Palpitations/left lower chest pain (?  Costochondritis): Over a period of about a month, patient was experiencing nightly brief and fleeting palpitations that occurred over a period of about an hour and resolve  spontaneously.  She was also experiencing left lower rib cage dull aching associated with cough and sometimes nausea.  All of the symptoms have since resolved.  She was seen by primary care in late March with lab work and chest x-ray, which were unremarkable.  She has not experienced symptoms during the day and notes that she is relatively active, taking care of her 3 grandchildren during the day.  She has not had any recurrent symptoms in the past week and was able to walk a mile yesterday without symptoms or limitations.  She is concerned about palpitations as her father has a history of atrial fibrillation.  In the absence of current symptoms, applying an event monitor may be low yield.  That said, if she has any recurrence of palpitations, we can mail her a Zio monitor.  We also discussed at home monitoring such as via a Kardia device or apple watch/fit bit.  She is going to explore apple watch options and will let us know if she would like to proceed  with monitoring.  With regards to her left lower rib cage pain, this is atypical.  Previously normal Myoview less than 1 year ago.  No further ischemic evaluation at this time.  2. Hyperlipidemia: LDL 87 in Jan 22. She is on low-dose atorvastatin and is tolerating this without concern. Continue current therapy.   3. Essential hypertension: BP is stable today. She is tolerating lisinopril without adverse effect. She does report a recent cough that occurred at night rather consistently for awhile but it has since resolved. If cough returns, we can consider changing her anti-hypertensive to another agent.   4.  Disposition: No medication changes today.  Follow-up in 6 months with Dr. Saunders Revel or APP, sooner if symptoms return.   Murray Hodgkins, NP 03/31/2021, 11:22 AM

## 2021-03-31 NOTE — Patient Instructions (Signed)
Medication Instructions:  No changes  *If you need a refill on your cardiac medications before your next appointment, please call your pharmacy*   Lab Work: None  If you have labs (blood work) drawn today and your tests are completely normal, you will receive your results only by: Marland Kitchen MyChart Message (if you have MyChart) OR . A paper copy in the mail If you have any lab test that is abnormal or we need to change your treatment, we will call you to review the results.   Testing/Procedures: None   Follow-Up: At [facility], you and your health needs are our priority.  As part of our continuing mission to provide you with exceptional heart care, we have created designated Provider Care Teams.  These Care Teams include your primary Cardiologist (physician) and Advanced Practice Providers (APPs -  Physician Assistants and Nurse Practitioners) who all work together to provide you with the care you need, when you need it.   Your next appointment:   6 month(s)  The format for your next appointment:   In Person  Provider:   Nelva Bush, MD or Murray Hodgkins, NP

## 2021-04-06 ENCOUNTER — Ambulatory Visit: Payer: PPO | Admitting: Internal Medicine

## 2021-04-13 ENCOUNTER — Encounter: Payer: Self-pay | Admitting: Dermatology

## 2021-04-13 ENCOUNTER — Other Ambulatory Visit: Payer: Self-pay | Admitting: *Deleted

## 2021-04-13 ENCOUNTER — Other Ambulatory Visit: Payer: Self-pay

## 2021-04-13 ENCOUNTER — Ambulatory Visit: Payer: PPO | Admitting: Dermatology

## 2021-04-13 DIAGNOSIS — L814 Other melanin hyperpigmentation: Secondary | ICD-10-CM | POA: Diagnosis not present

## 2021-04-13 DIAGNOSIS — D229 Melanocytic nevi, unspecified: Secondary | ICD-10-CM

## 2021-04-13 DIAGNOSIS — D1800 Hemangioma unspecified site: Secondary | ICD-10-CM

## 2021-04-13 DIAGNOSIS — L578 Other skin changes due to chronic exposure to nonionizing radiation: Secondary | ICD-10-CM | POA: Diagnosis not present

## 2021-04-13 DIAGNOSIS — D225 Melanocytic nevi of trunk: Secondary | ICD-10-CM | POA: Diagnosis not present

## 2021-04-13 DIAGNOSIS — L300 Nummular dermatitis: Secondary | ICD-10-CM

## 2021-04-13 DIAGNOSIS — L821 Other seborrheic keratosis: Secondary | ICD-10-CM

## 2021-04-13 DIAGNOSIS — Z85828 Personal history of other malignant neoplasm of skin: Secondary | ICD-10-CM

## 2021-04-13 DIAGNOSIS — L820 Inflamed seborrheic keratosis: Secondary | ICD-10-CM

## 2021-04-13 DIAGNOSIS — Z85820 Personal history of malignant melanoma of skin: Secondary | ICD-10-CM

## 2021-04-13 DIAGNOSIS — Z1283 Encounter for screening for malignant neoplasm of skin: Secondary | ICD-10-CM

## 2021-04-13 DIAGNOSIS — L719 Rosacea, unspecified: Secondary | ICD-10-CM | POA: Diagnosis not present

## 2021-04-13 MED ORDER — ASPIRIN EC 81 MG PO TBEC: 81.0000 mg | DELAYED_RELEASE_TABLET | Freq: Every day | ORAL | 2 refills | Status: DC

## 2021-04-13 MED ORDER — TRIAMCINOLONE ACETONIDE 0.1 % EX CREA: 1.0000 "application " | TOPICAL_CREAM | CUTANEOUS | 0 refills | Status: DC

## 2021-04-13 NOTE — Patient Instructions (Addendum)
If you have any questions or concerns for your doctor, please call our main line at [phone] and press option 4 to reach your doctor's medical assistant. If no one answers, please leave a voicemail as directed and we will return your call as soon as possible. Messages left after 4 pm will be answered the following business day.   You may also send [place] a message via [place]. We typically respond to MyChart messages within 1-2 business days.  For prescription refills, please ask your pharmacy to contact our office. Our fax number is [phone].  If you have an urgent issue when the clinic is closed that cannot wait until the next business day, you can page your doctor at the number below.    Please note that while we do our best to be available for urgent issues outside of office hours, we are not available 24/7.   If you have an urgent issue and are unable to reach [place], you may choose to seek medical care at your doctor's office, retail clinic, urgent care center, or emergency room.  If you have a medical emergency, please immediately call 911 or go to the emergency department.  Pager Numbers  - Dr. Nehemiah Massed: [phone]  - Dr. Laurence Ferrari: [phone]  - Dr. Nicole Kindred: [phone]  In the event of inclement weather, please call our main line at [phone] for an update on the status of any delays or closures.  Dermatology Medication Tips: Please keep the boxes that topical medications come in in order to help keep track of the instructions about where and how to use these. Pharmacies typically print the medication instructions only on the boxes and not directly on the medication tubes.   If your medication is too expensive, please contact our office at [phone] option 4 or send [place] a message through [place].   We are unable to tell what your co-pay for medications will be in advance as this is different depending on your insurance coverage. However, we may be able to find a substitute  medication at lower cost or fill out paperwork to get insurance to cover a needed medication.   If a prior authorization is required to get your medication covered by your insurance company, please allow [place] 1-2 business days to complete this process.  Drug prices often vary depending on where the prescription is filled and some pharmacies may offer cheaper prices.  The website www.goodrx.com contains coupons for medications through different pharmacies. The prices here do not account for what the cost may be with help from insurance (it may be cheaper with your insurance), but the website can give you the price if you did not use any insurance.  - You can print the associated coupon and take it with your prescription to the pharmacy.  - You may also stop by our office during regular business hours and pick up a GoodRx coupon card.  - If you need your prescription sent electronically to a different pharmacy, notify our office through [facility] or by phone at [phone] option 4.   Cryotherapy Aftercare  . Wash gently with soap and water everyday.   Marland Kitchen Apply Vaseline and Band-Aid daily until healed.   Seborrheic Keratosis  What causes seborrheic keratoses? Seborrheic keratoses are harmless, common skin growths that first appear during adult life.  As time goes by, more growths appear.  Some people may develop a large number of them.  Seborrheic keratoses appear on both covered and uncovered body parts.  They  are not caused by sunlight.  The tendency to develop seborrheic keratoses can be inherited.  They vary in color from skin-colored to gray, brown, or even black.  They can be either smooth or have a rough, warty surface.   Seborrheic keratoses are superficial and look as if they were stuck on the skin.  Under the microscope this type of keratosis looks like layers upon layers of skin.  That is why at times the top layer may seem to fall off, but the rest of the growth remains and  re-grows.    Treatment Seborrheic keratoses do not need to be treated, but can easily be removed in the office.  Seborrheic keratoses often cause symptoms when they rub on clothing or jewelry.  Lesions can be in the way of shaving.  If they become inflamed, they can cause itching, soreness, or burning.  Removal of a seborrheic keratosis can be accomplished by freezing, burning, or surgery. If any spot bleeds, scabs, or grows rapidly, please return to have it checked, as these can be an indication of a skin cancer.   Gentle Skin Care Guide  1. Bathe no more than once a day.  2. Avoid bathing in hot water  3. Use a mild soap like Dove, Vanicream, Cetaphil, CeraVe. Can use Lever 2000 or Cetaphil antibacterial soap  4. Use soap only where you need it. On most days, use it under your arms, between your legs, and on your feet. Let the water rinse other areas unless visibly dirty.  5. When you get out of the bath/shower, use a towel to gently blot your skin dry, don't rub it.  6. While your skin is still a little damp, apply a moisturizing cream such as Vanicream, CeraVe, Cetaphil, Eucerin, Sarna lotion or plain Vaseline Jelly. For hands apply Neutrogena [place] Hand Cream or Excipial Hand Cream.  7. Reapply moisturizer any time you start to itch or feel dry.  8. Sometimes using free and clear laundry detergents can be helpful. Fabric softener sheets should be avoided. Downy Free & Gentle liquid, or any liquid fabric softener that is free of dyes and perfumes, it acceptable to use  9. If your doctor has given you prescription creams you may apply moisturizers over them

## 2021-04-13 NOTE — Progress Notes (Signed)
Follow-Up Visit   Subjective  Michelle Cisneros is a 65 y.o. female who presents for the following: Total body skin exam (Hx of Melanoma R forearm, hx of BCC L forehead, hx of SCC L med ankle). Pt has itchy rash on lower legs.  Has dry skin.  Also has rosacea and is treating it with OTC rosacea cream which is helping.   The following portions of the chart were reviewed this encounter and updated as appropriate:       Review of Systems:  No other skin or systemic complaints except as noted in HPI or Assessment and Plan.  Objective  Well appearing patient in no apparent distress; mood and affect are within normal limits.  A full examination was performed including scalp, head, eyes, ears, nose, lips, neck, chest, axillae, abdomen, back, buttocks, bilateral upper extremities, bilateral lower extremities, hands, feet, fingers, toes, fingernails, and toenails. All findings within normal limits unless otherwise noted below.  Objective  Right Forearm: Well healed scar with no evidence of recurrence   Objective  Left med ankle: Well healed scar with no evidence of recurrence  Objective  L forehead: Well healed scar with no evidence of recurrence.   Objective  bil pretibial: Nummular pink patches bil pretibial  Left medial buttock; Right Lower Back  Objective  Right Lower Back: 5.0 x 2.78mm med light brown macule  Left medial buttock: 8.59mm med light brown macule  Objective  Head - Anterior (Face): Mild erythema cheeks and nose with telangiectasias  Objective  L frontal scalp x 1: Erythematous keratotic or waxy stuck-on papule   Assessment & Plan    Lentigines - Scattered tan macules - Due to sun exposure - Benign-appering, observe - Recommend daily broad spectrum sunscreen SPF 30+ to sun-exposed areas, reapply every 2 hours as needed. - Call for any changes  Seborrheic Keratoses - Stuck-on, waxy, tan-brown papules and/or plaques  - Benign-appearing -  Discussed benign etiology and prognosis. - Observe - Call for any changes  Melanocytic Nevi - Tan-brown and/or pink-flesh-colored symmetric macules and papules - Benign appearing on exam today - Observation - Call clinic for new or changing moles - Recommend daily use of broad spectrum spf 30+ sunscreen to sun-exposed areas.   Hemangiomas - Red papules - Discussed benign nature - Observe - Call for any changes  Actinic Damage - Chronic condition, secondary to cumulative UV/sun exposure - diffuse scaly erythematous macules with underlying dyspigmentation - Recommend daily broad spectrum sunscreen SPF 30+ to sun-exposed areas, reapply every 2 hours as needed.  - Staying in the shade or wearing long sleeves, sun glasses (UVA+UVB protection) and wide brim hats (4-inch brim around the entire circumference of the hat) are also recommended for sun protection.  - Call for new or changing lesions.  Skin cancer screening performed today.   History of melanoma Right Forearm  Clear. Observe for recurrence. Call clinic for new or changing lesions.  Recommend regular skin exams, daily broad-spectrum spf 30+ sunscreen use, and photoprotection.     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 Observe for recurrence. Call clinic for new or changing lesions.  Recommend regular skin exams, daily broad-spectrum spf 30+ sunscreen use, and photoprotection.     History of basal cell carcinoma (BCC) L forehead  Clear. Observe for recurrence. Call clinic for new or changing lesions.  Recommend regular skin exams, daily broad-spectrum spf 30+ sunscreen use, and photoprotection.     Nummular dermatitis bil pretibial  Start TMC 0.1% cr qd/bid until clear then prn flares, avoid f/g/a Recommend mild soap and moisturizing cream 1-2 times daily.  Gentle skin care handout provided.    Topical steroids (such as triamcinolone, fluocinolone, fluocinonide, mometasone,  clobetasol, halobetasol, betamethasone, hydrocortisone) can cause thinning and lightening of the skin if they are used for too long in the same area. Your physician has selected the right strength medicine for your problem and area affected on the body. Please use your medication only as directed by your physician to prevent side effects.    triamcinolone cream (KENALOG) 0.1 % - bil pretibial  Nevus (2) Left medial buttock; Right Lower Back  Benign-appearing.  Observation.  Call clinic for new or changing moles.  Recommend daily use of broad spectrum spf 30+ sunscreen to sun-exposed areas.    Rosacea Head - Anterior (Face)  Rosacea is a chronic progressive skin condition usually affecting the face of adults, causing redness and/or acne bumps. It is treatable but not curable. It sometimes affects the eyes (ocular rosacea) as well. It may respond to topical and/or systemic medication and can flare with stress, sun exposure, alcohol, exercise and some foods.  Daily application of broad spectrum spf 30+ sunscreen to face is recommended to reduce flares.  Cont OTC Rosacea treatment which pt states is helping  Inflamed seborrheic keratosis L frontal scalp x 1  Pt will RTC if doesn't clear  Destruction of lesion - L frontal scalp x 1  Destruction method: cryotherapy   Informed consent: discussed and consent obtained   Lesion destroyed using liquid nitrogen: Yes   Region frozen until ice ball extended beyond lesion: Yes   Outcome: patient tolerated procedure well with no complications   Post-procedure details: wound care instructions given    Return in about 1 year (around 04/13/2022) for TBSE, Hx of Melanoma, Hx of BCC, Hx of SCC, recheck ISK L frontal scalp.  I, Othelia Pulling, RMA, am acting as scribe for Brendolyn Patty, MD . Documentation: I have reviewed the above documentation for accuracy and completeness, and I agree with the above.  Brendolyn Patty MD

## 2021-04-13 NOTE — Telephone Encounter (Signed)
Patient seen 03/31/21 .

## 2021-04-28 ENCOUNTER — Other Ambulatory Visit: Payer: Self-pay | Admitting: Internal Medicine

## 2021-05-15 DIAGNOSIS — N390 Urinary tract infection, site not specified: Secondary | ICD-10-CM | POA: Diagnosis not present

## 2021-05-15 DIAGNOSIS — R350 Frequency of micturition: Secondary | ICD-10-CM | POA: Diagnosis not present

## 2021-07-05 ENCOUNTER — Ambulatory Visit (INDEPENDENT_AMBULATORY_CARE_PROVIDER_SITE_OTHER): Payer: PPO

## 2021-07-05 ENCOUNTER — Other Ambulatory Visit: Payer: Self-pay

## 2021-07-05 ENCOUNTER — Encounter: Payer: Self-pay | Admitting: Emergency Medicine

## 2021-07-05 ENCOUNTER — Ambulatory Visit
Admission: EM | Admit: 2021-07-05 | Discharge: 2021-07-05 | Disposition: A | Discharge status: Home / Self Care | Payer: PPO | Attending: Family Medicine | Admitting: Family Medicine

## 2021-07-05 DIAGNOSIS — Z87891 Personal history of nicotine dependence: Secondary | ICD-10-CM | POA: Diagnosis not present

## 2021-07-05 DIAGNOSIS — R059 Cough, unspecified: Secondary | ICD-10-CM | POA: Diagnosis not present

## 2021-07-05 DIAGNOSIS — Z20822 Contact with and (suspected) exposure to covid-19: Secondary | ICD-10-CM | POA: Diagnosis not present

## 2021-07-05 DIAGNOSIS — Z79899 Other long term (current) drug therapy: Secondary | ICD-10-CM | POA: Diagnosis not present

## 2021-07-05 MED ORDER — BENZONATATE 200 MG PO CAPS: 200.0000 mg | ORAL_CAPSULE | Freq: Three times a day (TID) | ORAL | 0 refills | Status: DC | PRN

## 2021-07-05 NOTE — Discharge Instructions (Addendum)
Exam normal.  Chest xray normal as well.  Cough medication as directed.  Take care  Dr. Lacinda Axon

## 2021-07-05 NOTE — ED Triage Notes (Signed)
Patient c/o cough and chest congestion that started 3 days ago. Patient states that she did home covid test yesterday and was negative.  Patient denies fevers.

## 2021-07-05 NOTE — ED Provider Notes (Signed)
[facility]    CSN: [id number] Arrival date & time: 07/05/21  0836      History   Chief Complaint Chief Complaint  Patient presents with   Cough   HPI  65 year old female presents with cough.  3-day history of cough.  Patient states that she has a "croupy cough".  No fever.  No other respiratory symptoms.  Patient states that she will be going out of town in a few days and wanted to get examined.  No relieving factors.  No other associated symptoms.  No other complaints.  Past Medical History:  Diagnosis Date   Aortic atherosclerosis (HCC)    Arthritis    Back pain    Basal cell carcinoma    L forehead, txted in past by another provider   Collagen vascular disease ([place])    Depression    DOE (dyspnea on exertion)    a. 05/2020 Echo: EF 60-65%, no rwma, nl RV size/fxn.   History of stress test    a. 04/2020 Lexiscan MV: No ischemia/infarct.   Hyperlipemia    Hypertension    Melanoma ([place]) ~2015   R forearm txted in past by another provider   Recurrent UTI    Squamous cell carcinoma of skin    L med ankle, txted in past by another provider    Patient Active Problem List   Diagnosis Date Noted   Dyspnea on exertion 04/19/2020   Aortic atherosclerosis ([place]) 04/19/2020   Essential hypertension 04/19/2020   Hyperlipidemia 04/19/2020   Right carpal tunnel syndrome 11/09/2016    Past Surgical History:  Procedure Laterality Date   ABDOMINAL HYSTERECTOMY     COLONOSCOPY WITH PROPOFOL N/A 05/26/2020   Procedure: COLONOSCOPY WITH PROPOFOL;  Surgeon: Tol[place], Benay Pike, MD;  Location: [facility] ENDOSCOPY;  Service: Gastroenterology;  Laterality: N/A;   INCONTINENCE SURGERY     OOPHORECTOMY      OB History     Gravida  3   Para  3   Term  3   Preterm      AB      Living  3      SAB      IAB      Ectopic      Multiple      Live Births  3            Home Medications    Prior to Admission medications   Medication Sig Start Date End Date  Taking? Authorizing Provider  aspirin EC 81 MG tablet Take 1 tablet (81 mg total) by mouth daily. 04/13/21  Yes End, Harrell Gave, MD  atorvastatin (LIPITOR) 10 MG tablet TAKE ONE TABLET BY MOUTH DAILY 02/13/18  Yes [provider]  benzonatate (TESSALON) 200 MG capsule Take 1 capsule (200 mg total) by mouth 3 (three) times daily as needed for cough. 07/05/21  Yes Gjon Letarte G, DO  buPROPion (WELLBUTRIN XL) 300 MG 24 hr tablet TAKE ONE TABLET BY MOUTH DAILY 02/13/18  Yes [provider]  lisinopril (ZESTRIL) 20 MG tablet Take 1 tablet by mouth daily 04/28/21  Yes End, Harrell Gave, MD  trimethoprim (TRIMPEX) 100 MG tablet Take 1 tablet (100 mg total) by mouth daily. 07/29/20  Yes MacDiarmid, Nicki Reaper, MD  Calcium Carbonate-Vitamin D 600-400 MG-UNIT tablet Take by mouth.    [provider]  Omega-3 Fatty Acids (FISH OIL) 1000 MG CAPS Take 1 capsule by mouth daily.    [provider]  triamcinolone cream (KENALOG) 0.1 % Apply  1 application topically as directed. Qd to bid aa itchy rash on legs until clear then prn flares, avoid face, groin, axilla 04/13/21   Brendolyn Patty, MD    Family History Family History  Problem Relation Age of Onset   Cancer Father        laryngeal   Atrial fibrillation Father    Stroke Mother    Breast cancer Neg Hx    Bladder Cancer Neg Hx    Kidney cancer Neg Hx     Social History Social History   Tobacco Use   Smoking status: Former    Packs/day: 1.00    Years: 30.00    Pack years: 30.00    Types: Cigarettes    Quit date: 10/04/1987    Years since quitting: 33.7   Smokeless tobacco: Never  Vaping Use   Vaping Use: Never used  Substance Use Topics   Alcohol use: Yes    Comment: occasional; once glass of wine per month   Drug use: Not Currently     Allergies   Patient has no known allergies.   Review of Systems Review of Systems  Constitutional:  Negative for fever.  Respiratory:  Positive for cough. Negative for  shortness of breath.    Physical Exam Triage Vital Signs ED Triage Vitals  Enc Vitals Group     BP 07/05/21 0855 136/73     Pulse Rate 07/05/21 0855 63     Resp 07/05/21 0855 14     Temp 07/05/21 0855 98.4 F (36.9 C)     Temp Source 07/05/21 0855 Oral     SpO2 07/05/21 0855 98 %     Weight 07/05/21 0850 189 lb (85.7 kg)     Height 07/05/21 0850 5\' 6"  (1.676 m)     Head Circumference --      Peak Flow --      Pain Score 07/05/21 0850 0     Pain Loc --      Pain Edu? --      Excl. in [place]? --    Updated Vital Signs BP 136/73 (BP Location: Left Arm)   Pulse 63   Temp 98.4 F (36.9 C) (Oral)   Resp 14   Ht 5\' 6"  (1.676 m)   Wt 85.7 kg   SpO2 98%   BMI 30.51 kg/m   Visual Acuity Right Eye Distance:   Left Eye Distance:   Bilateral Distance:    Right Eye Near:   Left Eye Near:    Bilateral Near:     Physical Exam Vitals and nursing note reviewed.  Constitutional:      General: She is not in acute distress.    Appearance: Normal appearance.  HENT:     Head: Normocephalic and atraumatic.  Eyes:     General:        Right eye: No discharge.        Left eye: No discharge.     Conjunctiva/sclera: Conjunctivae normal.  Cardiovascular:     Rate and Rhythm: Normal rate and regular rhythm.     Heart sounds: No murmur heard. Pulmonary:     Effort: Pulmonary effort is normal.     Breath sounds: No wheezing, rhonchi or rales.  Neurological:     Mental Status: She is alert.  Psychiatric:        Mood and Affect: Mood normal.        Behavior: Behavior normal.    UC Treatments / Results  Labs (all  labs ordered are listed, but only abnormal results are displayed) Labs Reviewed  SARS CORONAVIRUS 2 (TAT 6-24 HRS)    EKG   Radiology DG Chest 2 View  Result Date: 07/05/2021 CLINICAL DATA:  Cough, congestion EXAM: CHEST - 2 VIEW COMPARISON:  None. FINDINGS: The heart size and mediastinal contours are within normal limits. No focal airspace consolidation, pleural  effusion, or pneumothorax. Degenerative changes within the thoracic spine and bilateral shoulders. IMPRESSION: No active cardiopulmonary disease. Electronically Signed   By: Davina Poke D.O.   On: 07/05/2021 09:29    Procedures Procedures (including critical care time)  Medications Ordered in UC Medications - No data to display  Initial Impression / Assessment and Plan / UC Course  I have reviewed the triage vital signs and the nursing notes.  Pertinent labs & imaging results that were available during my care of the patient were reviewed by me and considered in my medical decision making (see chart for details).    65 year old female presents with cough.  Chest x-ray was obtained and was independently reviewed by me.  Interpretation: Normal chest x-ray.  Tessalon Perles for cough.  Supportive care.  Final Clinical Impressions(s) / UC Diagnoses   Final diagnoses:  Cough     Discharge Instructions      Exam normal.  Chest xray normal as well.  Cough medication as directed.  Take care  Dr. Lacinda Axon      ED Prescriptions     Medication Sig Dispense Auth. Provider   benzonatate (TESSALON) 200 MG capsule Take 1 capsule (200 mg total) by mouth 3 (three) times daily as needed for cough. 30 capsule Coral Spikes, DO      PDMP not reviewed this encounter.   Thersa Salt [place], [place] 07/05/21 [phone]

## 2021-07-06 LAB — SARS CORONAVIRUS 2 (TAT 6-24 HRS): SARS Coronavirus 2: NEGATIVE

## 2021-07-31 ENCOUNTER — Telehealth: Payer: PPO

## 2021-07-31 DIAGNOSIS — N390 Urinary tract infection, site not specified: Secondary | ICD-10-CM

## 2021-07-31 DIAGNOSIS — N3946 Mixed incontinence: Secondary | ICD-10-CM

## 2021-07-31 MED ORDER — TRIMETHOPRIM 100 MG PO TABS: 100.0000 mg | ORAL_TABLET | Freq: Every day | ORAL | 3 refills | Status: DC

## 2021-07-31 NOTE — Telephone Encounter (Signed)
Medication refilled

## 2021-08-17 ENCOUNTER — Ambulatory Visit: Payer: PPO | Admitting: Urology

## 2021-08-17 ENCOUNTER — Other Ambulatory Visit: Payer: Self-pay

## 2021-08-17 ENCOUNTER — Encounter: Payer: Self-pay | Admitting: Urology

## 2021-08-17 VITALS — BP 131/86 | HR 64 | Ht 66.0 in | Wt 190.0 lb

## 2021-08-17 DIAGNOSIS — N3946 Mixed incontinence: Secondary | ICD-10-CM

## 2021-08-17 DIAGNOSIS — N3020 Other chronic cystitis without hematuria: Secondary | ICD-10-CM

## 2021-08-17 DIAGNOSIS — N390 Urinary tract infection, site not specified: Secondary | ICD-10-CM

## 2021-08-17 MED ORDER — TRIMETHOPRIM 100 MG PO TABS: 100.0000 mg | ORAL_TABLET | Freq: Every day | ORAL | 3 refills | Status: DC

## 2021-08-17 NOTE — Progress Notes (Signed)
08/17/2021 9:38 AM   Michelle Cisneros 13-Jul-1956 [id number]  Referring provider: Maryland Pink, MD [address] [place],  [place] [address]  Chief Complaint  Patient presents with   Follow-up    HPI: Was consulted to assess the patient recurrent bladder infections. She has had a previous mesh sling.     At baseline she leaks with coughing sneezing bending and lifting.  She has urge incontinence.  She denies bedwetting.  She wears 4- 5 pads a day sometimes damp but sometimes very wet especially if walking or more active.  She voids once at night or less and every 2 hours during the day   The patient was referred for recurrent urinary tract infections and what appears to be microscopic hematuria.  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     CT scan within normal limits.  She might have a mild right ureteropelvic junction issue or just an extrarenal pelvis.  Contrast excretion and uptake was similar in both kidneys   Pelvic examination: Small grade 2 cystocele with mild central defect and no rectocele.  Grade 1 hypermobility of the bladder neck and no stress incontinence with a light cough.  Moderate vaginal atrophy   Cystoscopy: normal   I believe the patient has not had a bladder infection but she only took it for 1 month because I think she thought it was going to help her incontinence.  She has had at least one bladder operation.  We talked about incontinence and bladder overactivity and stress incontinence.  She is somewhat willing to live with the problem and does not want to go through a lot of cost and tests but then again does not necessarily like to live with the problem   The trimethoprim may have made the incontinence or flow worse and I educated her about this    Patient saw nurse practitioner and failed Myrbetriq.  Chose conservative therapy and stayed on daily trimethoprim.   Mild incontinence stable.  Watchful waiting  chosen Infection free on daily trimethoprim.  90x3 sent.   Today   Incontinence stable and she chose watchful waiting.  1 breakthrough infection on trimethoprim many months ago.  Clinically not infected today.  Frequency stable.  PMH: Past Medical History:  Diagnosis Date   Aortic atherosclerosis (HCC)    Arthritis    Back pain    Basal cell carcinoma    L forehead, txted in past by another provider   Collagen vascular disease ([place])    Depression    DOE (dyspnea on exertion)    a. 05/2020 Echo: EF 60-65%, no rwma, nl RV size/fxn.   History of stress test    a. 04/2020 Lexiscan MV: No ischemia/infarct.   Hyperlipemia    Hypertension    Melanoma ([place]) ~2015   R forearm txted in past by another provider   Recurrent UTI    Squamous cell carcinoma of skin    L med ankle, txted in past by another provider    Surgical History: Past Surgical History:  Procedure Laterality Date   ABDOMINAL HYSTERECTOMY     COLONOSCOPY WITH PROPOFOL N/A 05/26/2020   Procedure: COLONOSCOPY WITH PROPOFOL;  Surgeon: Toledo, Benay Pike, MD;  Location: [facility] ENDOSCOPY;  Service: Gastroenterology;  Laterality: N/A;   INCONTINENCE SURGERY     OOPHORECTOMY      Home Medications:  Allergies as of 08/17/2021   No Known Allergies      Medication List  Accurate as of August 17, 2021  9:38 AM. If you have any questions, ask your nurse or doctor.          STOP taking these medications    benzonatate 200 MG capsule Commonly known as: TESSALON Stopped by: Reece Packer, MD       TAKE these medications    aspirin EC 81 MG tablet Take 1 tablet (81 mg total) by mouth daily.   atorvastatin 10 MG tablet Commonly known as: LIPITOR TAKE ONE TABLET BY MOUTH DAILY   buPROPion 300 MG 24 hr tablet Commonly known as: WELLBUTRIN XL TAKE ONE TABLET BY MOUTH DAILY   Calcium Carbonate-Vitamin D 600-400 MG-UNIT tablet Take by mouth.   Fish Oil 1000 MG Caps Take 1 capsule by mouth daily.    lisinopril 20 MG tablet Commonly known as: ZESTRIL Take 1 tablet by mouth daily   triamcinolone cream 0.1 % Commonly known as: KENALOG Apply 1 application topically as directed. Qd to bid aa itchy rash on legs until clear then prn flares, avoid face, groin, axilla   trimethoprim 100 MG tablet Commonly known as: TRIMPEX Take 1 tablet (100 mg total) by mouth daily.        Allergies: No Known Allergies  Family History: Family History  Problem Relation Age of Onset   Cancer Father        laryngeal   Atrial fibrillation Father    Stroke Mother    Breast cancer Neg Hx    Bladder Cancer Neg Hx    Kidney cancer Neg Hx     Social History:  reports that she quit smoking about 33 years ago. Her smoking use included cigarettes. She has a 30.00 pack-year smoking history. She has never used smokeless tobacco. She reports current alcohol use. She reports that she does not currently use drugs.  ROS:                                        Physical Exam: There were no vitals taken for this visit.  Constitutional:  Alert and oriented, No acute distress. HEENT: [place] AT, moist mucus membranes.  Trachea midline, no masses.   Laboratory Data: No results found for: WBC, HGB, HCT, MCV, PLT  Lab Results  Component Value Date   CREATININE 0.86 05/22/2020    No results found for: PSA  No results found for: TESTOSTERONE  No results found for: HGBA1C  Urinalysis    Component Value Date/Time   COLORURINE YELLOW (A) 11/22/2016 2101   APPEARANCEUR Hazy (A) 08/10/2019 0914   LABSPEC 1.016 11/22/2016 2101   PHURINE 6.0 11/22/2016 2101   GLUCOSEU Negative 08/10/2019 0914   HGBUR 3+ (A) 11/22/2016 2101   BILIRUBINUR Negative 08/10/2019 0914   KETONESUR NEGATIVE 11/22/2016 2101   PROTEINUR Negative 08/10/2019 0914   PROTEINUR 100 (A) 11/22/2016 2101   NITRITE Negative 08/10/2019 0914   NITRITE NEGATIVE 11/22/2016 2101   LEUKOCYTESUR Negative 08/10/2019 0914     Pertinent Imaging:   Assessment & Plan: trimethoprim 100 mg 90x3 sent to pharmacy and I will see in 1 year  There are no diagnoses linked to this encounter.  No follow-ups on file.  Reece Packer, MD  [facility] [address] [place],  [address] [phone]

## 2021-09-15 ENCOUNTER — Ambulatory Visit: Payer: PPO | Admitting: Dermatology

## 2021-09-15 ENCOUNTER — Other Ambulatory Visit: Payer: Self-pay

## 2021-09-15 DIAGNOSIS — Z85820 Personal history of malignant melanoma of skin: Secondary | ICD-10-CM | POA: Diagnosis not present

## 2021-09-15 DIAGNOSIS — L814 Other melanin hyperpigmentation: Secondary | ICD-10-CM | POA: Diagnosis not present

## 2021-09-15 DIAGNOSIS — L820 Inflamed seborrheic keratosis: Secondary | ICD-10-CM

## 2021-09-15 DIAGNOSIS — L821 Other seborrheic keratosis: Secondary | ICD-10-CM

## 2021-09-15 DIAGNOSIS — D1800 Hemangioma unspecified site: Secondary | ICD-10-CM

## 2021-09-15 NOTE — Progress Notes (Addendum)
   Follow-Up Visit   Subjective  Michelle Cisneros is a 65 y.o. female who presents for the following: Spots (Right lower leg, noticed 3-4 months ago, no symptoms. Spot on her left flank at bra line, itchy and irritated by bra. ). Spot on R leg is getting smaller.   The following portions of the chart were reviewed this encounter and updated as appropriate:       Review of Systems:  No other skin or systemic complaints except as noted in HPI or Assessment and Plan.  Objective  Well appearing patient in no apparent distress; mood and affect are within normal limits.  A focused examination was performed including legs, trunk. Relevant physical exam findings are noted in the Assessment and Plan.  Left Flank at braline x 1, R mid pretibia 6.41mm waxy light tan macule with some heme crusting- R pretibia  Waxy tan stuck on papule with erythema- L flank   Assessment & Plan   Inflamed seborrheic keratosis R mid pretibia; Left Flank at braline x 1  R pretibia growth not bothersome to patient. Defers cryotherapy treatment today. Will observe.  Call if changes    Destruction of lesion - Left Flank at braline x 1  Destruction method: cryotherapy   Informed consent: discussed and consent obtained   Lesion destroyed using liquid nitrogen: Yes   Region frozen until ice ball extended beyond lesion: Yes   Outcome: patient tolerated procedure well with no complications   Post-procedure details: wound care instructions given   Additional details:  Prior to procedure, discussed risks of blister formation, small wound, skin dyspigmentation, or rare scar following cryotherapy. Recommend Vaseline ointment to treated areas while healing.    Seborrheic Keratoses - Stuck-on, waxy, tan-brown papules and/or plaques on the legs - Benign-appearing - Discussed benign etiology and prognosis. - Observe - Call for any changes  Hemangiomas - Red papules - Discussed benign nature - Observe - Call  for any changes  Lentigines - Scattered tan macules - Due to sun exposure - Benign-appering, observe - Recommend daily broad spectrum sunscreen SPF 30+ to sun-exposed areas, reapply every 2 hours as needed. - Call for any changes  History of Melanoma 2015 - No evidence of recurrence today of the right forearm - Recommend regular full body skin exams - Recommend daily broad spectrum sunscreen SPF 30+ to sun-exposed areas, reapply every 2 hours as needed.  - Call if any new or changing lesions are noted between office visits   Return as scheduled.  IJamesetta Orleans, CMA, am acting as scribe for Brendolyn Patty, MD .  Documentation: I have reviewed the above documentation for accuracy and completeness, and I agree with the above.  Brendolyn Patty MD

## 2021-09-15 NOTE — Patient Instructions (Addendum)

## 2021-09-25 ENCOUNTER — Telehealth: Payer: Self-pay | Admitting: Internal Medicine

## 2021-09-25 NOTE — Telephone Encounter (Signed)
Patient states she purchased a monitor from the drug store and has had 2 reading of afib. States she had an episode that she experienced a "heartbeat in her ear" , she then took her BP. BP was 158/91 HR 73, this was October 6. October 5 134/84 HR 76 states she also felt this day her heartbeat in her ear.  Both of these episodes she was not active, she was just relaxing.  Patient states she had no other symptoms. No headache, dizziness, SOB, chest pain. States these episodes only lasted for about 5 minutes.  Please call to discuss.

## 2021-09-25 NOTE — Telephone Encounter (Signed)
Spoke to pt.  Pt purchased Kardia mobile device recently that has confirmed 2 episodes of afib since purchase.  Pt "feels heart beat in ear" during both episodes.  During each episode BP/HR: 158/91 73, 134/84  76. Denies shortness of breath, dizziness, chest pain or any other associated symptoms recently, however, has had other symptoms in the past intermittently.  Last seen 03/31/21 with Ignacia Bayley, NP for left rib pain, fluttering in chest/palpitations, cough, nausea.  No a fib noted on any EKG that pt has had in office.  Father does have h/o a fib.  Event monitor discussed at visit. Pt decided to purchase Kardia mobile to monitor after visit, now with confirmation of a fib.  Pt also due for 6 month follow up.   Scheduled pt to see Dr. Saunders Revel 10/02/21.  Advised pt to continue to monitor BP/HR and symptoms and to let us know of any changes prior to visit.  If develop SOB, chest pain, dizziness, becomes unstable, HR >100 in afib to go to the emergency room.  Otherwise will see pt at visit next week.  Pt appreciative, voiced understanding and has no further questions at this time.

## 2021-09-26 NOTE — Telephone Encounter (Signed)
Please have Ms. Spellman bring her Michelle Cisneros device and tracings with her to the office visit.  Otherwise, I don't think there is anything that needs to be done in the meantime.  Thanks.  Nelva Bush, MD [facility]

## 2021-09-28 NOTE — Telephone Encounter (Signed)
Spoke with pt and notified of Dr. Darnelle Bos recc below.  Pt voiced understanding. She will bring her Michelle Cisneros device with tracings to ov 10/14.  Pt has no further questions at this time.

## 2021-10-02 ENCOUNTER — Ambulatory Visit: Payer: PPO | Admitting: Internal Medicine

## 2021-10-02 ENCOUNTER — Ambulatory Visit (INDEPENDENT_AMBULATORY_CARE_PROVIDER_SITE_OTHER): Payer: PPO

## 2021-10-02 ENCOUNTER — Other Ambulatory Visit: Payer: Self-pay

## 2021-10-02 ENCOUNTER — Encounter: Payer: Self-pay | Admitting: Internal Medicine

## 2021-10-02 VITALS — BP 130/90 | HR 72 | Ht 66.0 in | Wt 190.0 lb

## 2021-10-02 DIAGNOSIS — I10 Essential (primary) hypertension: Secondary | ICD-10-CM

## 2021-10-02 DIAGNOSIS — R002 Palpitations: Secondary | ICD-10-CM | POA: Diagnosis not present

## 2021-10-02 NOTE — Progress Notes (Signed)
Follow-up Outpatient Visit Date: 10/02/2021  Primary Care Provider: Maryland Pink, MD [place] [id number]  Chief Complaint: Possible atrial fibrillation  HPI:  Michelle Cisneros is a 65 y.o. female with history of aortic atherosclerosis, HTN, hyperlipidemia, chronic back pain following a fracture, thyroid nodule, and skin cancer, who presents for evaluation of suspected paroxysmal atrial fibrillation.  She was last seen in our office in April by Ignacia Bayley, NP, at which time she complained of left-sided rib pain radiating to the right shoulder as well as palpitations, cough, and nausea dilatory event monitoring was discussed but deferred due to infrequent nature of palpitations.  In the meantime, she has procured a Kardia device and reached out to [place] last week reporting episodes of paroxysmal atrial fibrillation.  Today, Michelle Cisneros reports that she has been feeling fairly well though she occasionally senses a heart beat or other pounding in her ears.  At other times she notices flutters in her chest.  During some of these episodes, she has taken rhythm strips on her Kardia mobile device.  Some have indicated possible atrial fibrillation.  The rib pain that she complained about at her prior visit has resolved.  She otherwise has not had any chest pain nor shortness of breath, lightheadedness, or edema.  She reports being fairly sedentary.  Home blood pressure is usually less than 130/80.  --------------------------------------------------------------------------------------------------  Cardiovascular History & Procedures: Cardiovascular Problems: Atherosclerosis   Risk Factors: Atherosclerosis, hypertension, age greater than 28, and prior tobacco use   Cath/PCI: None   CV Surgery: None   EP Procedures and Devices: None   Non-Invasive Evaluation(s): TTE (6/3/20201): Technically difficult study.  LVEF 60-65% with normal diastolic function.  Normal  RV size and function.  No significant valvular abnormality. Pharmacologic MPI (05/05/2020): Normal study without ischemia or scar.  LVEF 55-65%.  Recent CV Pertinent Labs: Lab Results  Component Value Date   K 4.2 05/22/2020   BUN 17 05/22/2020   CREATININE 0.86 05/22/2020    Past medical and surgical history were reviewed and updated in EPIC.  Current Meds  Medication Sig   aspirin EC 81 MG tablet Take 1 tablet (81 mg total) by mouth daily.   atorvastatin (LIPITOR) 10 MG tablet TAKE ONE TABLET BY MOUTH DAILY   buPROPion (WELLBUTRIN XL) 300 MG 24 hr tablet TAKE ONE TABLET BY MOUTH DAILY   Calcium Carbonate-Vitamin D 600-400 MG-UNIT tablet Take 1 tablet by mouth daily.   lisinopril (ZESTRIL) 20 MG tablet Take 1 tablet by mouth daily   trimethoprim (TRIMPEX) 100 MG tablet Take 1 tablet (100 mg total) by mouth daily.    Allergies: Patient has no known allergies.  Social History   Tobacco Use   Smoking status: Former    Packs/day: 1.00    Years: 30.00    Pack years: 30.00    Types: Cigarettes    Quit date: 10/04/1987    Years since quitting: 34.0   Smokeless tobacco: Never  Vaping Use   Vaping Use: Never used  Substance Use Topics   Alcohol use: Yes    Comment: occasional; once glass of wine   Drug use: Not Currently    Family History  Problem Relation Age of Onset   Cancer Father        laryngeal   Atrial fibrillation Father    Stroke Mother    Breast cancer Neg Hx    Bladder Cancer Neg Hx    Kidney cancer Neg  Hx     Review of Systems: A 12-system review of systems was performed and was negative except as noted in the HPI.  --------------------------------------------------------------------------------------------------  Physical Exam: BP 130/90 (BP Location: Left Arm, Patient Position: Sitting, Cuff Size: Large)   Pulse 72   Ht 5\' 6"  (1.676 m)   Wt 190 lb (86.2 kg)   SpO2 96%   BMI 30.67 kg/m   General:  NAD. Neck: No JVD or HJR. Lungs: Clear to  auscultation bilaterally without wheezes or crackles. Heart: Regular rate and rhythm without murmurs, rubs, or gallops. Abdomen: Soft, nontender, nondistended. Extremities: No lower extremity edema.  EKG: Normal sinus rhythm without abnormality.  Cardiac tracings personally reviewed today.  Some show an irregular rhythm that could represent atrial fibrillation, though some beats appear to be preceded by P waves raising the possibility of sinus rhythm with frequent PACs or wandering atrial pacemaker.  Lab Results  Component Value Date   NA 139 05/22/2020   K 4.2 05/22/2020   CL 103 05/22/2020   CO2 26 05/22/2020   BUN 17 05/22/2020   CREATININE 0.86 05/22/2020   GLUCOSE 115 (H) 05/22/2020    --------------------------------------------------------------------------------------------------  ASSESSMENT AND PLAN: Palpitations and rhythm strips with possible atrial fibrillation: Symptoms have been fairly sporadic though rhythm strips do show some irregularity.  This could represent atrial fibrillation though I am also suspicious for possibilities of frequent PACs versus wandering atrial pacemaker.  As a diagnosis of atrial fibrillation would carry with it potential for lifelong anticoagulation, I think it would be best to confirm this diagnosis with formal ambulatory cardiac monitoring.  We have agreed to obtain a 14-day ZIO XT monitor.  We will defer medication changes today.  If monitor confirms atrial fibrillation, anticoagulation would be indicated in the setting of a CHA2DS2-VASc score of at least 3 (age, gender, and hypertension).  Hypertension: Blood pressure borderline today but typically better at home.  Defer medication changes at this time.  Follow-up: Return to clinic in 6 weeks to review monitor results.  Nelva Bush, MD 10/02/2021 10:49 AM

## 2021-10-02 NOTE — Patient Instructions (Signed)
Medication Instructions:  - Your physician recommends that you continue on your current medications as directed. Please refer to the Current Medication list given to you today.  *If you need a refill on your cardiac medications before your next appointment, please call your pharmacy*   Lab Work: - none ordered  If you have labs (blood work) drawn today and your tests are completely normal, you will receive your results only by: [place] (if you have MyChart) OR A paper copy in the mail If you have any lab test that is abnormal or we need to change your treatment, we will call you to review the results.   Testing/Procedures: - Your physician has recommended that you wear a Zio XT (heart) monitor x 14 days  Placed in office today Dates of wear: 10/02/21-10/16/21  This monitor is a medical device that records the heart's electrical activity. Doctors most often use these monitors to diagnose arrhythmias. Arrhythmias are problems with the speed or rhythm of the heartbeat. The monitor is a small device applied to your chest. You can wear one while you do your normal daily activities. While wearing this monitor if you have any symptoms to push the button and record what you felt. Once you have worn this monitor for the period of time provider prescribed (Usually 14 days), you will return the monitor device in the postage paid box. Once it is returned they will download the data collected and provide [place] with a report which the provider will then review and we will call you with those results. Important tips:  Avoid showering during the first 24 hours of wearing the monitor. Avoid excessive sweating to help maximize wear time. Do not submerge the device, no hot tubs, and no swimming pools. Keep any lotions or oils away from the patch. After 24 hours you may shower with the patch on. Take brief showers with your back facing the shower head.  Do not remove patch once it has been placed  because that will interrupt data and decrease adhesive wear time. Push the button when you have any symptoms and write down what you were feeling. Once you have completed wearing your monitor, remove and place into box which has postage paid and place in your outgoing mailbox.  If for some reason you have misplaced your box then call our office and we can provide another box and/or mail it off for you.      Follow-Up: At [facility], you and your health needs are our priority.  As part of our continuing mission to provide you with exceptional heart care, we have created designated Provider Care Teams.  These Care Teams include your primary Cardiologist (physician) and Advanced Practice Providers (APPs -  Physician Assistants and Nurse Practitioners) who all work together to provide you with the care you need, when you need it.  We recommend signing up for the patient portal called "MyChart".  Sign up information is provided on this After Visit Summary.  MyChart is used to connect with patients for Virtual Visits (Telemedicine).  Patients are able to view lab/test results, encounter notes, upcoming appointments, etc.  Non-urgent messages can be sent to your provider as well.   To learn more about what you can do with MyChart, go to [email or web address].    Your next appointment:   6 week(s)  The format for your next appointment:   In Person  Provider:   You may see Nelva Bush, MD or one of the following Advanced  Practice Providers on your designated Care Team:   Murray Hodgkins, NP Christell Faith, PA-C Marrianne Mood, PA-C Cadence Kathlen Mody, [place]   Other Instructions N/a

## 2021-10-03 ENCOUNTER — Encounter: Payer: Self-pay | Admitting: Internal Medicine

## 2021-10-03 DIAGNOSIS — R002 Palpitations: Secondary | ICD-10-CM | POA: Insufficient documentation

## 2021-10-14 ENCOUNTER — Other Ambulatory Visit: Payer: Self-pay

## 2021-10-14 MED ORDER — LISINOPRIL 20 MG PO TABS: 20.0000 mg | ORAL_TABLET | Freq: Every day | ORAL | 0 refills | Status: DC

## 2021-10-16 DIAGNOSIS — R002 Palpitations: Secondary | ICD-10-CM | POA: Diagnosis not present

## 2021-10-23 DIAGNOSIS — R002 Palpitations: Secondary | ICD-10-CM | POA: Diagnosis not present

## 2021-10-27 ENCOUNTER — Telehealth: Payer: Self-pay | Admitting: *Deleted

## 2021-10-27 NOTE — Telephone Encounter (Signed)
Attempted to call pt. No answer. Lmtcb.  

## 2021-10-27 NOTE — Telephone Encounter (Signed)
Patient calling to discuss recent testing results  ° °Please call  ° °

## 2021-10-27 NOTE — Telephone Encounter (Signed)
Spoke with pt. Notified of results and Dr. Darnelle Bos recc.  Pt voiced understanding.  Pt states she would like to wait until her follow up with Dr. Saunders Revel 11/19/21 to discuss starting Metoprolol.  Pt is aware if she would like to start trial prior to follow up she may call back to let us know.  Otherwise, pt has no further questions at this time.

## 2021-10-27 NOTE — Telephone Encounter (Signed)
-----   Message from Nelva Bush, MD sent at 10/26/2021  8:51 AM EST ----- Please let Ms. Ortwein know that the event monitor showed numerous episodes of extra beats and transient elevations in her heart rate consistent with paroxysmal supraventricular tachycardia.  There was no evidence of atrial fibrillation.  If she is bothered by the palpitations, I would recommend trial of a beta-blocker like metoprolol succinate 25 mg daily.  We can discuss this further at her follow-up visit if she prefers to wait until then.  There is no indication for starting anticoagulation at this time.

## 2021-11-06 ENCOUNTER — Ambulatory Visit: Payer: PPO | Admitting: Internal Medicine

## 2021-11-09 DIAGNOSIS — E042 Nontoxic multinodular goiter: Secondary | ICD-10-CM | POA: Diagnosis not present

## 2021-11-19 ENCOUNTER — Other Ambulatory Visit: Payer: Self-pay

## 2021-11-19 ENCOUNTER — Encounter: Payer: Self-pay | Admitting: Internal Medicine

## 2021-11-19 ENCOUNTER — Ambulatory Visit: Payer: PPO | Admitting: Internal Medicine

## 2021-11-19 VITALS — BP 110/84 | HR 70 | Ht 66.0 in | Wt 194.0 lb

## 2021-11-19 DIAGNOSIS — I10 Essential (primary) hypertension: Secondary | ICD-10-CM

## 2021-11-19 DIAGNOSIS — G4700 Insomnia, unspecified: Secondary | ICD-10-CM

## 2021-11-19 DIAGNOSIS — I471 Supraventricular tachycardia: Secondary | ICD-10-CM | POA: Diagnosis not present

## 2021-11-19 DIAGNOSIS — R5383 Other fatigue: Secondary | ICD-10-CM

## 2021-11-19 NOTE — Progress Notes (Signed)
Follow-up Outpatient Visit Date: 11/19/2021  Primary Care Provider: Maryland Pink, MD [place] [id number]  Chief Complaint: Follow-up palpitations  HPI:  Michelle Cisneros is a 65 y.o. female with history of aortic atherosclerosis, hypertension, hyperlipidemia, chronic back pain following fracture, thyroid nodule, and skin cancer, who presents for follow-up of palpitations and concerns for paroxysmal atrial fibrillation.  I last saw her in mid October, at which time she noted occasional palpitations.  Tracings from her Kardia device at home were concerning for atrial fibrillation.  Subsequent 14-day event monitor showed predominantly sinus rhythm with multiple runs of PSVT but no atrial fibrillation.  Today, Michelle Cisneros reports that she has been feeling fairly well and has been much less aware of palpitations.  She thinks that less stress is contributing to this.  She remains fairly immobile due to her chronic back pain.  She also complains of not sleeping well, sometimes going a few days without good sleep.  She often feels tired during the day and sometimes finds herself napping in the evenings.  She does not snore.  She has never been evaluated for sleep apnea.  She denies chest pain, shortness of breath, lightheadedness, and edema.  --------------------------------------------------------------------------------------------------  Cardiovascular History & Procedures: Cardiovascular Problems: Atherosclerosis PSVT   Risk Factors: Atherosclerosis, hypertension, age greater than 25, and prior tobacco use   Cath/PCI: None   CV Surgery: None   EP Procedures and Devices: 14-day event monitor (10/02/2021): Predominantly sinus rhythm with multiple runs of PSVT (lasting up to 33 seconds) as well as occasional PACs and rare PVCs.   Non-Invasive Evaluation(s): TTE (6/3/20201): Technically difficult study.  LVEF 60-65% with normal diastolic function.  Normal  RV size and function.  No significant valvular abnormality. Pharmacologic MPI (05/05/2020): Normal study without ischemia or scar.  LVEF 55-65%.  Recent CV Pertinent Labs: Lab Results  Component Value Date   K 4.2 05/22/2020   BUN 17 05/22/2020   CREATININE 0.86 05/22/2020    Past medical and surgical history were reviewed and updated in EPIC.  Current Meds  Medication Sig   aspirin EC 81 MG tablet Take 1 tablet (81 mg total) by mouth daily.   atorvastatin (LIPITOR) 10 MG tablet TAKE ONE TABLET BY MOUTH DAILY   buPROPion (WELLBUTRIN XL) 300 MG 24 hr tablet TAKE ONE TABLET BY MOUTH DAILY   Calcium Carbonate-Vitamin D 600-400 MG-UNIT tablet Take 1 tablet by mouth daily.   fluticasone (FLONASE) 50 MCG/ACT nasal spray Place 2 sprays into both nostrils daily.   lisinopril (ZESTRIL) 20 MG tablet Take 1 tablet (20 mg total) by mouth daily.   trimethoprim (TRIMPEX) 100 MG tablet Take 1 tablet (100 mg total) by mouth daily.    Allergies: Patient has no known allergies.  Social History   Tobacco Use   Smoking status: Former    Packs/day: 1.00    Years: 30.00    Pack years: 30.00    Types: Cigarettes    Quit date: 10/04/1987    Years since quitting: 34.1   Smokeless tobacco: Never  Vaping Use   Vaping Use: Never used  Substance Use Topics   Alcohol use: Yes    Comment: occassional; once glass of wine   Drug use: Not Currently    Family History  Problem Relation Age of Onset   Cancer Father        laryngeal   Atrial fibrillation Father    Stroke Mother    Breast cancer Neg  Hx    Bladder Cancer Neg Hx    Kidney cancer Neg Hx     Review of Systems: A 12-system review of systems was performed and was negative except as noted in the HPI.  --------------------------------------------------------------------------------------------------  Physical Exam: BP 110/84 (BP Location: Left Arm, Patient Position: Sitting, Cuff Size: Normal)   Pulse 70   Ht 5\' 6"  (1.676 m)   Wt  194 lb (88 kg)   SpO2 97%   BMI 31.31 kg/m   General:  NAD.  Accompanied by her daughter. Neck: No JVD or HJR.  Circumference 41 cm. Lungs: Clear to auscultation bilaterally without wheezes or crackles. Heart: Regular rate and rhythm without murmurs, rubs, or gallops. Abdomen: Soft, nontender, nondistended. Extremities: No lower extremity edema.   No results found for: WBC, HGB, HCT, MCV, PLT  Lab Results  Component Value Date   NA 139 05/22/2020   K 4.2 05/22/2020   CL 103 05/22/2020   CO2 26 05/22/2020   BUN 17 05/22/2020   CREATININE 0.86 05/22/2020   GLUCOSE 115 (H) 05/22/2020    No results found for: CHOL, HDL, LDLCALC, LDLDIRECT, TRIG, CHOLHDL  --------------------------------------------------------------------------------------------------  ASSESSMENT AND PLAN: PSVT and palpitations: Palpitations have decreased from our prior visit with interval event monitor demonstrating several runs of PSVT up to 33 seconds.  No atrial fibrillation/flutter was observed.  We discussed rationale for medical therapy and have agreed to defer addition of the beta-blocker given minimal symptoms at this time.  We also discussed that anticoagulation is not indicated, as atrial fibrillation/flutter was not identified.  Michelle Cisneros will reach out to [place] if she has recrudescence of symptoms.  Insomnia and fatigue: Michelle Cisneros reports poor sleep and has frequent daytime somnolence and sometimes finds her self nodding off in the evenings.  She has not been evaluated for sleep apnea, which I think would be appropriate given her symptoms as well as supraventricular ectopy and history of hypertension.  Her STOP-Bang score is 4.  We will arrange for home sleep study.   STOP-BANG RISK ASSESSMENT  STOP-BANG 11/19/2021  Do you snore loudly? No  Do you often feel tired, fatigued, or sleepy during the daytime? Yes  Has anyone observed you stop breathing during sleep? No  Do you have (or are you being  treated for) high blood pressure? Yes  Recent BMI (Calculated) 31.33  Is BMI greater than 35 kg/m2? 0=No  Age older than 67 years old? 1=Yes  Has large neck size > 40 cm (15.7 in, large female shirt size, large female collar size > 16) Yes  Gender - Female 0=No  STOP-Bang Total Score 4   Hypertension: Blood pressure well controlled today.  Continue current dose of lisinopril.  Hyperlipidemia: Continue atorvastatin with ongoing management per Dr. Kary Kos.  Follow-up: Return to clinic in 6 months.  Nelva Bush, MD 11/19/2021 9:20 AM

## 2021-11-19 NOTE — Patient Instructions (Signed)
Medication Instructions:   Your physician recommends that you continue on your current medications as directed. Please refer to the Current Medication list given to you today.  *If you need a refill on your cardiac medications before your next appointment, please call your pharmacy*   Lab Work:  None ordered  Testing/Procedures:  WatchPAT?  Is a FDA cleared portable home sleep study test that uses a watch and 3 points of contact to monitor 7 different channels, including your heart rate, oxygen saturations, body position, snoring, and chest motion.  The study is easy to use from the comfort of your own home and accurately detect sleep apnea.  Before bed, you attach the chest sensor, attached the sleep apnea bracelet to your nondominant hand, and attach the finger probe.  After the study, the raw data is downloaded from the watch and scored for apnea events.   For more information: [email or web address]   Your provider has recommended that you complete a home sleep study: [place] [facility]  Please follow instructions on handout given. Please keep device box closed and sealed until our office has notified you that insurance has authorized test.  Our office will then give you a 4 digit PIN code that you will use when you initiate your home test.     Follow-Up: At [facility], you and your health needs are our priority.  As part of our continuing mission to provide you with exceptional heart care, we have created designated Provider Care Teams.  These Care Teams include your primary Cardiologist (physician) and Advanced Practice Providers (APPs -  Physician Assistants and Nurse Practitioners) who all work together to provide you with the care you need, when you need it.  We recommend signing up for the patient portal called "MyChart".  Sign up information is provided on this After Visit Summary.  MyChart is used to connect with patients for Virtual Visits (Telemedicine).   Patients are able to view lab/test results, encounter notes, upcoming appointments, etc.  Non-urgent messages can be sent to your provider as well.   To learn more about what you can do with MyChart, go to [email or web address].    Your next appointment:   6 month(s)  The format for your next appointment:   In Person  Provider:   You may see Nelva Bush, MD or one of the following Advanced Practice Providers on your designated Care Team:   Murray Hodgkins, NP Christell Faith, PA-C Cadence Kathlen Mody, [place]

## 2021-11-20 ENCOUNTER — Telehealth: Payer: Self-pay | Admitting: *Deleted

## 2021-11-20 NOTE — Telephone Encounter (Signed)
Spoke with pt. PIN code given.  Pt aware she may begin HST.  Reviewed instructions with pt.  Pt voiced understanding and has no further questions.

## 2021-11-20 NOTE — Telephone Encounter (Signed)
-----   Message from Lauralee Evener, [place] sent at 11/19/2021  4:05 PM EST ----- Regarding: RE: WatchPat No PA is required for HST. Ok to activate. ----- Message ----- From: Solmon Ice, RN Sent: 11/19/2021  10:06 AM EST To: Cv Div Sleep Studies Subject: WatchPat                                       Good morning,  This pt was given a WatchPat in clinic today. Dx: Fatigue, HTN  Sending for precert.  Thank you,  Jinny Blossom

## 2021-11-23 ENCOUNTER — Encounter (INDEPENDENT_AMBULATORY_CARE_PROVIDER_SITE_OTHER): Payer: PPO | Admitting: Cardiology

## 2021-11-23 DIAGNOSIS — G4733 Obstructive sleep apnea (adult) (pediatric): Secondary | ICD-10-CM

## 2021-11-27 NOTE — Procedures (Signed)
   Sleep Study Report  Patient Information Study Date: 11/23/21 Patient Name: Michelle Cisneros Patient ID: [id number] Birth Date: 2056/01/04 Age: 65 Gender:Female BMI: 31.2 (W=194 lb, H=5' 6'') Referring Physician:Christopher End, MD  TEST DESCRIPTION: Home sleep apnea testing was completed using the WatchPat, a Type 1 device, utilizing peripheral arterial tonometry (PAT), chest movement, actigraphy, pulse oximetry, pulse rate, body position and snore. AHI was calculated with apnea and hypopnea using valid sleep time as the denominator. RDI includes apneas, hypopneas, and RERAs. The data acquired and the scoring of sleep and all associated events were performed in accordance with the recommended standards and specifications as outlined in the AASM Manual for the Scoring of Sleep and Associated Events 2.2.0 (2015).  FINDINGS: 1. Moderate Obstructive Sleep Apnea with AHI 18.4/hr. 2. No Central Sleep Apnea with pAHIc 1.4/hr. 3. Oxygen desaturations as low as 72%. 4. Mild snoring was present. O2 sats were < 88% for 27.1 min. 5. Total sleep time was 5 hrs and 49 min. 6. 40.4 % of total sleep time was spent in REM sleep. 7. Prolonged sleep onset latency at 30 min 8. Prolonged REM sleep onset latency at 110 min. 9. Total awakenings were 15.  DIAGNOSIS: Moderate Obstructive Sleep Apnea (G47.33) Nocturnal Hypoxemia  RECOMMENDATIONS: 1. Clinical correlation of these findings is necessary. The decision to treat obstructive sleep apnea (OSA) is usually based on the presence of apnea symptoms or the presence of associated medical conditions such as Hypertension, Congestive Heart Failure, Atrial Fibrillation or Obesity. The most common symptoms of OSA are snoring, gasping for breath while sleeping, daytime sleepiness and fatigue.  2. Initiating apnea therapy is recommended given the presence of symptoms and/or associated conditions. Recommend proceeding with one of the following:   a.  Auto-CPAP therapy with a pressure range of 5-20cm H2O.   b. An oral appliance (OA) that can be obtained from certain dentists with expertise in sleep medicine. These are primarily of use in non-obese patients with mild and moderate disease.   c. An ENT consultation which may be useful to look for specific causes of obstruction and possible treatment options.   d. If patient is intolerant to PAP therapy, consider referral to ENT for evaluation for hypoglossal nerve stimulator.  3. Close follow-up is necessary to ensure success with CPAP or oral appliance therapy for maximum benefit .  4. A follow-up oximetry study on CPAP is recommended to assess the adequacy of therapy and determine the need for supplemental oxygen or the potential need for Bi-level therapy. An arterial blood gas to determine the adequacy of baseline ventilation and oxygenation should also be considered.  5. Healthy sleep recommendations include: adequate nightly sleep (normal 7-9 hrs/night), avoidance of caffeine after noon and alcohol near bedtime, and maintaining a sleep environment that is cool, dark and quiet.  6. Weight loss for overweight patients is recommended. Even modest amounts of weight loss can significantly improve the severity of sleep apnea.  7. Snoring recommendations include: weight loss where appropriate, side sleeping, and avoidance of alcohol before bed.  8. Operation of motor vehicle should not be performed when sleepy.  Signature: Electronically Signed: 11/28/21 Fransico Him, MD; [facility]; [place], American Board of Sleep Medicine

## 2021-11-30 DIAGNOSIS — R300 Dysuria: Secondary | ICD-10-CM | POA: Diagnosis not present

## 2021-12-01 ENCOUNTER — Encounter: Payer: Self-pay | Admitting: Urology

## 2021-12-02 ENCOUNTER — Ambulatory Visit: Payer: PPO

## 2021-12-02 DIAGNOSIS — R5383 Other fatigue: Secondary | ICD-10-CM

## 2021-12-02 DIAGNOSIS — I10 Essential (primary) hypertension: Secondary | ICD-10-CM

## 2021-12-03 ENCOUNTER — Telehealth: Payer: Self-pay

## 2021-12-03 ENCOUNTER — Telehealth: Payer: Self-pay | Admitting: Internal Medicine

## 2021-12-03 MED ORDER — NITROFURANTOIN MONOHYD MACRO 100 MG PO CAPS: 100.0000 mg | ORAL_CAPSULE | Freq: Every day | ORAL | 11 refills | Status: AC

## 2021-12-03 NOTE — Telephone Encounter (Signed)
Spoke with Sharyn Lull with [facility] regarding the Lisinopril 20 mg clarification. Gave a verbal clarification that the Lisinopril 20 mg one tablet daily is the correct dose. Sharyn Lull stated, it will take 7-10 business days for the patient to receive it.

## 2021-12-03 NOTE — Telephone Encounter (Signed)
Pharmacy calling for clarification  States lisinopril 20 MG was sent in but patient picked up lisinopril 10 MG from local pharmacy  Wants to clarify change was intended Please call [phone]  Ref # [id number]

## 2021-12-03 NOTE — Telephone Encounter (Signed)
As per Dr. Matilde Sprang.Stop daily triprim  Start daily macrodantin 100 mg once daily 30x11   Patient aware. Sent medication to pharmacy.

## 2021-12-04 NOTE — Telephone Encounter (Signed)
[facility] calling again in regards to clarification States that information received yesterday was incorrect and wants reconfirmation Please call to discuss

## 2021-12-11 NOTE — Telephone Encounter (Signed)
Spoke with [place] at [facility] who states they spoke with the patient on 12/08/21 and the patient advised them that she was filling the Rx locally and did not need a refill from them at this time.

## 2021-12-23 ENCOUNTER — Telehealth: Payer: Self-pay | Admitting: *Deleted

## 2021-12-23 DIAGNOSIS — G4733 Obstructive sleep apnea (adult) (pediatric): Secondary | ICD-10-CM

## 2021-12-23 NOTE — Telephone Encounter (Addendum)
The patient has been notified of the result and verbalized understanding.  All questions (if any) were answered. Marolyn Hammock, [place] 12/20/4641 1:42 PM   Precert titration

## 2021-12-23 NOTE — Telephone Encounter (Incomplete Revision)
The patient has been notified of the result and verbalized understanding.  All questions (if any) were answered. Marolyn Hammock, [place] 08/26/5882 2:54 PM   Precert pending

## 2021-12-23 NOTE — Telephone Encounter (Signed)
-----   Message from Sueanne Margarita, MD sent at 11/27/2021  9:11 PM EST ----- Please let patient know that they have sleep apnea.  Recommend therapeutic CPAP titration for treatment of patient's sleep disordered breathing.  If unable to perform an in lab titration then initiate ResMed auto CPAP from 4 to 15cm H2O with heated humidity and mask of choice and overnight pulse ox on CPAP.

## 2022-01-08 DIAGNOSIS — E042 Nontoxic multinodular goiter: Secondary | ICD-10-CM | POA: Diagnosis not present

## 2022-01-26 ENCOUNTER — Telehealth: Payer: Self-pay | Admitting: Internal Medicine

## 2022-01-26 MED ORDER — LISINOPRIL 20 MG PO TABS: 20.0000 mg | ORAL_TABLET | Freq: Every day | ORAL | 0 refills | Status: DC

## 2022-01-26 NOTE — Telephone Encounter (Signed)
°*  STAT* If patient is at the pharmacy, call can be transferred to refill team.   1. Which medications need to be refilled? (please list name of each medication and dose if known) lisinopril 10 MG 1 tablet daily   2. Which pharmacy/location (including street and city if local pharmacy) is medication to be sent to? [facility]    3. Do they need a 30 day or 90 day supply? 90 day

## 2022-01-26 NOTE — Telephone Encounter (Signed)
Requested Prescriptions   Signed Prescriptions Disp Refills   lisinopril (ZESTRIL) 20 MG tablet 90 tablet 0    Sig: Take 1 tablet (20 mg total) by mouth daily.    Authorizing Provider: END, CHRISTOPHER    Ordering User: Britt Bottom

## 2022-03-08 DIAGNOSIS — E785 Hyperlipidemia, unspecified: Secondary | ICD-10-CM | POA: Diagnosis not present

## 2022-03-08 DIAGNOSIS — I10 Essential (primary) hypertension: Secondary | ICD-10-CM | POA: Diagnosis not present

## 2022-03-08 NOTE — Telephone Encounter (Signed)
APPROVED 03/20/22--6/30-23  ?ready to schedule ?

## 2022-03-15 DIAGNOSIS — E785 Hyperlipidemia, unspecified: Secondary | ICD-10-CM | POA: Diagnosis not present

## 2022-03-15 DIAGNOSIS — I10 Essential (primary) hypertension: Secondary | ICD-10-CM | POA: Diagnosis not present

## 2022-03-15 DIAGNOSIS — Z0000 Encounter for general adult medical examination without abnormal findings: Secondary | ICD-10-CM | POA: Diagnosis not present

## 2022-03-15 DIAGNOSIS — I471 Supraventricular tachycardia: Secondary | ICD-10-CM | POA: Diagnosis not present

## 2022-03-15 DIAGNOSIS — I700 Atherosclerosis of aorta: Secondary | ICD-10-CM | POA: Diagnosis not present

## 2022-03-15 NOTE — Telephone Encounter (Signed)
Patient is scheduled for CPAP Titration on 4 24/23. ?Patient understands his titration study will be done at [facility] sleep lab. ?Patient agrees with treatment and thanked me for call.  ?

## 2022-03-15 NOTE — Addendum Note (Signed)
Addended by: Freada Bergeron on: 03/15/2022 04:15 PM ? ? Modules accepted: Orders ? ?

## 2022-03-18 DIAGNOSIS — H0288A Meibomian gland dysfunction right eye, upper and lower eyelids: Secondary | ICD-10-CM | POA: Diagnosis not present

## 2022-03-18 DIAGNOSIS — H0288B Meibomian gland dysfunction left eye, upper and lower eyelids: Secondary | ICD-10-CM | POA: Diagnosis not present

## 2022-03-18 DIAGNOSIS — H524 Presbyopia: Secondary | ICD-10-CM | POA: Diagnosis not present

## 2022-03-18 DIAGNOSIS — H5203 Hypermetropia, bilateral: Secondary | ICD-10-CM | POA: Diagnosis not present

## 2022-03-18 DIAGNOSIS — H52213 Irregular astigmatism, bilateral: Secondary | ICD-10-CM | POA: Diagnosis not present

## 2022-03-23 ENCOUNTER — Other Ambulatory Visit: Payer: Self-pay | Admitting: Family Medicine

## 2022-03-23 DIAGNOSIS — Z1231 Encounter for screening mammogram for malignant neoplasm of breast: Secondary | ICD-10-CM

## 2022-03-30 ENCOUNTER — Other Ambulatory Visit: Payer: Self-pay | Admitting: Internal Medicine

## 2022-04-12 ENCOUNTER — Encounter (HOSPITAL_BASED_OUTPATIENT_CLINIC_OR_DEPARTMENT_OTHER): Payer: PPO | Admitting: Cardiology

## 2022-04-19 ENCOUNTER — Ambulatory Visit: Payer: PPO | Admitting: Dermatology

## 2022-04-19 DIAGNOSIS — L578 Other skin changes due to chronic exposure to nonionizing radiation: Secondary | ICD-10-CM

## 2022-04-19 DIAGNOSIS — L918 Other hypertrophic disorders of the skin: Secondary | ICD-10-CM

## 2022-04-19 DIAGNOSIS — D229 Melanocytic nevi, unspecified: Secondary | ICD-10-CM | POA: Diagnosis not present

## 2022-04-19 DIAGNOSIS — L814 Other melanin hyperpigmentation: Secondary | ICD-10-CM

## 2022-04-19 DIAGNOSIS — L821 Other seborrheic keratosis: Secondary | ICD-10-CM | POA: Diagnosis not present

## 2022-04-19 DIAGNOSIS — D1800 Hemangioma unspecified site: Secondary | ICD-10-CM

## 2022-04-19 DIAGNOSIS — L570 Actinic keratosis: Secondary | ICD-10-CM

## 2022-04-19 DIAGNOSIS — Z85828 Personal history of other malignant neoplasm of skin: Secondary | ICD-10-CM | POA: Diagnosis not present

## 2022-04-19 DIAGNOSIS — L719 Rosacea, unspecified: Secondary | ICD-10-CM | POA: Diagnosis not present

## 2022-04-19 DIAGNOSIS — Z85820 Personal history of malignant melanoma of skin: Secondary | ICD-10-CM

## 2022-04-19 DIAGNOSIS — Z1283 Encounter for screening for malignant neoplasm of skin: Secondary | ICD-10-CM | POA: Diagnosis not present

## 2022-04-19 DIAGNOSIS — D225 Melanocytic nevi of trunk: Secondary | ICD-10-CM | POA: Diagnosis not present

## 2022-04-19 DIAGNOSIS — L820 Inflamed seborrheic keratosis: Secondary | ICD-10-CM | POA: Diagnosis not present

## 2022-04-19 DIAGNOSIS — L7211 Pilar cyst: Secondary | ICD-10-CM

## 2022-04-19 DIAGNOSIS — D485 Neoplasm of uncertain behavior of skin: Secondary | ICD-10-CM

## 2022-04-19 MED ORDER — METRONIDAZOLE 0.75 % EX CREA: TOPICAL_CREAM | CUTANEOUS | 5 refills | Status: DC

## 2022-04-19 NOTE — Patient Instructions (Addendum)
Cryotherapy Aftercare ? ?Wash gently with soap and water everyday.   ?Apply Vaseline and Band-Aid daily until healed.  ? ? ?Wound Care Instructions ? ?Cleanse wound gently with soap and water once a day then pat dry with clean gauze. Apply a thing coat of Petrolatum (petroleum jelly, "Vaseline") over the wound (unless you have an allergy to this). We recommend that you use a new, sterile tube of Vaseline. Do not pick or remove scabs. Do not remove the yellow or white "healing tissue" from the base of the wound. ? ?Cover the wound with fresh, clean, nonstick gauze and secure with paper tape. You may use Band-Aids in place of gauze and tape if the would is small enough, but would recommend trimming much of the tape off as there is often too much. Sometimes Band-Aids can irritate the skin. ? ?You should call the office for your biopsy report after 1 week if you have not already been contacted. ? ?If you experience any problems, such as abnormal amounts of bleeding, swelling, significant bruising, significant pain, or evidence of infection, please call the office immediately. ? ?FOR ADULT SURGERY PATIENTS: If you need something for pain relief you may take 1 extra strength Tylenol (acetaminophen) AND 2 Ibuprofen ('200mg'$  each) together every 4 hours as needed for pain. (do not take these if you are allergic to them or if you have a reason you should not take them.) Typically, you may only need pain medication for 1 to 3 days.  ? ? ? ? ?Melanoma ABCDEs ? ?Melanoma is the most dangerous type of skin cancer, and is the leading cause of death from skin disease.  You are more likely to develop melanoma if you: ?Have light-colored skin, light-colored eyes, or red or blond hair ?Spend a lot of time in the sun ?Tan regularly, either outdoors or in a tanning bed ?Have had blistering sunburns, especially during childhood ?Have a close family member who has had a melanoma ?Have atypical moles or large birthmarks ? ?Early detection  of melanoma is key since treatment is typically straightforward and cure rates are extremely high if we catch it early.  ? ?The first sign of melanoma is often a change in a mole or a new dark spot.  The ABCDE system is a way of remembering the signs of melanoma. ? ?A for asymmetry:  The two halves do not match. ?B for border:  The edges of the growth are irregular. ?C for color:  A mixture of colors are present instead of an even brown color. ?D for diameter:  Melanomas are usually (but not always) greater than 13m - the size of a pencil eraser. ?E for evolution:  The spot keeps changing in size, shape, and color. ? ?Please check your skin once per month between visits. You can use a small mirror in front and a large mirror behind you to keep an eye on the back side or your body.  ? ?If you see any new or changing lesions before your next follow-up, please call to schedule a visit. ? ?Please continue daily skin protection including broad spectrum sunscreen SPF 30+ to sun-exposed areas, reapplying every 2 hours as needed when you're outdoors.  ? ?Staying in the shade or wearing long sleeves, sun glasses (UVA+UVB protection) and wide brim hats (4-inch brim around the entire circumference of the hat) are also recommended for sun protection.   ? ?If You Need Anything After Your Visit ? ?If you have any questions or concerns  for your doctor, please call our main line at [phone] and press option 4 to reach your doctor's medical assistant. If no one answers, please leave a voicemail as directed and we will return your call as soon as possible. Messages left after 4 pm will be answered the following business day.  ? ?You may also send [place] a message via MyChart. We typically respond to MyChart messages within 1-2 business days. ? ?For prescription refills, please ask your pharmacy to contact our office. Our fax number is [phone]. ? ?If you have an urgent issue when the clinic is closed that cannot wait until the  next business day, you can page your doctor at the number below.   ? ?Please note that while we do our best to be available for urgent issues outside of office hours, we are not available 24/7.  ? ?If you have an urgent issue and are unable to reach [place], you may choose to seek medical care at your doctor's office, retail clinic, urgent care center, or emergency room. ? ?If you have a medical emergency, please immediately call 911 or go to the emergency department. ? ?Pager Numbers ? ?- Dr. Nehemiah Massed: [phone] ? ?- Dr. Laurence Ferrari: [phone] ? ?- Dr. Nicole Kindred: [phone] ? ?In the event of inclement weather, please call our main line at [phone] for an update on the status of any delays or closures. ? ?Dermatology Medication Tips: ?Please keep the boxes that topical medications come in in order to help keep track of the instructions about where and how to use these. Pharmacies typically print the medication instructions only on the boxes and not directly on the medication tubes.  ? ?If your medication is too expensive, please contact our office at [phone] option 4 or send [place] a message through [facility].  ? ?We are unable to tell what your co-pay for medications will be in advance as this is different depending on your insurance coverage. However, we may be able to find a substitute medication at lower cost or fill out paperwork to get insurance to cover a needed medication.  ? ?If a prior authorization is required to get your medication covered by your insurance company, please allow [place] 1-2 business days to complete this process. ? ?Drug prices often vary depending on where the prescription is filled and some pharmacies may offer cheaper prices. ? ?The website www.goodrx.com contains coupons for medications through different pharmacies. The prices here do not account for what the cost may be with help from insurance (it may be cheaper with your insurance), but the website can give you the price if you did not  use any insurance.  ?- You can print the associated coupon and take it with your prescription to the pharmacy.  ?- You may also stop by our office during regular business hours and pick up a GoodRx coupon card.  ?- If you need your prescription sent electronically to a different pharmacy, notify our office through [facility] or by phone at [phone] option 4. ? ? ? ? ?Si Usted Necesita Algo Despu?s de Su Visita ? ?Tambi?n puede enviarnos un mensaje a trav?s de MyChart. Por lo general respondemos a los mensajes de MyChart en el transcurso de 1 a 2 d?as h?biles. ? ?Para renovar recetas, por favor pida a su farmacia que se ponga en contacto con nuestra oficina. Nuestro n?mero de fax es el [phone]. ? ?Si tiene un asunto urgente cuando la cl?nica est? cerrada y que no puede esperar [employer]  el siguiente d?a h?bil, puede llamar/localizar a su doctor(a) al n?mero que aparece a continuaci?n.  ? ?Por favor, tenga en cuenta que aunque hacemos todo lo posible para estar disponibles para asuntos urgentes fuera del horario de oficina, no estamos disponibles las 24 horas del d?a, los 7 d?as de la semana.  ? ?Si tiene un problema urgente y no puede comunicarse con nosotros, puede optar por buscar atenci?n m?dica  en el consultorio de su doctor(a), en una cl?nica privada, en un centro de atenci?n urgente o en una sala de emergencias. ? ?Si tiene Engineer, maintenance (IT) m?dica, por favor llame inmediatamente al 911 o vaya a la sala de emergencias. ? ?N?meros de b?per ? ?- Dr. Nehemiah Massed: [phone] ? ?- Dra. Moye: [phone] ? ?- Dra. Nicole Kindred: [phone] ? ?En caso de inclemencias del tiempo, por favor llame a nuestra l?nea principal al [phone] para una actualizaci?n sobre el estado de cualquier retraso o cierre. ? ?Consejos para la medicaci?n en dermatolog?a: ?Por favor, guarde las cajas en las que vienen los medicamentos de uso t?pico para ayudarle a seguir las instrucciones sobre d?nde y c?mo usarlos. Las farmacias  generalmente imprimen las instrucciones del medicamento s?lo en las cajas y no directamente en los tubos del [place].  ? ?Si su medicamento es muy caro, por favor, p?ngase en contacto con nuestra ofic

## 2022-04-19 NOTE — Progress Notes (Signed)
? ?Follow-Up Visit ?  ?Subjective  ?Michelle Cisneros is a 66 y.o. female who presents for the following: Annual Exam. ? ?The patient presents for Total-Body Skin Exam (TBSE) for skin cancer screening and mole check.  The patient has spots, moles and lesions to be evaluated, some may be new or changing. She has a new spot on her back that she noticed in the last week, stays red and a little itchy. She has a history of melanoma of the right forearm (2015), history of BCC of the left forehead, and history of SCC of the left medial ankle.  She has rosacea and uses an OTC cream since Rx was too expensive. ? ?The following portions of the chart were reviewed this encounter and updated as appropriate:  ?  ?  ? ?Review of Systems:  No other skin or systemic complaints except as noted in HPI or Assessment and Plan. ? ?Objective  ?Well appearing patient in no apparent distress; mood and affect are within normal limits. ? ?A full examination was performed including scalp, head, eyes, ears, nose, lips, neck, chest, axillae, abdomen, back, buttocks, bilateral upper extremities, bilateral lower extremities, hands, feet, fingers, toes, fingernails, and toenails. All findings within normal limits unless otherwise noted below. ? ?Right Lower Back; Left Medial Buttock ?Right Lower Back: ?5.0 x 2.59m med light brown macule ?  ?Left medial buttock: ?8.032mmed light brown macule ? ?R upper back above bra x 1, L shoulder x 1 (2) ?Erythematous stuck-on, waxy patch (back) and papule (shoulder) ? ?Right Posterior Shoulder ?4.0 mm firm white papule ? ? ? ? ? ?face ?Mild erythema of the malar cheeks and nose; telangiectasias of the nose ? ?Left Upper Vermilion Lip ?Pink scaly macule ? ? ? ?Assessment & Plan  ?Skin cancer screening performed today. ? ?Actinic Damage ?- chronic, secondary to cumulative UV radiation exposure/sun exposure over time ?- diffuse scaly erythematous macules with underlying dyspigmentation ?- Recommend daily broad  spectrum sunscreen SPF 30+ to sun-exposed areas, reapply every 2 hours as needed.  ?- Recommend staying in the shade or wearing long sleeves, sun glasses (UVA+UVB protection) and wide brim hats (4-inch brim around the entire circumference of the hat). ?- Call for new or changing lesions. ? ?History of Melanoma (2015) ?- No evidence of recurrence today of the right forearm ?- Recommend regular full body skin exams ?- Recommend daily broad spectrum sunscreen SPF 30+ to sun-exposed areas, reapply every 2 hours as needed.  ?- Call if any new or changing lesions are noted between office visits ? ?History of Squamous Cell Carcinoma of the Skin ?- No evidence of recurrence today of the left medial ankle ?- Recommend regular full body skin exams ?- Recommend daily broad spectrum sunscreen SPF 30+ to sun-exposed areas, reapply every 2 hours as needed.  ?- Call if any new or changing lesions are noted between office visits ? ? ?History of Basal Cell Carcinoma of the Skin ?- No evidence of recurrence today of the left forehead ?- Recommend regular full body skin exams ?- Recommend daily broad spectrum sunscreen SPF 30+ to sun-exposed areas, reapply every 2 hours as needed.  ?- Call if any new or changing lesions are noted between office visits ? ?Seborrheic Keratoses ?- Stuck-on, waxy, tan-brown papules and/or plaques  ?- Benign-appearing ?- Discussed benign etiology and prognosis. ?- Observe ?- Call for any changes ? ?Lentigines ?- Scattered tan macules ?- Due to sun exposure ?- Benign-appering, observe ?- Recommend daily broad spectrum sunscreen SPF  30+ to sun-exposed areas, reapply every 2 hours as needed. ?- Call for any changes ? ?Acrochordons (Skin Tags) ?- Fleshy, skin-colored pedunculated papules of the right chest ?- Benign appearing.  ?- Observe. ?- If desired, they can be removed with an in office procedure that is not covered by insurance. ?- Please call the clinic if you notice any new or changing  lesions. ? ?Melanocytic Nevi ?- Tan-brown and/or pink-flesh-colored symmetric macules and papules ?- Benign appearing on exam today ?- Observation ?- Call clinic for new or changing moles ?- Recommend daily use of broad spectrum spf 30+ sunscreen to sun-exposed areas.  ? ?Hemangiomas ?- Red papules ?- Discussed benign nature ?- Observe ?- Call for any changes ? ? ?Nevus ?Right Lower Back; Left Medial Buttock ? ?Benign-appearing.  Stable. Observation.  Call clinic for new or changing moles.  Recommend daily use of broad spectrum spf 30+ sunscreen to sun-exposed areas.  ? ?Inflamed seborrheic keratosis (2) ?R upper back above bra x 1, L shoulder x 1 ? ?Destruction of lesion - R upper back above bra x 1, L shoulder x 1 ? ?Destruction method: cryotherapy   ?Informed consent: discussed and consent obtained   ?Lesion destroyed using liquid nitrogen: Yes   ?Region frozen until ice ball extended beyond lesion: Yes   ?Outcome: patient tolerated procedure well with no complications   ?Post-procedure details: wound care instructions given   ?Additional details:  Prior to procedure, discussed risks of blister formation, small wound, skin dyspigmentation, or rare scar following cryotherapy. Recommend Vaseline ointment to treated areas while healing. ? ? ?Neoplasm of uncertain behavior of skin ?Right Posterior Shoulder ? ?Skin / nail biopsy ?Type of biopsy: tangential   ?Informed consent: discussed and consent obtained   ?Patient was prepped and draped in usual sterile fashion: Area prepped with alcohol. ?Anesthesia: the lesion was anesthetized in a standard fashion   ?Anesthetic:  1% lidocaine w/ epinephrine 1-100,000 buffered w/ 8.4% NaHCO3 ?Instrument used: flexible razor blade   ?Hemostasis achieved with: pressure, aluminum chloride and electrodesiccation   ?Outcome: patient tolerated procedure well   ? ?Destruction of lesion ? ?Destruction method: electrodesiccation and curettage   ?Informed consent: discussed and consent  obtained   ?Timeout:  patient name, date of birth, surgical site, and procedure verified ?Anesthesia: the lesion was anesthetized in a standard fashion   ?Anesthetic:  1% lidocaine w/ epinephrine 1-100,000 local infiltration ?Curettage performed in three different directions: Yes   ?Electrodesiccation performed over the curetted area: Yes   ?Final wound size (cm):  0.6 ?Hemostasis achieved with:  pressure, aluminum chloride and electrodesiccation ?Outcome: patient tolerated procedure well with no complications   ?Post-procedure details: wound care instructions given   ?Post-procedure details comment:  Ointment and bandage applied. ? ?Specimen 1 - Surgical pathology ?Differential Diagnosis: Cyst vs other r/o BCC ?Check Margins: No ?4.0 mm firm white papule ?*2 specimen ?EDC today ? ? ?Rosacea ?face ? ?Mild ? ?Rosacea is a chronic progressive skin condition usually affecting the face of adults, causing redness and/or acne bumps. It is treatable but not curable. It sometimes affects the eyes (ocular rosacea) as well. It may respond to topical and/or systemic medication and can flare with stress, sun exposure, alcohol, exercise and some foods.  Daily application of broad spectrum spf 30+ sunscreen to face is recommended to reduce flares. ? ?Start metronidazole 0.75% cream qd/bid.  ? ?metroNIDAZOLE (METROCREAM) 0.75 % cream - face ?Apply to face once to twice daily for rosacea. ? ?AK (actinic keratosis) ?  Left Upper Vermilion Lip ? ?Actinic keratoses are precancerous spots that appear secondary to cumulative UV radiation exposure/sun exposure over time. They are chronic with expected duration over 1 year. A portion of actinic keratoses will progress to squamous cell carcinoma of the skin. It is not possible to reliably predict which spots will progress to skin cancer and so treatment is recommended to prevent development of skin cancer. ? ?Recommend daily broad spectrum sunscreen SPF 30+ to sun-exposed areas, reapply  every 2 hours as needed.  ?Recommend staying in the shade or wearing long sleeves, sun glasses (UVA+UVB protection) and wide brim hats (4-inch brim around the entire circumference of the hat). ?Call for new or changing lesio

## 2022-04-21 ENCOUNTER — Telehealth: Payer: Self-pay

## 2022-04-21 NOTE — Telephone Encounter (Signed)
Advised patient biopsy of the right posterior shoulder was a benign cyst and no further treatment needed.  ?

## 2022-04-21 NOTE — Telephone Encounter (Signed)
-----   Message from Brendolyn Patty, MD sent at 04/20/2022  7:25 PM EDT ----- ?Skin , right posterior shoulder ?PILAR CYST ? ?Benign cyst- was removed by [facility] at time of biopsy ? ? - please call patient ?

## 2022-04-23 ENCOUNTER — Ambulatory Visit (HOSPITAL_BASED_OUTPATIENT_CLINIC_OR_DEPARTMENT_OTHER): Payer: PPO | Admitting: Cardiology

## 2022-04-27 ENCOUNTER — Ambulatory Visit (HOSPITAL_BASED_OUTPATIENT_CLINIC_OR_DEPARTMENT_OTHER): Payer: PPO | Attending: Cardiology | Admitting: Cardiology

## 2022-04-27 DIAGNOSIS — G4733 Obstructive sleep apnea (adult) (pediatric): Secondary | ICD-10-CM | POA: Insufficient documentation

## 2022-04-28 NOTE — Procedures (Signed)
? ?  Patient Name: Michelle Cisneros, Michelle Cisneros ?Study Date: 04/27/2022 ?Gender: Female ?D.O.B: [birth year] ?Age (years): 24 ?Referring Provider: Fransico Him MD, ABSM ?Height (inches): 64 ?Interpreting Physician: Fransico Him MD, ABSM ?Weight (lbs): 176 ?RPSGT: Carolin Coy ?BMI: 30 ?MRN: [id number] ?Neck Size: 14.00 ? ?CLINICAL INFORMATION ?The patient is referred for a CPAP titration to treat sleep apnea. ? ?SLEEP STUDY TECHNIQUE ?As per the AASM Manual for the Scoring of Sleep and Associated Events v2.3 (April 2016) with a hypopnea requiring 4% desaturations. ? ?The channels recorded and monitored were frontal, central and occipital EEG, electrooculogram (EOG), submentalis EMG (chin), nasal and oral airflow, thoracic and abdominal wall motion, anterior tibialis EMG, snore microphone, electrocardiogram, and pulse oximetry. Continuous positive airway pressure (CPAP) was initiated at the beginning of the study and titrated to treat sleep-disordered breathing. ? ?MEDICATIONS ?Medications self-administered by patient taken the night of the study : N/A ? ?TECHNICIAN COMMENTS ?Comments added by technician: Patient was ordered as a Cpap titration ?Comments added by scorer: N/A ? ?RESPIRATORY PARAMETERS ?Optimal PAP Pressure (cm): 10  ?AHI at Optimal Pressure (/hr):48.3 ?Overall Minimal O2 (%):84.0  ?Supine % at Optimal Pressure (%):100 ?Minimal O2 at Optimal Pressure (%): 91.0  ? ?SLEEP ARCHITECTURE ?The study was initiated at 10:47:34 PM and ended at 5:24:22 AM. ? ?Sleep onset time was 14.9 minutes and the sleep efficiency was 30.9%. The total sleep time was 122.5 minutes. ? ?The patient spent 13.5% of the night in stage N1 sleep, 66.1% in stage N2 sleep, 0.4% in stage N3 and 20% in REM.Stage REM latency was 339.5 minutes ? ?Wake after sleep onset was 259.4. Alpha intrusion was absent. Supine sleep was 37.55%. ? ?CARDIAC DATA ?The 2 lead EKG demonstrated sinus rhythm. The mean heart rate was 57.6 beats per minute. Other EKG  findings include: PACs. ? ?LEG MOVEMENT DATA ?The total Periodic Limb Movements of Sleep (PLMS) were 0. The PLMS index was 0.0. A PLMS index of <15 is considered normal in adults. ? ?IMPRESSIONS ?- The optimal PAP pressure could not be obtained during study due to insufficient sleep time and ongoing respiratory events ?- Central sleep apnea was not noted during this titration (CAI = 0.5/h). ?- Moderate oxygen desaturations were observed during this titration (min O2 = 84.0%). ?- The patient snored with soft snoring volume during this titration study. ?- PACs were observed during this study. ?- Clinically significant periodic limb movements were not noted during this study. Arousals associated with PLMs were rare. ? ?DIAGNOSIS ?- Obstructive Sleep Apnea (G47.33) ? ?RECOMMENDATIONS ?- Recommend repeat CPAP titration with sleep aide. ?- Avoid alcohol, sedatives and other CNS depressants that may worsen sleep apnea and disrupt normal sleep architecture. ?- Sleep hygiene should be reviewed to assess factors that may improve sleep quality. ?- Weight management and regular exercise should be initiated or continued. ? ?[Electronically signed] 04/28/2022 09:57 AM ? ?Fransico Him MD, ABSM ?Diplomate, Tax adviser of Sleep Medicine ?

## 2022-04-29 ENCOUNTER — Ambulatory Visit
Admission: RE | Admit: 2022-04-29 | Discharge: 2022-04-29 | Disposition: A | Discharge status: Home / Self Care | Payer: PPO | Source: Ambulatory Visit | Attending: Family Medicine | Admitting: Family Medicine

## 2022-04-29 DIAGNOSIS — Z1231 Encounter for screening mammogram for malignant neoplasm of breast: Secondary | ICD-10-CM | POA: Diagnosis not present

## 2022-05-19 ENCOUNTER — Telehealth: Payer: Self-pay | Admitting: *Deleted

## 2022-05-19 NOTE — Progress Notes (Unsigned)
Follow-up Outpatient Visit Date: 05/21/2022  Primary Care Provider: Maryland Pink, MD [place] [address]  Chief Complaint: Follow-up palpitations  HPI:  Michelle Cisneros is a 66 y.o. female with history of aortic atherosclerosis, hypertension, hyperlipidemia, chronic back pain following fracture, thyroid nodule, and skin cancer, who presents for follow-up of palpitations and fatigue.  I last saw her in early December, at which time she was feeling fairly well with less frequent palpitations.  She was not sleeping well and complained of daytime fatigue.  Subsequent sleep study revealed moderate OSA with recommendations to initiate CPAP therapy.  CPAP titration earlier this month could not be performed due to insufficient sleep time and ongoing respiratory events.  Today, Ms. Savitt reports that she has been feeling well.  She denies chest pain, shortness of breath, palpitations, lightheadedness, and edema.  Home BP's have been well controlled.  She has been monitoring her rhythm at home with her Alivecor Kardia device.  She notes one reading indicated possible a-fib (not available for review) though immediate follow-up tracing shows sinus rhythm.  She has lost some weight through [employer] as well as exercise, though her ability to walk remains limited by back problems.  --------------------------------------------------------------------------------------------------  Cardiovascular History & Procedures: Cardiovascular Problems: Atherosclerosis PSVT   Risk Factors: Atherosclerosis, hypertension, age greater than 70, and prior tobacco use   Cath/PCI: None   CV Surgery: None   EP Procedures and Devices: 14-day event monitor (10/02/2021): Predominantly sinus rhythm with multiple runs of PSVT (lasting up to 33 seconds) as well as occasional PACs and rare PVCs.   Non-Invasive Evaluation(s): TTE (6/3/20201): Technically difficult study.  LVEF  60-65% with normal diastolic function.  Normal RV size and function.  No significant valvular abnormality. Pharmacologic MPI (05/05/2020): Normal study without ischemia or scar.  LVEF 55-65%.  Recent CV Pertinent Labs: Lab Results  Component Value Date   K 4.2 05/22/2020   BUN 17 05/22/2020   CREATININE 0.86 05/22/2020    Past medical and surgical history were reviewed and updated in EPIC.  Current Meds  Medication Sig   aspirin EC 81 MG tablet Take 1 tablet (81 mg total) by mouth daily.   atorvastatin (LIPITOR) 10 MG tablet TAKE ONE TABLET BY MOUTH DAILY   buPROPion (WELLBUTRIN XL) 300 MG 24 hr tablet TAKE ONE TABLET BY MOUTH DAILY   Calcium Carbonate-Vitamin D 600-400 MG-UNIT tablet Take 1 tablet by mouth daily.   fluticasone (FLONASE) 50 MCG/ACT nasal spray Place 2 sprays into both nostrils daily.   lisinopril (ZESTRIL) 20 MG tablet Take 1 tablet by mouth daily   metroNIDAZOLE (METROCREAM) 0.75 % cream Apply to face once to twice daily for rosacea.   nitrofurantoin, macrocrystal-monohydrate, (MACROBID) 100 MG capsule Take 100 mg by mouth daily.    Allergies: Patient has no known allergies.  Social History   Tobacco Use   Smoking status: Former    Packs/day: 1.00    Years: 30.00    Pack years: 30.00    Types: Cigarettes    Quit date: 10/04/1987    Years since quitting: 34.6   Smokeless tobacco: Never  Vaping Use   Vaping Use: Never used  Substance Use Topics   Alcohol use: Yes    Comment: occassional; once glass of wine   Drug use: Not Currently    Family History  Problem Relation Age of Onset   Cancer Father        laryngeal   Atrial fibrillation Father  Stroke Mother    Breast cancer Neg Hx    Bladder Cancer Neg Hx    Kidney cancer Neg Hx     Review of Systems: A 12-system review of systems was performed and was negative except as noted in the  HPI.  --------------------------------------------------------------------------------------------------  Physical Exam: BP 134/76 (BP Location: Left Arm, Patient Position: Sitting, Cuff Size: Normal)   Ht '5\' 4"'$  (1.626 m)   Wt 171 lb 3.2 oz (77.7 kg)   SpO2 95%   BMI 29.39 kg/m   General:  NAD. Neck: No JVD or HJR. Lungs: Clear to auscultation bilaterally without wheezes or crackles. Heart: Bradycardic but regular without murmurs, rubs, or gallops. Abdomen: Soft, nontender, nondistended. Extremities: No lower extremity edema.  EKG:  Sinus bradycardia with sinus arrhythmia.  No significant abnormality.  Outside labs (03/08/2022, [facility]): CBC: WBC 5.3, HGB 14.3, HCT 43.3, PLT 231  CMP: Na 138, K 4.5, Cl 104, CO2 29, BUN 14, creatinine 0.8, glucose 103, Ca 9.8, AST 23, ALT 26, ALP 78, Tbili 0.8, TP 7.1, alb 4.4  Lipid panel: TC 160, TG 69, HDL 74, LDL 72  --------------------------------------------------------------------------------------------------  ASSESSMENT AND PLAN: Palpitations and PSVT: Symptoms have been minimal.  No objective evidence of atrial fibrillation.  Sporadic palpitations in the past most likely due to PSVT as seen on prior event monitor.  No further intervention recommended at this time.  Continued monitoring with Kardia recommended.  Hypertension: BP upper normal today in the office but typically better at home.  Continue current dose of lisinopril.  Hyperlipidemia and aortic atherosclerosis: Lipids adequately controlled on last lipid panel in March through Dr. Barbarann Ehlers office.  Continue atorvastatin with ongoing management per Dr. Kary Kos.  OSA: Ongoing workup per Dr. Radford Pax; hopefully CPAP titration can be reattempted.  Follow-up: Return to clinic in 1 year.  Nelva Bush, MD 05/21/2022 10:12 AM

## 2022-05-19 NOTE — Telephone Encounter (Signed)
The patient has been notified of the result. Left detailed message on voicemail and informed patient to call back with questions.Marolyn Hammock, [place] 05/19/2022 12:08 PM

## 2022-05-19 NOTE — Telephone Encounter (Signed)
-----   Message from Lauralee Evener, [place] sent at 04/28/2022  3:58 PM EDT -----  ----- Message ----- From: Sueanne Margarita, MD Sent: 04/28/2022   9:59 AM EDT To: Cv Div Sleep Studies  Insufficient sleep time for adequate titration.  Please repeat CPAP study with sleep aide - Lunesta '3mg'$  (disp #1 tablet with no refills).  Take at sleep lab

## 2022-05-21 ENCOUNTER — Encounter: Payer: Self-pay | Admitting: Internal Medicine

## 2022-05-21 ENCOUNTER — Ambulatory Visit: Payer: PPO | Admitting: Internal Medicine

## 2022-05-21 ENCOUNTER — Other Ambulatory Visit: Payer: Self-pay

## 2022-05-21 VITALS — BP 134/76 | Ht 64.0 in | Wt 171.2 lb

## 2022-05-21 DIAGNOSIS — I471 Supraventricular tachycardia: Secondary | ICD-10-CM

## 2022-05-21 DIAGNOSIS — I10 Essential (primary) hypertension: Secondary | ICD-10-CM

## 2022-05-21 DIAGNOSIS — I700 Atherosclerosis of aorta: Secondary | ICD-10-CM

## 2022-05-21 DIAGNOSIS — R002 Palpitations: Secondary | ICD-10-CM | POA: Diagnosis not present

## 2022-05-21 DIAGNOSIS — G4733 Obstructive sleep apnea (adult) (pediatric): Secondary | ICD-10-CM | POA: Diagnosis not present

## 2022-05-21 DIAGNOSIS — E782 Mixed hyperlipidemia: Secondary | ICD-10-CM

## 2022-05-21 NOTE — Patient Instructions (Signed)
Medication Instructions:  Continue your current medications  *If you need a refill on your cardiac medications before your next appointment, please call your pharmacy*   Lab Work: None   Testing/Procedures: None   Follow-Up: At [facility], you and your health needs are our priority.  As part of our continuing mission to provide you with exceptional heart care, we have created designated Provider Care Teams.  These Care Teams include your primary Cardiologist (physician) and Advanced Practice Providers (APPs -  Physician Assistants and Nurse Practitioners) who all work together to provide you with the care you need, when you need it.  We recommend signing up for the patient portal called "MyChart".  Sign up information is provided on this After Visit Summary.  MyChart is used to connect with patients for Virtual Visits (Telemedicine).  Patients are able to view lab/test results, encounter notes, upcoming appointments, etc.  Non-urgent messages can be sent to your provider as well.   To learn more about what you can do with MyChart, go to [email or web address].    Your next appointment:   1 year(s)  The format for your next appointment:   In Person  Provider:   You may see Nelva Bush, MDor one of the following Advanced Practice Providers on your designated Care Team:   Murray Hodgkins, NP Christell Faith, PA-C Cadence Kathlen Mody, PA-C{   Important Information About Sugar

## 2022-05-24 ENCOUNTER — Telehealth: Payer: Self-pay | Admitting: Internal Medicine

## 2022-05-24 NOTE — Telephone Encounter (Signed)
I spoke with the patient. The phone note did not clarify which result she was calling for. Per the patient, she was calling for her sleep study results. I advised her I will have to have our sleep staff call her back to discuss.  The patient voices understanding and is agreeable.

## 2022-05-24 NOTE — Telephone Encounter (Signed)
Patient was calling in for results. Please advise  

## 2022-05-28 MED ORDER — ESZOPICLONE 2 MG PO TABS: 2.0000 mg | ORAL_TABLET | Freq: Every evening | ORAL | 0 refills | Status: DC | PRN

## 2022-05-28 NOTE — Telephone Encounter (Signed)
Reached out to patient lmtcb and Lunesta '3mg'$  (disp #1 tablet with no refills).  Take at sleep lab to be ordered.

## 2022-05-28 NOTE — Addendum Note (Signed)
Addended by: Freada Bergeron on: 05/28/2022 10:47 AM   Modules accepted: Orders

## 2022-06-10 NOTE — Telephone Encounter (Signed)
Left message with husband to have patient call me back.

## 2022-06-10 NOTE — Telephone Encounter (Signed)
Return Call: Patient called back for her sleep result. The patient has been notified of the result and verbalized understanding.  All questions (if any) were answered. Michelle Cisneros, [place] 06/10/2022 3:19 PM    Patient states her husband is having surgery and she will call us back when she is able to continue with her sleep study.

## 2022-06-10 NOTE — Telephone Encounter (Signed)
Left message with husband to have patient return the call.

## 2022-06-10 NOTE — Telephone Encounter (Signed)
Patient states her husband is having surgery and she will call us back when she is able to continue with her sleep study.

## 2022-06-28 ENCOUNTER — Other Ambulatory Visit: Payer: Self-pay

## 2022-06-28 MED ORDER — NITROFURANTOIN MONOHYD MACRO 100 MG PO CAPS: 100.0000 mg | ORAL_CAPSULE | Freq: Every day | ORAL | 1 refills | Status: DC

## 2022-07-28 ENCOUNTER — Other Ambulatory Visit: Payer: Self-pay

## 2022-07-28 MED ORDER — LISINOPRIL 20 MG PO TABS: 20.0000 mg | ORAL_TABLET | Freq: Every day | ORAL | 1 refills | Status: DC

## 2022-07-29 ENCOUNTER — Ambulatory Visit: Payer: PPO | Attending: Family Medicine | Admitting: Physical Therapy

## 2022-07-29 ENCOUNTER — Telehealth: Payer: Self-pay | Admitting: Internal Medicine

## 2022-07-29 DIAGNOSIS — G8929 Other chronic pain: Secondary | ICD-10-CM | POA: Diagnosis not present

## 2022-07-29 DIAGNOSIS — M545 Low back pain, unspecified: Secondary | ICD-10-CM | POA: Insufficient documentation

## 2022-07-29 DIAGNOSIS — R262 Difficulty in walking, not elsewhere classified: Secondary | ICD-10-CM | POA: Insufficient documentation

## 2022-07-29 DIAGNOSIS — R269 Unspecified abnormalities of gait and mobility: Secondary | ICD-10-CM | POA: Insufficient documentation

## 2022-07-29 NOTE — Therapy (Signed)
OUTPATIENT PHYSICAL THERAPY THORACOLUMBAR EVALUATION   Patient Name: Michelle Cisneros MRN: [id number] DOB:01-Mar-1956, 66 y.o., female Today's Date: 07/31/2022   PT End of Session - 07/31/22 1334     Visit Number 1    Number of Visits 17    Date for PT Re-Evaluation 10/01/22    Authorization - Visit Number 1    Authorization - Number of Visits 10    Progress Note Due on Visit 10    PT Start Time 9323    PT Stop Time 1600    PT Time Calculation (min) 45 min    Activity Tolerance Patient tolerated treatment well    Behavior During Therapy WFL for tasks assessed/performed             Past Medical History:  Diagnosis Date   Aortic atherosclerosis ([place])    Arthritis    Back pain    Basal cell carcinoma    L forehead, txted in past by another provider   Collagen vascular disease ([place])    Depression    DOE (dyspnea on exertion)    a. 05/2020 Echo: EF 60-65%, no rwma, nl RV size/fxn.   History of stress test    a. 04/2020 Lexiscan MV: No ischemia/infarct.   Hyperlipemia    Hypertension    Melanoma ([place]) ~2015   R forearm txted in past by another provider   Recurrent UTI    Squamous cell carcinoma of skin    L med ankle, txted in past by another provider   Past Surgical History:  Procedure Laterality Date   ABDOMINAL HYSTERECTOMY     COLONOSCOPY WITH PROPOFOL N/A 05/26/2020   Procedure: COLONOSCOPY WITH PROPOFOL;  Surgeon: [place], Benay Pike, MD;  Location: [facility] ENDOSCOPY;  Service: Gastroenterology;  Laterality: N/A;   INCONTINENCE SURGERY     OOPHORECTOMY     Patient Active Problem List   Diagnosis Date Noted   OSA (obstructive sleep apnea) 04/27/2022   Palpitations 10/03/2021   Dyspnea on exertion 04/19/2020   Aortic atherosclerosis ([place]) 04/19/2020   Essential hypertension 04/19/2020   Hyperlipidemia 04/19/2020   Right carpal tunnel syndrome 11/09/2016    PCP: Maryland Pink MD  REFERRING PROVIDER: Maryland Pink MD  REFERRING DIAG: LBP  Rationale for  Evaluation and Treatment Rehabilitation  THERAPY DIAG:  Chronic midline low back pain without sciatica  ONSET DATE: Chronic  SUBJECTIVE:                                                                                                                                                                                           SUBJECTIVE STATEMENT: Chronic LBP, ISO suggestions to  manage in current exercise regimen  PERTINENT HISTORY:  Pt is familiar with this clinic reporting with midline LBP without sciatica. Reports pain is center of LB. Pt reports starting [employer] a year and a half ago and has been walking more frequently in this time. Through walking more her back pain is reduced from last episode of therapy, but is more concerned that her gait feels off. She is wanting footwear and HEP advisement from PT to mange these symptoms with [employer] program. Current LBP 0/10; worst 6/10. Pain achieves 6/10 with walking over 15-48mns and with standing upright. Is currently completing resistance exercise and walking in the gym 4-5x/week. Pt is retired, watches her 3 grandchildren ages [id number] old. Pt denies N/V, B&B changes, unexplained weight fluctuation, saddle paresthesia, fever, night sweats, or unrelenting night pain at this time.   PAIN:  Are you having pain? Yes: NPRS scale: 0/10 Pain location: midline low  back Pain description: dull ache Aggravating factors: walking >143m, standing upright Relieving factors: sitting/resting   PRECAUTIONS: None  WEIGHT BEARING RESTRICTIONS No  FALLS:  Has patient fallen in last 6 months? No  LIVING ENVIRONMENT: Lives with: lives with their spouse Lives in: House/apartment Stairs: Yes: Internal: 5 steps; on left going up Has following equipment at home: None  OCCUPATION:  retired  PLOF: Independent  PATIENT GOALS walk with more comfort   OBJECTIVE:   DIAGNOSTIC FINDINGS:  Not since last episode  PATIENT SURVEYS:  FOTO 58; goal of  628SCREENING FOR RED FLAGS: Bowel or bladder incontinence: No Spinal tumors: No Cauda equina syndrome: No Compression fracture: No Abdominal aneurysm: No  COGNITION:  Overall cognitive status: Within functional limits for tasks assessed     SENSATION: WFL  MUSCLE LENGTH: Hamstrings: 25% limited bilat with tension at end range Thomas test: shortened bilat   POSTURE: decreased lumbar lordosis, increased thoracic kyphosis, and flexed trunk   PALPATION: TTP concordant pain to palpation of R lumbar paraspinals/QL and bilat superior glute/glute med  LUMBAR ROM:     A/PROM  eval  Flexion WNL  Extension 25% limited  Right lateral flexion WNL  Left lateral flexion WNL  Right rotation   Left rotation    (Blank rows = not tested)  LOWER EXTREMITY ROM:     PROM/ AROM  Right eval Left eval  Hip flexion WNL bilat  Hip extension 10/0d bilat  Hip abduction    Hip adduction    Hip internal rotation Limited 25% bilat  Hip external rotation Limited 25% bilat  Knee flexion    Knee extension    Ankle dorsiflexion    Ankle plantarflexion    Ankle inversion    Ankle eversion     (Blank rows = not tested)  LOWER EXTREMITY MMT:    MMT Right eval Left eval  Hip flexion 5 5  Hip extension 4- 4-  Hip abduction 4 4  Hip adduction    Hip internal rotation 5 5  Hip external rotation 4+ 4+  Knee flexion 4+ 4+  Knee extension 5 5  Ankle dorsiflexion 5 5  Ankle plantarflexion 5 5  Ankle inversion    Ankle eversion     (Blank rows = not tested)  LUMBAR SPECIAL TESTS:  ThMarcello Mooresest: Positive  FUNCTIONAL TESTS:  5 times sit to stand: 13sec 6 minute walk test: 1575 10 meter walk test: >1.39m104mSLS <3sec bilat  GAIT: Distance walked: 1575 Assistive device utilized: None Level of assistance: Complete Independence Comments: concordant pain 4mi45m  into 6MWT; gait with maintained forward flex Steps: step to with unilateral handrail    TODAY'S TREATMENT  PT reviewed the  following HEP with patient with patient able to demonstrate a set of the following with min cuing for correction needed. PT educated patient on parameters of therex (how/when to inc/decrease intensity, frequency, rep/set range, stretch hold time, and purpose of therex) with verbalized understanding.  Bridge 3x 10 2-3x/week Squat 3x 10 2-3x/week Thomas stretch 30sec hold daily SLS with UE support 30sec hold daily   PATIENT EDUCATION:  Education details: Patient was educated on diagnosis, anatomy and pathology involved, prognosis, role of PT, and was given an HEP, demonstrating exercise with proper form following verbal and tactile cues, and was given a paper hand out to continue exercise at home. Pt was educated on and agreed to plan of care.  Person educated: Patient Education method: Explanation, Demonstration, and Handouts Education comprehension: verbalized understanding, returned demonstration, and verbal cues required   HOME EXERCISE PROGRAM: Bridge 3x 10 2-3x/week Squat 3x 10 2-3x/week Thomas stretch 30sec hold daily SLS with support 30sec hold daily  ASSESSMENT:  CLINICAL IMPRESSION: Patient is a 66 y.o. female who was seen today for physical therapy evaluation and treatment for LBP with pertinent PMH of spinal stenosis. Impairments in decreased hip ext mobility, decreased hip ext and core strength, decreased lumbar ext mobility, decreased static balance,increased bilat hip flexor tension, abnormal posture and gait, decreased activity tolerance/endurance, and pain. Activity limitations in prolonged walking, squatting, lifting, carrying; inhibiting heavy ADLs and desired exercise regimen. Would benefit from skilled PT to address above deficits and promote optimal return to PLOF.     OBJECTIVE IMPAIRMENTS Abnormal gait, decreased activity tolerance, decreased balance, decreased coordination, decreased endurance, decreased mobility, difficulty walking, decreased ROM, decreased  strength, increased fascial restrictions, increased muscle spasms, impaired tone, improper body mechanics, postural dysfunction, and pain.   ACTIVITY LIMITATIONS carrying, lifting, bending, squatting, stairs, transfers, and locomotion level  PARTICIPATION LIMITATIONS: meal prep, cleaning, laundry, community activity, occupation, and yard work  PERSONAL FACTORS Age, Fitness, Past/current experiences, Time since onset of injury/illness/exacerbation, and 3+ comorbidities: chronic pain, HLD, HTN  are also affecting patient's functional outcome.   REHAB POTENTIAL: Good  CLINICAL DECISION MAKING: Evolving/moderate complexity  EVALUATION COMPLEXITY: Moderate   GOALS: Goals reviewed with patient? Yes  SHORT TERM GOALS: Target date: 08/28/2022  Pt will be independent with HEP in order to improve strength and balance in order to decrease fall risk and improve function at home and work.  Baseline: 07/29/22 HEP given  Goal status: INITIAL    LONG TERM GOALS: Target date: 09/25/2022  Pt will increase 6MWT by at least 39m(1680f in order to demonstrate clinically significant improvement in cardiopulmonary endurance and community ambulation   Baseline: 8/10/233 1575 Goal status: INITIAL  2.  Pt will decrease worst pain as reported on NPRS by at least 3 points in order to demonstrate clinically significant reduction in pain.  Baseline: 07/29/22 Goal status: INITIAL  3.  Patient will increase FOTO score to 62 to demonstrate predicted increase in functional mobility to complete ADLs  Baseline: 07/29/22 58 Goal status: INITIAL   PLAN: PT FREQUENCY: 1-2x/week  PT DURATION: 8 weeks  PLANNED INTERVENTIONS: Therapeutic exercises, Therapeutic activity, Neuromuscular re-education, Balance training, Gait training, Patient/Family education, Self Care, Joint mobilization, Joint manipulation, Stair training, Dry Needling, Electrical stimulation, Spinal manipulation, Spinal mobilization, Cryotherapy,  Moist heat, Traction, Ultrasound, Fluidotherapy, Manual therapy, and Re-evaluation.  PLAN FOR NEXT SESSION: HEP review, build onto  a gym HEP  Durwin Reges DPT  [place], PT 07/31/2022, 8:25 PM

## 2022-07-29 NOTE — Telephone Encounter (Signed)
*  STAT* If patient is at the pharmacy, call can be transferred to refill team.   1. Which medications need to be refilled? (please list name of each medication and dose if known)   lisinopril (ZESTRIL) 20 MG tablet    2. Which pharmacy/location (including street and city if local pharmacy) is medication to be sent to? [facility] - [place], [place]  3. Do they need a 30 day or 90 day supply?  90 day

## 2022-07-29 NOTE — Telephone Encounter (Signed)
Called patient to inform her that a prescription for lisinopril was sent 07/28/2022 to [facility]. Patient verbalized understanding.

## 2022-07-31 ENCOUNTER — Encounter: Payer: Self-pay | Admitting: Physical Therapy

## 2022-08-04 ENCOUNTER — Ambulatory Visit: Payer: PPO | Admitting: Physical Therapy

## 2022-08-12 ENCOUNTER — Ambulatory Visit: Payer: PPO | Admitting: Physical Therapy

## 2022-08-15 ENCOUNTER — Emergency Department: Payer: PPO

## 2022-08-15 ENCOUNTER — Other Ambulatory Visit: Payer: Self-pay

## 2022-08-15 DIAGNOSIS — M21942 Unspecified acquired deformity of hand, left hand: Secondary | ICD-10-CM | POA: Diagnosis not present

## 2022-08-15 DIAGNOSIS — W01198A Fall on same level from slipping, tripping and stumbling with subsequent striking against other object, initial encounter: Secondary | ICD-10-CM | POA: Insufficient documentation

## 2022-08-15 DIAGNOSIS — Z87891 Personal history of nicotine dependence: Secondary | ICD-10-CM | POA: Insufficient documentation

## 2022-08-15 DIAGNOSIS — Z7982 Long term (current) use of aspirin: Secondary | ICD-10-CM | POA: Diagnosis not present

## 2022-08-15 DIAGNOSIS — Z85828 Personal history of other malignant neoplasm of skin: Secondary | ICD-10-CM | POA: Diagnosis not present

## 2022-08-15 DIAGNOSIS — Z79899 Other long term (current) drug therapy: Secondary | ICD-10-CM | POA: Diagnosis not present

## 2022-08-15 DIAGNOSIS — G919 Hydrocephalus, unspecified: Secondary | ICD-10-CM | POA: Diagnosis not present

## 2022-08-15 DIAGNOSIS — S0993XA Unspecified injury of face, initial encounter: Secondary | ICD-10-CM | POA: Diagnosis not present

## 2022-08-15 DIAGNOSIS — Z85820 Personal history of malignant melanoma of skin: Secondary | ICD-10-CM | POA: Insufficient documentation

## 2022-08-15 DIAGNOSIS — I2510 Atherosclerotic heart disease of native coronary artery without angina pectoris: Secondary | ICD-10-CM | POA: Diagnosis not present

## 2022-08-15 DIAGNOSIS — T1490XA Injury, unspecified, initial encounter: Secondary | ICD-10-CM | POA: Diagnosis not present

## 2022-08-15 DIAGNOSIS — G918 Other hydrocephalus: Secondary | ICD-10-CM | POA: Diagnosis not present

## 2022-08-15 DIAGNOSIS — R296 Repeated falls: Principal | ICD-10-CM | POA: Insufficient documentation

## 2022-08-15 DIAGNOSIS — R2681 Unsteadiness on feet: Secondary | ICD-10-CM | POA: Diagnosis present

## 2022-08-15 DIAGNOSIS — M19042 Primary osteoarthritis, left hand: Secondary | ICD-10-CM | POA: Diagnosis not present

## 2022-08-15 DIAGNOSIS — S6992XA Unspecified injury of left wrist, hand and finger(s), initial encounter: Secondary | ICD-10-CM | POA: Diagnosis not present

## 2022-08-15 NOTE — ED Triage Notes (Signed)
Pt states she slipped this am hitting face on floor and injured left hand/wrist. Pt denies neck pain, denies loc. Pt with abrasion noted to nasal bridge.

## 2022-08-16 ENCOUNTER — Inpatient Hospital Stay: Payer: PPO

## 2022-08-16 ENCOUNTER — Observation Stay
Admission: EM | Admit: 2022-08-16 | Discharge: 2022-08-16 | Disposition: A | Discharge status: Home / Self Care | Payer: PPO | Attending: Internal Medicine | Admitting: Internal Medicine

## 2022-08-16 DIAGNOSIS — I10 Essential (primary) hypertension: Secondary | ICD-10-CM | POA: Diagnosis not present

## 2022-08-16 DIAGNOSIS — G918 Other hydrocephalus: Secondary | ICD-10-CM | POA: Diagnosis not present

## 2022-08-16 DIAGNOSIS — E785 Hyperlipidemia, unspecified: Secondary | ICD-10-CM | POA: Diagnosis present

## 2022-08-16 DIAGNOSIS — R296 Repeated falls: Secondary | ICD-10-CM

## 2022-08-16 DIAGNOSIS — G919 Hydrocephalus, unspecified: Secondary | ICD-10-CM

## 2022-08-16 DIAGNOSIS — W19XXXA Unspecified fall, initial encounter: Secondary | ICD-10-CM

## 2022-08-16 DIAGNOSIS — F32A Depression, unspecified: Secondary | ICD-10-CM

## 2022-08-16 LAB — BASIC METABOLIC PANEL
Anion gap: 10 (ref 5–15)
Anion gap: 8 (ref 5–15)
BUN: 28 mg/dL — ABNORMAL HIGH (ref 8–23)
BUN: 24 mg/dL — ABNORMAL HIGH (ref 8–23)
CO2: 22 mmol/L (ref 22–32)
CO2: 24 mmol/L (ref 22–32)
Calcium: 9 mg/dL (ref 8.9–10.3)
Calcium: 8.9 mg/dL (ref 8.9–10.3)
Chloride: 104 mmol/L (ref 98–111)
Chloride: 107 mmol/L (ref 98–111)
Creatinine, Ser: 0.66 mg/dL (ref 0.44–1.00)
Creatinine, Ser: 0.57 mg/dL (ref 0.44–1.00)
GFR, Estimated: >60 mL/min (ref >60)
GFR, Estimated: >60 mL/min (ref >60)
Glucose, Bld: 117 mg/dL — ABNORMAL HIGH (ref 70–99)
Glucose, Bld: 105 mg/dL — ABNORMAL HIGH (ref 70–99)
Potassium: 3.7 mmol/L (ref 3.5–5.1)
Potassium: 4.2 mmol/L (ref 3.5–5.1)
Sodium: 136 mmol/L (ref 135–145)
Sodium: 139 mmol/L (ref 135–145)

## 2022-08-16 LAB — URINALYSIS, ROUTINE W REFLEX MICROSCOPIC
Bacteria, UA: NONE SEEN
Bilirubin Urine: NEGATIVE
Glucose, UA: NEGATIVE mg/dL
Ketones, ur: NEGATIVE mg/dL
Nitrite: NEGATIVE
Protein, ur: NEGATIVE mg/dL
Specific Gravity, Urine: 1.011 (ref 1.005–1.030)
pH: 5 (ref 5.0–8.0)

## 2022-08-16 LAB — CBC WITH DIFFERENTIAL/PLATELET
Abs Immature Granulocytes: 0.02 10*3/uL (ref 0.00–0.07)
Basophils Absolute: 0 10*3/uL (ref 0.0–0.1)
Basophils Relative: 1 %
Eosinophils Absolute: 0.2 10*3/uL (ref 0.0–0.5)
Eosinophils Relative: 3 %
HCT: 43.4 % (ref 36.0–46.0)
Hemoglobin: 14.3 g/dL (ref 12.0–15.0)
Immature Granulocytes: 0 %
Lymphocytes Relative: 16 %
Lymphs Abs: 1.3 10*3/uL (ref 0.7–4.0)
MCH: 30.3 pg (ref 26.0–34.0)
MCHC: 32.9 g/dL (ref 30.0–36.0)
MCV: 91.9 fL (ref 80.0–100.0)
Monocytes Absolute: 0.6 10*3/uL (ref 0.1–1.0)
Monocytes Relative: 8 %
Neutro Abs: 5.7 10*3/uL (ref 1.7–7.7)
Neutrophils Relative %: 72 %
Platelets: 239 10*3/uL (ref 150–400)
RBC: 4.72 MIL/uL (ref 3.87–5.11)
RDW: 12.6 % (ref 11.5–15.5)
WBC: 7.9 10*3/uL (ref 4.0–10.5)
nRBC: 0 % (ref 0.0–0.2)

## 2022-08-16 LAB — CBC
HCT: 40.3 % (ref 36.0–46.0)
Hemoglobin: 13.3 g/dL (ref 12.0–15.0)
MCH: 30.1 pg (ref 26.0–34.0)
MCHC: 33 g/dL (ref 30.0–36.0)
MCV: 91.2 fL (ref 80.0–100.0)
Platelets: 232 10*3/uL (ref 150–400)
RBC: 4.42 MIL/uL (ref 3.87–5.11)
RDW: 12.6 % (ref 11.5–15.5)
WBC: 6.9 10*3/uL (ref 4.0–10.5)
nRBC: 0 % (ref 0.0–0.2)

## 2022-08-16 LAB — HIV ANTIBODY (ROUTINE TESTING W REFLEX): HIV Screen 4th Generation wRfx: NONREACTIVE

## 2022-08-16 MED ORDER — SODIUM CHLORIDE 0.9 % IV SOLN: INTRAVENOUS | Status: DC

## 2022-08-16 MED ORDER — NITROFURANTOIN MONOHYD MACRO 100 MG PO CAPS: 100.0000 mg | ORAL_CAPSULE | Freq: Every day | ORAL | Status: DC

## 2022-08-16 MED ORDER — ATORVASTATIN CALCIUM 20 MG PO TABS
10.0000 mg | ORAL_TABLET | Freq: Every day | ORAL | Status: DC
  Administered 2022-08-16: 10 mg via ORAL
  Filled 2022-08-16: qty 1

## 2022-08-16 MED ORDER — ACETAMINOPHEN 325 MG RE SUPP: 650.0000 mg | Freq: Four times a day (QID) | RECTAL | Status: DC | PRN

## 2022-08-16 MED ORDER — ENOXAPARIN SODIUM 40 MG/0.4ML IJ SOSY
40.0000 mg | PREFILLED_SYRINGE | INTRAMUSCULAR | Status: DC
  Administered 2022-08-16: 40 mg via SUBCUTANEOUS
  Filled 2022-08-16: qty 0.4

## 2022-08-16 MED ORDER — TRAZODONE HCL 50 MG PO TABS: 25.0000 mg | ORAL_TABLET | Freq: Every evening | ORAL | Status: DC | PRN

## 2022-08-16 MED ORDER — OYSTER SHELL CALCIUM/D3 500-5 MG-MCG PO TABS
1.0000 | ORAL_TABLET | Freq: Every day | ORAL | Status: DC
  Administered 2022-08-16: 1 via ORAL
  Filled 2022-08-16: qty 1

## 2022-08-16 MED ORDER — MAGNESIUM HYDROXIDE 400 MG/5ML PO SUSP: 30.0000 mL | Freq: Every day | ORAL | Status: DC | PRN

## 2022-08-16 MED ORDER — GADOBUTROL 1 MMOL/ML IV SOLN
7.0000 mL | Freq: Once | INTRAVENOUS | Status: AC | PRN
Start: 2022-08-16 — End: 2022-08-16
  Administered 2022-08-16: 7 mL via INTRAVENOUS

## 2022-08-16 MED ORDER — FLUTICASONE PROPIONATE 50 MCG/ACT NA SUSP: 2.0000 | Freq: Every day | NASAL | Status: DC

## 2022-08-16 MED ORDER — IBUPROFEN 600 MG PO TABS
600.0000 mg | ORAL_TABLET | Freq: Once | ORAL | Status: AC
  Administered 2022-08-16: 600 mg via ORAL
  Filled 2022-08-16: qty 1

## 2022-08-16 MED ORDER — BUPROPION HCL ER (XL) 150 MG PO TB24
300.0000 mg | ORAL_TABLET | Freq: Every day | ORAL | Status: DC
  Administered 2022-08-16: 300 mg via ORAL
  Filled 2022-08-16: qty 2

## 2022-08-16 MED ORDER — ONDANSETRON HCL 4 MG PO TABS: 4.0000 mg | ORAL_TABLET | Freq: Four times a day (QID) | ORAL | Status: DC | PRN

## 2022-08-16 MED ORDER — ONDANSETRON HCL 4 MG/2ML IJ SOLN: 4.0000 mg | Freq: Four times a day (QID) | INTRAMUSCULAR | Status: DC | PRN

## 2022-08-16 MED ORDER — ACETAMINOPHEN 325 MG PO TABS: 650.0000 mg | ORAL_TABLET | Freq: Four times a day (QID) | ORAL | Status: DC | PRN

## 2022-08-16 MED ORDER — LISINOPRIL 10 MG PO TABS
20.0000 mg | ORAL_TABLET | Freq: Every day | ORAL | Status: DC
  Administered 2022-08-16: 20 mg via ORAL
  Filled 2022-08-16: qty 2

## 2022-08-16 NOTE — Discharge Summary (Signed)
Physician Discharge Summary  Michelle Cisneros HFW:[id number] DOB: 15-Sep-1956 DOA: 08/16/2022  PCP: Maryland Pink, MD  Admit date: 08/16/2022 Discharge date: 08/16/2022  Admitted From: Home Disposition: Home  Recommendations for Outpatient Follow-up:  Follow up with PCP in 1-2 weeks Send referral to neurosurgery for follow-up.  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  Left hand contusion and left frontal forehead abrasion and contusion.  Skeletal survey was negative for any fractures or dislocations.  CT head showed accidental finding of very large hydrocephalus. She was admitted to the hospital for monitoring and neurosurgery evaluation. Skeletal survey negative, mobilize with PT OT with no additional needs. Seen by neurosurgery for very large impressive hydrocephalus on her CT head and MRI of the brain and this was thought to be chronic and possibly not contributing to current symptoms.  Since patient is stable, she will be going home today, neurosurgery recommended elective follow-up and possible ventriculostomy in the future.  Patient was admitted with anticipated surgical intervention, however this was decided to be done as outpatient.  With patient's quick recovery, she is going home today. No changes were done on her home medication list. Urine analysis was normal.    Discharge Diagnoses:  Principal Problem:   Recurrent falls Active Problems:   Hydrocephalus [facility])   Essential hypertension   Hyperlipidemia   Depression    Discharge Instructions  Discharge Instructions     Ambulatory referral to Neurosurgery   Complete by: As directed    Diet - low sodium heart healthy   Complete by: As directed     Increase activity slowly   Complete by: As directed       Allergies as of 08/16/2022   No Known Allergies      Medication List     STOP taking these medications    eszopiclone 2 MG Tabs tablet Commonly known as: LUNESTA       TAKE these medications    aspirin EC 81 MG tablet Take 1 tablet (81 mg total) by mouth daily.   atorvastatin 10 MG tablet Commonly known as: LIPITOR TAKE ONE TABLET BY MOUTH DAILY   buPROPion 300 MG 24 hr tablet Commonly known as: WELLBUTRIN XL TAKE ONE TABLET BY MOUTH DAILY   Calcium Carbonate-Vitamin D 600-400 MG-UNIT tablet Take 1 tablet by mouth daily.   fluticasone 50 MCG/ACT nasal spray Commonly known as: FLONASE Place 2 sprays into both nostrils daily.   lisinopril 20 MG tablet Commonly known as: ZESTRIL Take 1 tablet (20 mg total) by mouth daily.   metroNIDAZOLE 0.75 % cream Commonly known as: METROCREAM Apply to face once to twice daily for rosacea.   nitrofurantoin (macrocrystal-monohydrate) 100 MG capsule Commonly known as: MACROBID Take 1 capsule (100 mg total) by mouth daily.        No Known Allergies  Consultations: Neurosurgery   Procedures/Studies: MR BRAIN W WO CONTRAST  Result Date: 08/16/2022 CLINICAL DATA:  Hydrocephalus.  Falls. EXAM: MRI HEAD WITHOUT AND WITH CONTRAST TECHNIQUE: Multiplanar, multiecho pulse sequences of the brain and surrounding structures were obtained without and with intravenous contrast. CONTRAST:  52m GADAVIST GADOBUTROL 1 MMOL/ML IV SOLN COMPARISON:  Head CT 08/15/2022 FINDINGS: Brain: There is no evidence of an acute infarct, intracranial hemorrhage, mass, midline shift, or extra-axial fluid collection. As seen on  the recent CT, there is unchanged severe dilatation of the lateral ventricles with absence of the septum pellucidum. The third ventricle is slightly enlarged, while the fourth ventricle is normal in size. The corpus callosum is present but uplifted and diffusely thinned.  There is partial fusion of the thalami. The falx appears grossly intact. Small T2 hyperintensities in the cerebral white matter bilaterally are nonspecific but compatible with mild chronic small vessel ischemic disease. No confluent periventricular white matter signal abnormality is seen to indicate transependymal CSF flow. No abnormal enhancement is identified. The cerebellar tonsils are normally positioned. Vascular: Major intracranial vascular flow voids are preserved. 1 cm right MCA bifurcation aneurysm. Skull and upper cervical spine: Unremarkable bone marrow signal. Sinuses/Orbits: Unremarkable orbits. Paranasal sinuses and mastoid air cells are clear. Other: None. IMPRESSION: 1. No acute intracranial abnormality. 2. 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 3. 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 4. 1 cm right MCA bifurcation aneurysm. Electronically Signed   By: Logan Bores M.D.   On: 08/16/2022 08:46   CT HEAD WO CONTRAST (5MM)  Result Date: 08/15/2022 CLINICAL DATA:  Facial trauma EXAM: CT HEAD WITHOUT CONTRAST CT MAXILLOFACIAL WITHOUT CONTRAST TECHNIQUE: Multidetector CT imaging of the head and maxillofacial structures were performed using the standard protocol without intravenous contrast. Multiplanar CT image reconstructions of the maxillofacial structures were also generated. RADIATION DOSE REDUCTION: This exam was performed according to the departmental dose-optimization program which includes automated exposure control, adjustment of the mA and/or kV according to patient size and/or use of iterative reconstruction technique. COMPARISON:  None Available. FINDINGS: CT HEAD FINDINGS Brain: There is severe hydrocephalus of the lateral ventricles. Extra-axial CSF spaces are normal. No acute hemorrhage. Septum pellucidum is not visible and likely absent. 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 Vascular: No hyperdense vessel or unexpected vascular calcification. Skull: The visualized skull base, calvarium and extracranial soft tissues are normal. CT MAXILLOFACIAL FINDINGS Osseous: No facial fracture Orbits: The globes and optic nerves are intact. Normal extraocular muscles and intraorbital fat. Sinuses: No acute finding. Soft tissues: Normal visualized extracranial soft tissues. IMPRESSION: 1. 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 No acute hemorrhage. 2. No facial fracture. Electronically Signed   By: Ulyses Jarred M.D.   On: 08/15/2022 21:12   CT Maxillofacial Wo Contrast  Result Date: 08/15/2022 CLINICAL DATA:  Facial trauma EXAM: CT HEAD WITHOUT CONTRAST CT MAXILLOFACIAL WITHOUT CONTRAST TECHNIQUE: Multidetector CT imaging of the head and maxillofacial structures were performed using the standard protocol without intravenous contrast. Multiplanar CT image reconstructions of the maxillofacial structures were also generated. RADIATION DOSE REDUCTION: This exam was performed according to the departmental dose-optimization program which includes automated exposure control, adjustment of the mA and/or kV according to patient size and/or use of iterative reconstruction technique. COMPARISON:  None Available. FINDINGS: CT HEAD FINDINGS Brain: There is severe hydrocephalus of the lateral ventricles. Extra-axial CSF spaces are normal. No acute hemorrhage. Septum pellucidum is not visible and likely absent. 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 Vascular: No hyperdense vessel or unexpected vascular calcification. Skull: The visualized skull base, calvarium and extracranial soft tissues are normal. CT MAXILLOFACIAL FINDINGS Osseous: No facial fracture Orbits: The globes and optic nerves are intact. Normal extraocular muscles and intraorbital fat. Sinuses: No acute finding. Soft tissues: Normal visualized extracranial soft tissues. IMPRESSION: 1. Severe chronic hydrocephalus  of the lateral ventricles. No acute hemorrhage. 2. No facial fracture. Electronically  Signed   By: Ulyses Jarred M.D.   On: 08/15/2022 21:12   CT Cervical Spine Wo Contrast  Result Date: 08/15/2022 CLINICAL DATA:  Trauma. EXAM: CT CERVICAL SPINE WITHOUT CONTRAST TECHNIQUE: Multidetector CT imaging of the cervical spine was performed without intravenous contrast. Multiplanar CT image reconstructions were also generated. RADIATION DOSE REDUCTION: This exam was performed according to the departmental dose-optimization program which includes automated exposure control, adjustment of the mA and/or kV according to patient size and/or use of iterative reconstruction technique. COMPARISON:  None Available. FINDINGS: Alignment: Normal. Skull base and vertebrae: No acute fracture. No primary bone lesion or focal pathologic process. Soft tissues and spinal canal: No prevertebral fluid or swelling. No visible canal hematoma. Disc levels: There is mild disc space narrowing and endplate osteophyte formation throughout the cervical spine most significant at C5-C6, C6-C7 and C7-T1. There is some mild right-sided neural foraminal stenosis at C5-C6 secondary to uncovertebral spurring. There is no severe central canal stenosis at any level. Upper chest: Negative. Other: There is a partially calcified right thyroid nodule measuring 13 mm. IMPRESSION: 1. No acute fracture or traumatic subluxation of the cervical spine. 2. 1.3 cm incidental right thyroid nodule. No follow-up imaging is recommended. Reference: J Am Coll Radiol. 2015 Feb;12(2): 143-50 Electronically Signed   By: Ronney Asters M.D.   On: 08/15/2022 21:08   DG Wrist Complete Left  Result Date: 08/15/2022 CLINICAL DATA:  Status post trauma. EXAM: LEFT WRIST - COMPLETE 3+ VIEW COMPARISON:  None Available. FINDINGS: There is no evidence of an acute fracture or dislocation. Moderate to marked severity chronic and degenerative changes are seen involving the first  carpometacarpal articulation. A chronic deformity of the adjacent portion of the trapezium bone is noted. Soft tissues are unremarkable. IMPRESSION: 1. No acute osseous abnormality. 2. Moderate to marked severity chronic and degenerative changes of the first carpometacarpal articulation. Electronically Signed   By: Virgina Norfolk M.D.   On: 08/15/2022 21:00   DG Hand Complete Left  Result Date: 08/15/2022 CLINICAL DATA:  Status post trauma. EXAM: LEFT HAND - COMPLETE 3+ VIEW COMPARISON:  None Available. FINDINGS: There is no evidence of an acute fracture or dislocation. A chronic deformity is seen involving the distal aspect of the third left metacarpal. Degenerative changes are seen along the carpometacarpal articulation of the left thumb. Soft tissues are unremarkable. IMPRESSION: 1. No acute fracture or dislocation. 2. Chronic deformity of the distal third left metacarpal. Electronically Signed   By: Virgina Norfolk M.D.   On: 08/15/2022 20:58   (Echo, Carotid, EGD, Colonoscopy, ERCP)    Subjective: Patient was seen and examined.  Daughter was at the bedside.  Detailed discussion about radiology findings, later discussed with neurosurgery.  Comfortable going home with outpatient follow-up plans. Left hand is swollen but nontender.   Discharge Exam: Vitals:   08/16/22 1000 08/16/22 1030  BP: (!) 154/78 (!) 143/88  Pulse: 63 65  Resp:    Temp:    SpO2: 99% 100%   Vitals:   08/16/22 0900 08/16/22 0930 08/16/22 1000 08/16/22 1030  BP: (!) 109/96 103/89 (!) 154/78 (!) 143/88  Pulse: 63  63 65  Resp:      Temp:      TempSrc:      SpO2: 100%  99% 100%  Weight:      Height:        General: Pt is alert, awake, not in acute distress Patient has some bruises on her nose. Left hand is  swollen, nontender, joint range of motion is normal. Cardiovascular: RRR, S1/S2 +, no rubs, no gallops Respiratory: CTA bilaterally, no wheezing, no rhonchi Abdominal: Soft, NT, ND, bowel sounds  + Extremities: no edema, no cyanosis    The results of significant diagnostics from this hospitalization (including imaging, microbiology, ancillary and laboratory) are listed below for reference.     Microbiology: No results found for this or any previous visit (from the past 240 hour(s)).   Labs: BNP (last 3 results) No results for input(s): "BNP" in the last 8760 hours. Basic Metabolic Panel: Recent Labs  Lab 08/16/22 0317 08/16/22 0514  NA 136 139  K 3.7 4.2  CL 104 107  CO2 22 24  GLUCOSE 117* 105*  BUN 28* 24*  CREATININE 0.66 0.57  CALCIUM 9.0 8.9   Liver Function Tests: No results for input(s): "AST", "ALT", "ALKPHOS", "BILITOT", "PROT", "ALBUMIN" in the last 168 hours. No results for input(s): "LIPASE", "AMYLASE" in the last 168 hours. No results for input(s): "AMMONIA" in the last 168 hours. CBC: Recent Labs  Lab 08/16/22 0317 08/16/22 0514  WBC 7.9 6.9  NEUTROABS 5.7  --   HGB 14.3 13.3  HCT 43.4 40.3  MCV 91.9 91.2  PLT 239 232   Cardiac Enzymes: No results for input(s): "CKTOTAL", "CKMB", "CKMBINDEX", "TROPONINI" in the last 168 hours. BNP: Invalid input(s): "POCBNP" CBG: No results for input(s): "GLUCAP" in the last 168 hours. D-Dimer No results for input(s): "DDIMER" in the last 72 hours. Hgb A1c No results for input(s): "HGBA1C" in the last 72 hours. Lipid Profile No results for input(s): "CHOL", "HDL", "LDLCALC", "TRIG", "CHOLHDL", "LDLDIRECT" in the last 72 hours. Thyroid function studies No results for input(s): "TSH", "T4TOTAL", "T3FREE", "THYROIDAB" in the last 72 hours.  Invalid input(s): "FREET3" Anemia work up No results for input(s): "VITAMINB12", "FOLATE", "FERRITIN", "TIBC", "IRON", "RETICCTPCT" in the last 72 hours. Urinalysis    Component Value Date/Time   COLORURINE STRAW (A) 08/16/2022 0317   APPEARANCEUR CLEAR (A) 08/16/2022 0317   APPEARANCEUR Hazy (A) 08/10/2019 0914   LABSPEC 1.011 08/16/2022 0317   PHURINE 5.0  08/16/2022 0317   GLUCOSEU NEGATIVE 08/16/2022 0317   HGBUR SMALL (A) 08/16/2022 0317   BILIRUBINUR NEGATIVE 08/16/2022 0317   BILIRUBINUR Negative 08/10/2019 0914   KETONESUR NEGATIVE 08/16/2022 0317   PROTEINUR NEGATIVE 08/16/2022 0317   NITRITE NEGATIVE 08/16/2022 0317   LEUKOCYTESUR SMALL (A) 08/16/2022 0317   Sepsis Labs Recent Labs  Lab 08/16/22 0317 08/16/22 0514  WBC 7.9 6.9   Microbiology No results found for this or any previous visit (from the past 240 hour(s)).   Time coordinating discharge: 28 minutes  SIGNED:   Barb Merino, MD  [facility] 08/16/2022, 12:24 PM

## 2022-08-16 NOTE — Assessment & Plan Note (Signed)
-   The patient has subsequent left forehead and nasal abrasion and contusion as well as left wrist swelling with pulm contusion. - The patient will be admitted to a medical telemetry bed. - PT consult will be obtained to assess her ambulation. - A brain MRI with and without contrast will be performed to assess for cerebral or CVA as well as her large hydrocephalus.Marland Kitchen

## 2022-08-16 NOTE — ED Notes (Signed)
Pt and family updated on wait times.

## 2022-08-16 NOTE — Consult Note (Signed)
Referring Physician:  No referring provider defined for this encounter.  Primary Physician:  Maryland Pink, MD  History of Present Illness: 08/16/2022 Ms. Michelle Cisneros is here today with a chief complaint of falls.  She has been having more falls lately.  She has also had a mildly altered mental status, though the patient denies this.  She had a mechanical fall in her home, and has a facial abrasion.  She reports chronic urinary incontinence.    She feels slightly off balance.  During childhood, she was noted to have a large head circumference. Her mother told her later that doctors were worried she had encephalitis.  Review of Systems:  A 10 point review of systems is negative, except for the pertinent positives and negatives detailed in the HPI.  Past Medical History: Past Medical History:  Diagnosis Date   Aortic atherosclerosis (HCC)    Arthritis    Back pain    Basal cell carcinoma    L forehead, txted in past by another provider   Collagen vascular disease ([place])    Depression    DOE (dyspnea on exertion)    a. 05/2020 Echo: EF 60-65%, no rwma, nl RV size/fxn.   History of stress test    a. 04/2020 Lexiscan MV: No ischemia/infarct.   Hyperlipemia    Hypertension    Melanoma ([place]) ~2015   R forearm txted in past by another provider   Recurrent UTI    Squamous cell carcinoma of skin    L med ankle, txted in past by another provider    Past Surgical History: Past Surgical History:  Procedure Laterality Date   ABDOMINAL HYSTERECTOMY     COLONOSCOPY WITH PROPOFOL N/A 05/26/2020   Procedure: COLONOSCOPY WITH PROPOFOL;  Surgeon: Toledo, Benay Pike, MD;  Location: [facility] ENDOSCOPY;  Service: Gastroenterology;  Laterality: N/A;   INCONTINENCE SURGERY     OOPHORECTOMY      Allergies: Allergies as of 08/15/2022   (No Known Allergies)    Medications: Current Meds  Medication Sig   aspirin EC 81 MG tablet Take 1 tablet (81 mg total) by mouth daily.    atorvastatin (LIPITOR) 10 MG tablet TAKE ONE TABLET BY MOUTH DAILY   buPROPion (WELLBUTRIN XL) 300 MG 24 hr tablet TAKE ONE TABLET BY MOUTH DAILY   Calcium Carbonate-Vitamin D 600-400 MG-UNIT tablet Take 1 tablet by mouth daily.   fluticasone (FLONASE) 50 MCG/ACT nasal spray Place 2 sprays into both nostrils daily.   lisinopril (ZESTRIL) 20 MG tablet Take 1 tablet (20 mg total) by mouth daily.   nitrofurantoin, macrocrystal-monohydrate, (MACROBID) 100 MG capsule Take 1 capsule (100 mg total) by mouth daily.    Social History: Social History   Tobacco Use   Smoking status: Former    Packs/day: 1.00    Years: 30.00    Total pack years: 30.00    Types: Cigarettes    Quit date: 10/04/1987    Years since quitting: 34.8   Smokeless tobacco: Never  Vaping Use   Vaping Use: Never used  Substance Use Topics   Alcohol use: Yes    Comment: occassional; once glass of wine   Drug use: Not Currently    Family Medical History: Family History  Problem Relation Age of Onset   Cancer Father        laryngeal   Atrial fibrillation Father    Stroke Mother    Breast cancer Neg Hx    Bladder Cancer Neg Hx  Kidney cancer Neg Hx     Physical Examination: Vitals:   08/16/22 1000 08/16/22 1030  BP: (!) 154/78 (!) 143/88  Pulse: 63 65  Resp:    Temp:    SpO2: 99% 100%    General: Patient is well developed, well nourished, calm, collected, and in no apparent distress. Attention to examination is appropriate.  Neck:   Supple.  Full range of motion.  Respiratory: Patient is breathing without any difficulty.   NEUROLOGICAL:     Awake, alert, oriented to person, place, and time.  Speech is clear and fluent. Fund of knowledge is appropriate.   Cranial Nerves: Pupils equal round and reactive to light.  Facial tone is symmetric.  Facial sensation is symmetric. Shoulder shrug is symmetric. Tongue protrusion is midline.  There is no pronator drift.    Strength: Side Biceps Triceps  Deltoid Interossei Grip Wrist Ext. Wrist Flex.  R '5 5 5 5 5 5 5  '$ L '5 5 5 5 5 5 5   '$ Side Iliopsoas Quads Hamstring PF DF EHL  R '5 5 5 5 5 5  '$ L '5 5 5 5 5 5   '$ Reflexes are 1+ and symmetric at the biceps, triceps, brachioradialis, patella and achilles.   Hoffman's is absent.  Bilateral upper and lower extremity sensation is intact to light touch.     Gait is untested  Medical Decision Making  Imaging: MRI Brain 08/16/2022 IMPRESSION: 1. No acute intracranial abnormality. 2. 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 3. 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 4. 1 cm right MCA bifurcation aneurysm.     Electronically Signed   By: Logan Bores M.D.   On: 08/16/2022 08:46  I have personally reviewed the images and agree with the above interpretation.  Assessment and Plan: Ms. Michelle Cisneros is a pleasant 66 y.o. 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   She has imaging features that suggest chronic hydrocephalus, as well as history that suggests enlarged head circumference from childhood.  We discussed the options, though lumbar drain trial and high volume lumbar puncture do not seem feasible.  If her symptoms worsen, I think endoscopic third ventriculostomy is a possibility, but that would be done on an elective basis.  She and her daughter expressed understanding. They will contact my office if they'd like to pursue treatment.   Thank you for involving me in the care of this patient.      Rhys Anchondo K. Izora Ribas MD, [facility] Neurosurgery

## 2022-08-16 NOTE — TOC Progression Note (Signed)
Transition of Care [facility]) - Progression Note    Patient Details  Name: Michelle Cisneros MRN: [id number] Date of Birth: 07-30-1956  Transition of Care [facility]) CM/SW Contact  Shelbie Hutching, RN Phone Number: 08/16/2022, 1:45 PM  Clinical Narrative:     Patient and family would like to file a grievance before leaving the hospital, they have the number for [facility] Patient satisfaction.  ED Charge RN called patient advocate Karen Kitchens to come and speak with patient and family.         Expected Discharge Plan and Services           Expected Discharge Date: 08/16/22                                     Social Determinants of Health (SDOH) Interventions    Readmission Risk Interventions     No data to display

## 2022-08-16 NOTE — TOC Initial Note (Signed)
Transition of Care [facility]) - Initial/Assessment Note    Patient Details  Name: Michelle Cisneros MRN: [id number] Date of Birth: 20-Apr-1956  Transition of Care [facility]) CM/SW Contact:    Michelle Hutching, RN Phone Number: 08/16/2022, 1:15 PM  Clinical Narrative:                 RNCM met with patient and her daughter at the bedside to give MOON Code 44 notice of Inpatient status changed to Observation.   Patient's daughter would like to express that the only reason they agreed to complete the workup yesterday with CT and MRI was with the understanding that patient was admitted as an inpatient. RNCM explained that patient was admitted but then once reviewed by UR did not meet that criteria, daughter Michelle Cisneros understanding.  If they get a bill from the hospital they will be appealing with [employer].          Patient Goals and CMS Choice        Expected Discharge Plan and Services           Expected Discharge Date: 08/16/22                                    Prior Living Arrangements/Services                       Activities of Daily Living      Permission Sought/Granted                  Emotional Assessment              Admission diagnosis:  Recurrent falls [R29.6] Patient Active Problem List   Diagnosis Date Noted   Recurrent falls 08/16/2022   Hydrocephalus ([place]) 08/16/2022   Depression 08/16/2022   OSA (obstructive sleep apnea) 04/27/2022   Palpitations 10/03/2021   Dyspnea on exertion 04/19/2020   Aortic atherosclerosis ([place]) 04/19/2020   Essential hypertension 04/19/2020   Hyperlipidemia 04/19/2020   Right carpal tunnel syndrome 11/09/2016   PCP:  Michelle Pink, MD Pharmacy:   [facility] ([address], [place] [place] [address] Phone: [phone] Fax: [phone]  [id number] [place] [address]: [phone]: [phone] [id number] [address]Phone: [phone]: [phone]    Social Determinants of Health (SDOH) Interventions    Readmission Risk Interventions     No data to display

## 2022-08-16 NOTE — Assessment & Plan Note (Signed)
-   This could be NPH as the patient has been having altered mental status with urinary incontinence and ataxia. - We will obtain a brain MRI with and without contrast as mentioned above. - Neurosurgery consult will be obtained. - Dr. Izora Ribas was notified and is aware about the patient.. - We will defer further work-up to his discretion.

## 2022-08-16 NOTE — Assessment & Plan Note (Signed)
-   We will continue her statin therapy. 

## 2022-08-16 NOTE — Care Management Obs Status (Signed)
[place] NOTIFICATION   Patient Details  Name: Michelle Cisneros MRN: [id number] Date of Birth: 01/30/1956   Medicare Observation Status Notification Given:  Yes    Shelbie Hutching, RN 08/16/2022, 1:04 PM

## 2022-08-16 NOTE — Assessment & Plan Note (Signed)
-   We will continue her Wellbutrin XL.

## 2022-08-16 NOTE — H&P (Signed)
[place]   PATIENT NAME: Michelle Cisneros    MR#:  [id number]  DATE OF BIRTH:  [birth year]  DATE OF ADMISSION:  08/16/2022  PRIMARY CARE PHYSICIAN: Maryland Pink, MD   Patient is coming from: Home  REQUESTING/REFERRING PHYSICIAN: Vladimir Crofts, MD  CHIEF COMPLAINT:   Chief Complaint  Patient presents with   Fall    HISTORY OF PRESENT ILLNESS:  Michelle Cisneros is a 66 y.o. Caucasian female with medical history significant for hypertension, dyslipidemia, depression, collagen vascular disease and recurrent UTI, who presented to the emergency room with acute onset of recent recurrent falls with unsteady gait per her daughter as well as mild altered mental status.  The patient has chronic urinary incontinence.  She had subsequent left hand contusion as well as left frontal forehead abrasion and contusion with nasal contusion and bruising with abrasion.  She denies any presyncope or syncope.  She denies any vertigo or tinnitus.  No paresthesias or focal muscle weakness.  She stated that she is just been feeling off balance lately.  No current dysuria, oliguria or hematuria or flank pain.  No chest pain or palpitations.  No cough or wheezing or dyspnea.  No bleeding diathesis.  ED Course: When she came to the ER, vital signs were within normal.  Labs revealed a BUN of 28 with otherwise unremarkable BMP.  CBC was within normal.  UA was unremarkable.  Imaging: Left hand and wrist x-ray showed no acute osseous abnormality.  It showed moderate to marked severity chronic and degenerative changes of the first carpometacarpal articulation.  And no acute fracture or dislocation in the left hand with chronic deformity of the distal third left metacarpal.  C-spine CT showed no acute fracture or traumatic subluxation.  It showed 1.3 cm incidental right thyroid nodule with no follow-up imaging recommended.  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  The patient was given 600 mg of p.o. ibuprofen.  She will be admitted to a medical telemetry bed for further evaluation and management. PAST MEDICAL HISTORY:   Past Medical History:  Diagnosis Date   Aortic atherosclerosis (HCC)    Arthritis    Back pain    Basal cell carcinoma    L forehead, txted in past by another provider   Collagen vascular disease ([place])    Depression    DOE (dyspnea on exertion)    a. 05/2020 Echo: EF 60-65%, no rwma, nl RV size/fxn.   History of stress test    a. 04/2020 Lexiscan MV: No ischemia/infarct.   Hyperlipemia    Hypertension    Melanoma ([place]) ~2015   R forearm txted in past by another provider   Recurrent UTI    Squamous cell carcinoma of skin    L med ankle, txted in past by another provider    PAST SURGICAL HISTORY:   Past Surgical History:  Procedure Laterality Date   ABDOMINAL HYSTERECTOMY     COLONOSCOPY WITH PROPOFOL N/A 05/26/2020   Procedure: COLONOSCOPY WITH PROPOFOL;  Surgeon: Toledo, Benay Pike, MD;  Location: [facility] ENDOSCOPY;  Service: Gastroenterology;  Laterality: N/A;   INCONTINENCE SURGERY     OOPHORECTOMY      SOCIAL HISTORY:   Social History   Tobacco Use   Smoking status: Former    Packs/day: 1.00    Years: 30.00    Total pack years: 30.00    Types:  Cigarettes    Quit date: 10/04/1987    Years since quitting: 34.8   Smokeless tobacco: Never  Substance Use Topics   Alcohol use: Yes    Comment: occassional; once glass of wine    FAMILY HISTORY:   Family History  Problem Relation Age of Onset   Cancer Father        laryngeal   Atrial fibrillation Father    Stroke Mother    Breast cancer Neg Hx    Bladder Cancer Neg Hx    Kidney cancer Neg Hx     DRUG ALLERGIES:  No Known Allergies  REVIEW OF SYSTEMS:   ROS As per history of present illness. All pertinent systems were reviewed above. Constitutional, HEENT, cardiovascular, respiratory,  GI, GU, musculoskeletal, neuro, psychiatric, endocrine, integumentary and hematologic systems were reviewed and are otherwise negative/unremarkable except for positive findings mentioned above in the HPI.   MEDICATIONS AT HOME:   Prior to Admission medications   Medication Sig Start Date End Date Taking? Authorizing Provider  aspirin EC 81 MG tablet Take 1 tablet (81 mg total) by mouth daily. 04/13/21  Yes End, Harrell Gave, MD  atorvastatin (LIPITOR) 10 MG tablet TAKE ONE TABLET BY MOUTH DAILY 02/13/18  Yes [provider]  buPROPion (WELLBUTRIN XL) 300 MG 24 hr tablet TAKE ONE TABLET BY MOUTH DAILY 02/13/18  Yes [provider]  Calcium Carbonate-Vitamin D 600-400 MG-UNIT tablet Take 1 tablet by mouth daily.   Yes [provider]  fluticasone (FLONASE) 50 MCG/ACT nasal spray Place 2 sprays into both nostrils daily.   Yes [provider]  lisinopril (ZESTRIL) 20 MG tablet Take 1 tablet (20 mg total) by mouth daily. 07/28/22  Yes End, Harrell Gave, MD  nitrofurantoin, macrocrystal-monohydrate, (MACROBID) 100 MG capsule Take 1 capsule (100 mg total) by mouth daily. 06/28/22  Yes MacDiarmid, Nicki Reaper, MD  eszopiclone (LUNESTA) 2 MG TABS tablet Take 1 tablet (2 mg total) by mouth at bedtime as needed for sleep. Take immediately before bedtime Patient not taking: Reported on 08/16/2022 05/28/22   Sueanne Margarita, MD  metroNIDAZOLE (METROCREAM) 0.75 % cream Apply to face once to twice daily for rosacea. 04/19/22   Brendolyn Patty, MD      VITAL SIGNS:  Blood pressure 125/76, pulse 65, temperature 98 F (36.7 C), temperature source Oral, resp. rate 16, height '5\' 4"'$  (1.626 m), weight 72.1 kg, SpO2 97 %.  PHYSICAL EXAMINATION:  Physical Exam  GENERAL:  66 y.o.-year-old Caucasian female patient lying in the bed with no acute distress.  EYES: Pupils equal, round, reactive to light and accommodation. No scleral icterus. Extraocular muscles intact.  HEENT: Head with left forehead  and nasal abrasion and contusion, normocephalic. Oropharynx and nasopharynx clear.  NECK:  Supple, no jugular venous distention. No thyroid enlargement, no tenderness.  LUNGS: Normal breath sounds bilaterally, no wheezing, rales,rhonchi or crepitation. No use of accessory muscles of respiration.  CARDIOVASCULAR: Regular rate and rhythm, S1, S2 normal. No murmurs, rubs, or gallops.  ABDOMEN: Soft, nondistended, nontender. Bowel sounds present. No organomegaly or mass.  EXTREMITIES: No pedal edema, cyanosis, or clubbing.  Left dorsal hand swelling and pulmonary contusion NEUROLOGIC: Cranial nerves II through XII are intact. Muscle strength 5/5 in all extremities. Sensation intact. Gait not checked.  PSYCHIATRIC: The patient is alert and oriented x 3.  Normal affect and good eye contact. SKIN: No obvious rash, lesion, or ulcer.   LABORATORY PANEL:   CBC Recent Labs  Lab 08/16/22 0514  WBC 6.9  HGB 13.3  HCT 40.3  PLT 232   ------------------------------------------------------------------------------------------------------------------  Chemistries  Recent Labs  Lab 08/16/22 0514  NA 139  K 4.2  CL 107  CO2 24  GLUCOSE 105*  BUN 24*  CREATININE 0.57  CALCIUM 8.9   ------------------------------------------------------------------------------------------------------------------  Cardiac Enzymes No results for input(s): "TROPONINI" in the last 168 hours. ------------------------------------------------------------------------------------------------------------------  RADIOLOGY:  CT HEAD WO CONTRAST (5MM)  Result Date: 08/15/2022 CLINICAL DATA:  Facial trauma EXAM: CT HEAD WITHOUT CONTRAST CT MAXILLOFACIAL WITHOUT CONTRAST TECHNIQUE: Multidetector CT imaging of the head and maxillofacial structures were performed using the standard protocol without intravenous contrast. Multiplanar CT image reconstructions of the maxillofacial structures were also generated. RADIATION DOSE  REDUCTION: This exam was performed according to the departmental dose-optimization program which includes automated exposure control, adjustment of the mA and/or kV according to patient size and/or use of iterative reconstruction technique. COMPARISON:  None Available. FINDINGS: CT HEAD FINDINGS Brain: There is severe hydrocephalus of the lateral ventricles. Extra-axial CSF spaces are normal. No acute hemorrhage. Septum pellucidum is not visible and likely absent. 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 Vascular: No hyperdense vessel or unexpected vascular calcification. Skull: The visualized skull base, calvarium and extracranial soft tissues are normal. CT MAXILLOFACIAL FINDINGS Osseous: No facial fracture Orbits: The globes and optic nerves are intact. Normal extraocular muscles and intraorbital fat. Sinuses: No acute finding. Soft tissues: Normal visualized extracranial soft tissues. IMPRESSION: 1. 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 No acute hemorrhage. 2. No facial fracture. Electronically Signed   By: Ulyses Jarred M.D.   On: 08/15/2022 21:12   CT Maxillofacial Wo Contrast  Result Date: 08/15/2022 CLINICAL DATA:  Facial trauma EXAM: CT HEAD WITHOUT CONTRAST CT MAXILLOFACIAL WITHOUT CONTRAST TECHNIQUE: Multidetector CT imaging of the head and maxillofacial structures were performed using the standard protocol without intravenous contrast. Multiplanar CT image reconstructions of the maxillofacial structures were also generated. RADIATION DOSE REDUCTION: This exam was performed according to the departmental dose-optimization program which includes automated exposure control, adjustment of the mA and/or kV according to patient size and/or use of iterative reconstruction technique. COMPARISON:  None Available. FINDINGS: CT HEAD FINDINGS Brain: There is severe hydrocephalus of the lateral ventricles. Extra-axial CSF spaces are normal. No acute hemorrhage. Septum pellucidum is not visible  and likely absent. 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 Vascular: No hyperdense vessel or unexpected vascular calcification. Skull: The visualized skull base, calvarium and extracranial soft tissues are normal. CT MAXILLOFACIAL FINDINGS Osseous: No facial fracture Orbits: The globes and optic nerves are intact. Normal extraocular muscles and intraorbital fat. Sinuses: No acute finding. Soft tissues: Normal visualized extracranial soft tissues. IMPRESSION: 1. 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 No acute hemorrhage. 2. No facial fracture. Electronically Signed   By: Ulyses Jarred M.D.   On: 08/15/2022 21:12   CT Cervical Spine Wo Contrast  Result Date: 08/15/2022 CLINICAL DATA:  Trauma. EXAM: CT CERVICAL SPINE WITHOUT CONTRAST TECHNIQUE: Multidetector CT imaging of the cervical spine was performed without intravenous contrast. Multiplanar CT image reconstructions were also generated. RADIATION DOSE REDUCTION: This exam was performed according to the departmental dose-optimization program which includes automated exposure control, adjustment of the mA and/or kV according to patient size and/or use of iterative reconstruction technique. COMPARISON:  None Available. FINDINGS: Alignment: Normal. Skull base and vertebrae: No acute fracture. No primary bone lesion or focal pathologic process. Soft tissues and spinal canal: No prevertebral fluid or swelling. No visible canal hematoma. Disc levels: There is mild disc  space narrowing and endplate osteophyte formation throughout the cervical spine most significant at C5-C6, C6-C7 and C7-T1. There is some mild right-sided neural foraminal stenosis at C5-C6 secondary to uncovertebral spurring. There is no severe central canal stenosis at any level. Upper chest: Negative. Other: There is a partially calcified right thyroid nodule measuring 13 mm. IMPRESSION: 1. No acute fracture or traumatic subluxation of the cervical spine. 2. 1.3 cm incidental right  thyroid nodule. No follow-up imaging is recommended. Reference: J Am Coll Radiol. 2015 Feb;12(2): 143-50 Electronically Signed   By: Ronney Asters M.D.   On: 08/15/2022 21:08   DG Wrist Complete Left  Result Date: 08/15/2022 CLINICAL DATA:  Status post trauma. EXAM: LEFT WRIST - COMPLETE 3+ VIEW COMPARISON:  None Available. FINDINGS: There is no evidence of an acute fracture or dislocation. Moderate to marked severity chronic and degenerative changes are seen involving the first carpometacarpal articulation. A chronic deformity of the adjacent portion of the trapezium bone is noted. Soft tissues are unremarkable. IMPRESSION: 1. No acute osseous abnormality. 2. Moderate to marked severity chronic and degenerative changes of the first carpometacarpal articulation. Electronically Signed   By: Virgina Norfolk M.D.   On: 08/15/2022 21:00   DG Hand Complete Left  Result Date: 08/15/2022 CLINICAL DATA:  Status post trauma. EXAM: LEFT HAND - COMPLETE 3+ VIEW COMPARISON:  None Available. FINDINGS: There is no evidence of an acute fracture or dislocation. A chronic deformity is seen involving the distal aspect of the third left metacarpal. Degenerative changes are seen along the carpometacarpal articulation of the left thumb. Soft tissues are unremarkable. IMPRESSION: 1. No acute fracture or dislocation. 2. Chronic deformity of the distal third left metacarpal. Electronically Signed   By: Virgina Norfolk M.D.   On: 08/15/2022 20:58      IMPRESSION AND PLAN:  Assessment and Plan: * Recurrent falls - The patient has subsequent left forehead and nasal abrasion and contusion as well as left wrist swelling with pulm contusion. - The patient will be admitted to a medical telemetry bed. - PT consult will be obtained to assess her ambulation. - A brain MRI with and without contrast will be performed to assess for cerebral or CVA as well as her large hydrocephalus..  Hydrocephalus ([place]) - This could be NPH as  the patient has been having altered mental status with urinary incontinence and ataxia. - We will obtain a brain MRI with and without contrast as mentioned above. - Neurosurgery consult will be obtained. - Dr. Izora Ribas was notified and is aware about the patient.. - We will defer further work-up to his discretion.  Essential hypertension - We will continue her antihypertensives  Hyperlipidemia - We will continue her statin therapy  Depression - We will continue her Wellbutrin XL.   DVT prophylaxis: Lovenox.  Advanced Care Planning:  Code Status: full code.  Family Communication:  The plan of care was discussed in details with the patient (and family). I answered all questions. The patient agreed to proceed with the above mentioned plan. Further management will depend upon hospital course. Disposition Plan: Back to previous home environment Consults called: Neurosurgery All the records are reviewed and case discussed with ED provider.  Status is: Inpatient   At the time of the admission, it appears that the appropriate admission status for this patient is inpatient.  This is judged to be reasonable and necessary in order to provide the required intensity of service to ensure the patient's safety given the presenting symptoms, physical  exam findings and initial radiographic and laboratory data in the context of comorbid conditions.  The patient requires inpatient status due to high intensity of service, high risk of further deterioration and high frequency of surveillance required.  I certify that at the time of admission, it is my clinical judgment that the patient will require inpatient hospital care extending more than 2 midnights.                            Dispo: The patient is from: Home              Anticipated d/c is to: Home              Patient currently is not medically stable to d/c.              Difficult to place patient: No  Christel Mormon M.D on 08/16/2022 at 6:17  AM  [facility]   From 7 PM-7 AM, contact night-coverage [email or web address]  CC: Primary care physician; [place] Pink, MD

## 2022-08-16 NOTE — Assessment & Plan Note (Signed)
-   We will continue her antihypertensives. 

## 2022-08-16 NOTE — Care Management CC44 (Signed)
Condition Code 44 Documentation Completed  Patient Details  Name: Michelle Cisneros MRN: [id number] Date of Birth: September 23, 1956   Condition Code 44 given:  Yes Patient signature on Condition Code 44 notice:  Yes Documentation of 2 MD's agreement:  Yes Code 44 added to claim:  Yes    Shelbie Hutching, RN 08/16/2022, 1:04 PM

## 2022-08-16 NOTE — Progress Notes (Signed)
OT Cancellation Note  Patient Details Name: Michelle Cisneros MRN: [id number] DOB: 1956/03/27   Cancelled Treatment:    Reason Eval/Treat Not Completed: OT screened, no needs identified, will sign off. Order received, chart reviewed. Per conversation with PT, pt back to baseline functional independence. No skilled OT needs identified. Will sign off. Please re-consult if additional needs arise.   Dessie Coma, M.S. OTR/L  08/16/22, 9:57 AM  ascom [phone]

## 2022-08-16 NOTE — ED Notes (Signed)
Pt left hand swollen and bruised, pt states pain of 4. Pt face upper mid forehead, nasal bridge, and under show abrasion.  Pt denies pain in facial area.  Pt A&Ox4 with daughter at bedside.

## 2022-08-16 NOTE — ED Provider Notes (Signed)
[facility] Provider Note    Event Date/Time   First MD Initiated Contact with Patient 08/16/22 0159     (approximate)   History   Fall   HPI  Michelle Cisneros is a 66 y.o. female who presents to the ED for evaluation of Fall   I reviewed cardiology clinic visit from 6/2.  History of HTN, HLD, chronic lower back pain.  Presents to the ED with her daughter for evaluation of a mechanical fall that occurred this evening.  She reports increasing unsteadiness and mechanical falls over the past few months.  Reports that she tripped on a rug today and struck her face and head on a carpeted surface.  No syncope.  Also reports fooshing onto her left hand and wrist.   Physical Exam   Triage Vital Signs: ED Triage Vitals  Enc Vitals Group     BP 08/15/22 2011 (!) 141/88     Pulse Rate 08/15/22 2011 92     Resp 08/15/22 2011 16     Temp 08/15/22 2011 98.1 F (36.7 C)     Temp Source 08/15/22 2011 Oral     SpO2 08/15/22 2011 99 %     Weight 08/15/22 2012 159 lb (72.1 kg)     Height 08/15/22 2012 '5\' 4"'$  (1.626 m)     Head Circumference --      Peak Flow --      Pain Score --      Pain Loc --      Pain Edu? --      Excl. in [place]? --     Most recent vital signs: Vitals:   08/16/22 0414 08/16/22 0414  BP:  125/76  Pulse:  65  Resp:  16  Temp:  98 F (36.7 C)  SpO2: 96% 97%    General: Awake, no distress.  CV:  Good peripheral perfusion.  Resp:  Normal effort.  Abd:  No distention.  MSK:  No deformity noted.  Neuro:  No focal deficits appreciated. Cranial nerves II through XII intact 5/5 strength and sensation in all 4 extremities  Other:  Abrasion to the nose without evidence of nasal septal hematoma.  No signs of EOM entrapment.  No signs of depressed skull fracture.  Mild and diffuse swelling of the left dorsal hand and wrist.  No open injury.   ED Results / Procedures / Treatments   Labs (all labs ordered are listed, but only abnormal  results are displayed) Labs Reviewed  BASIC METABOLIC PANEL - Abnormal; Notable for the following components:      Result Value   Glucose, Bld 117 (*)    BUN 28 (*)    All other components within normal limits  CBC WITH DIFFERENTIAL/PLATELET  URINALYSIS, ROUTINE W REFLEX MICROSCOPIC  HIV ANTIBODY (ROUTINE TESTING W REFLEX)  BASIC METABOLIC PANEL  CBC    EKG   RADIOLOGY CT head interpreted by me with large volume hydrocephalus. Plain film of the left wrist interpreted by me with evidence of fracture or dislocation. Plain film of the left hand interpreted by me without evidence of fracture or dislocation.  Official radiology report(s): CT HEAD WO CONTRAST (5MM)  Result Date: 08/15/2022 CLINICAL DATA:  Facial trauma EXAM: CT HEAD WITHOUT CONTRAST CT MAXILLOFACIAL WITHOUT CONTRAST TECHNIQUE: Multidetector CT imaging of the head and maxillofacial structures were performed using the standard protocol without intravenous contrast. Multiplanar CT image reconstructions of the maxillofacial structures were also generated. RADIATION DOSE REDUCTION: This exam  was performed according to the departmental dose-optimization program which includes automated exposure control, adjustment of the mA and/or kV according to patient size and/or use of iterative reconstruction technique. COMPARISON:  None Available. FINDINGS: CT HEAD FINDINGS Brain: There is severe hydrocephalus of the lateral ventricles. Extra-axial CSF spaces are normal. No acute hemorrhage. Septum pellucidum is not visible and likely absent. 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 Vascular: No hyperdense vessel or unexpected vascular calcification. Skull: The visualized skull base, calvarium and extracranial soft tissues are normal. CT MAXILLOFACIAL FINDINGS Osseous: No facial fracture Orbits: The globes and optic nerves are intact. Normal extraocular muscles and intraorbital fat. Sinuses: No acute finding. Soft tissues: Normal visualized  extracranial soft tissues. IMPRESSION: 1. 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 No acute hemorrhage. 2. No facial fracture. Electronically Signed   By: Ulyses Jarred M.D.   On: 08/15/2022 21:12   CT Maxillofacial Wo Contrast  Result Date: 08/15/2022 CLINICAL DATA:  Facial trauma EXAM: CT HEAD WITHOUT CONTRAST CT MAXILLOFACIAL WITHOUT CONTRAST TECHNIQUE: Multidetector CT imaging of the head and maxillofacial structures were performed using the standard protocol without intravenous contrast. Multiplanar CT image reconstructions of the maxillofacial structures were also generated. RADIATION DOSE REDUCTION: This exam was performed according to the departmental dose-optimization program which includes automated exposure control, adjustment of the mA and/or kV according to patient size and/or use of iterative reconstruction technique. COMPARISON:  None Available. FINDINGS: CT HEAD FINDINGS Brain: There is severe hydrocephalus of the lateral ventricles. Extra-axial CSF spaces are normal. No acute hemorrhage. Septum pellucidum is not visible and likely absent. 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 Vascular: No hyperdense vessel or unexpected vascular calcification. Skull: The visualized skull base, calvarium and extracranial soft tissues are normal. CT MAXILLOFACIAL FINDINGS Osseous: No facial fracture Orbits: The globes and optic nerves are intact. Normal extraocular muscles and intraorbital fat. Sinuses: No acute finding. Soft tissues: Normal visualized extracranial soft tissues. IMPRESSION: 1. 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 No acute hemorrhage. 2. No facial fracture. Electronically Signed   By: Ulyses Jarred M.D.   On: 08/15/2022 21:12   CT Cervical Spine Wo Contrast  Result Date: 08/15/2022 CLINICAL DATA:  Trauma. EXAM: CT CERVICAL SPINE WITHOUT CONTRAST TECHNIQUE: Multidetector CT imaging of the cervical spine was performed without intravenous contrast. Multiplanar  CT image reconstructions were also generated. RADIATION DOSE REDUCTION: This exam was performed according to the departmental dose-optimization program which includes automated exposure control, adjustment of the mA and/or kV according to patient size and/or use of iterative reconstruction technique. COMPARISON:  None Available. FINDINGS: Alignment: Normal. Skull base and vertebrae: No acute fracture. No primary bone lesion or focal pathologic process. Soft tissues and spinal canal: No prevertebral fluid or swelling. No visible canal hematoma. Disc levels: There is mild disc space narrowing and endplate osteophyte formation throughout the cervical spine most significant at C5-C6, C6-C7 and C7-T1. There is some mild right-sided neural foraminal stenosis at C5-C6 secondary to uncovertebral spurring. There is no severe central canal stenosis at any level. Upper chest: Negative. Other: There is a partially calcified right thyroid nodule measuring 13 mm. IMPRESSION: 1. No acute fracture or traumatic subluxation of the cervical spine. 2. 1.3 cm incidental right thyroid nodule. No follow-up imaging is recommended. Reference: J Am Coll Radiol. 2015 Feb;12(2): 143-50 Electronically Signed   By: Ronney Asters M.D.   On: 08/15/2022 21:08   DG Wrist Complete Left  Result Date: 08/15/2022 CLINICAL DATA:  Status post trauma. EXAM: LEFT WRIST -  COMPLETE 3+ VIEW COMPARISON:  None Available. FINDINGS: There is no evidence of an acute fracture or dislocation. Moderate to marked severity chronic and degenerative changes are seen involving the first carpometacarpal articulation. A chronic deformity of the adjacent portion of the trapezium bone is noted. Soft tissues are unremarkable. IMPRESSION: 1. No acute osseous abnormality. 2. Moderate to marked severity chronic and degenerative changes of the first carpometacarpal articulation. Electronically Signed   By: Virgina Norfolk M.D.   On: 08/15/2022 21:00   DG Hand Complete  Left  Result Date: 08/15/2022 CLINICAL DATA:  Status post trauma. EXAM: LEFT HAND - COMPLETE 3+ VIEW COMPARISON:  None Available. FINDINGS: There is no evidence of an acute fracture or dislocation. A chronic deformity is seen involving the distal aspect of the third left metacarpal. Degenerative changes are seen along the carpometacarpal articulation of the left thumb. Soft tissues are unremarkable. IMPRESSION: 1. No acute fracture or dislocation. 2. Chronic deformity of the distal third left metacarpal. Electronically Signed   By: Virgina Norfolk M.D.   On: 08/15/2022 20:58    PROCEDURES and INTERVENTIONS:  Procedures  Medications  atorvastatin (LIPITOR) tablet 10 mg (has no administration in time range)  lisinopril (ZESTRIL) tablet 20 mg (has no administration in time range)  buPROPion (WELLBUTRIN XL) 24 hr tablet 300 mg (has no administration in time range)  Calcium Carbonate-Vitamin D 600-400 MG-UNIT 1 tablet (has no administration in time range)  fluticasone (FLONASE) 50 MCG/ACT nasal spray 2 spray (has no administration in time range)  enoxaparin (LOVENOX) injection 40 mg (has no administration in time range)  0.9 %  sodium chloride infusion (has no administration in time range)  acetaminophen (TYLENOL) tablet 650 mg (has no administration in time range)    Or  acetaminophen (TYLENOL) suppository 650 mg (has no administration in time range)  traZODone (DESYREL) tablet 25 mg (has no administration in time range)  magnesium hydroxide (MILK OF MAGNESIA) suspension 30 mL (has no administration in time range)  ondansetron (ZOFRAN) tablet 4 mg (has no administration in time range)    Or  ondansetron (ZOFRAN) injection 4 mg (has no administration in time range)  ibuprofen (ADVIL) tablet 600 mg (600 mg Oral Given 08/16/22 0150)     IMPRESSION / MDM / ASSESSMENT AND PLAN / ED COURSE  I reviewed the triage vital signs and the nursing notes.  Differential diagnosis includes, but is not  limited to, skull fracture, stroke, nasal bone fracture, seizure, syncope  {Patient presents with symptoms of an acute illness or injury that is potentially life-threatening.  66 year old woman presents to the ED with progressive worsening of her gait with recurrent falls, found to have fairly severe hydrocephalus that I suspect has been chronic and possibly related to her progressive symptoms at home.  She looks systemically well overall without signs of significant traumatic pathology beyond abrasion to her nose.  Screening blood work is benign with a normal metabolic panel and CBC.  CT head with what appears to be chronic and fairly severe hydrocephalus of the lateral ventricles.  No signs of acute cranial pathology.  Clinical Course as of 08/16/22 0427  Mon Aug 16, 2022  0314 I consult hospitalist who agrees to admit. [DS]    Clinical Course User Index [DS] Vladimir Crofts, MD     FINAL CLINICAL IMPRESSION(S) / ED DIAGNOSES   Final diagnoses:  Other hydrocephalus ([place])  Fall, initial encounter  Recurrent falls     Rx / DC Orders   ED Discharge  Orders     None        Note:  This document was prepared using Dragon voice recognition software and may include unintentional dictation errors.   Vladimir Crofts, MD 08/16/22 [phone]

## 2022-08-16 NOTE — ED Notes (Signed)
Assumed care, pt alert and oriented with no needs. Daughter at bedside. L Hand swollen from fall. + CSM. Of LUE.

## 2022-08-16 NOTE — Evaluation (Signed)
Physical Therapy Evaluation Patient Details Name: Michelle Cisneros MRN: [id number] DOB: 1956/08/18 Today's Date: 08/16/2022  History of Present Illness  Pt is a 66 y/o F admitted on 08/16/22 after presenting to the ED with c/o recurrent falls with unsteady gait & mild AMS. Pt is being treated for recurrent falls & hydrocephalus. PMH: HTN, dyslipidemia, depression, collagen vascular disease, recurrent UTI  Clinical Impression  Pt seen for PT evaluation with pt agreeable & pt's daughter present for session. Prior to admission pt was independent without AD, cares for her 34 young grandchildren (36, 103, 28 y/o) & is caring for her husband who recently had ankle sx. Pt also notes she recently started OPPT again 2/2 balance issues. On this date, pt is mod I for bed mobility & transfers, & ambulates increased distances in the hallway without AD without overt LOB. Will continue to follow pt acutely to address high level balance to facilitate return to high PLOF & reduce fall risk. Educated pt on need to decrease speed of functional mobility & be more aware of surroundings, as well as PRN assistance upon initial return home & pt & daughter agreeable.     Recommendations for follow up therapy are one component of a multi-disciplinary discharge planning process, led by the attending physician.  Recommendations may be updated based on patient status, additional functional criteria and insurance authorization.  Follow Up Recommendations Outpatient PT (resume OPPT services)      Assistance Recommended at Discharge PRN  Patient can return home with the following       Equipment Recommendations None recommended by PT  Recommendations for Other Services       Functional Status Assessment Patient has not had a recent decline in their functional status     Precautions / Restrictions Precautions Precautions: None Restrictions Weight Bearing Restrictions: No      Mobility  Bed Mobility Overal bed  mobility: Modified Independent             General bed mobility comments: supine<>sit with mod I    Transfers Overall transfer level: Independent Equipment used: None                    Ambulation/Gait Ambulation/Gait assistance: Independent Gait Distance (Feet): 200 Feet Assistive device: None   Gait velocity: increased gait speed     General Gait Details: forward lean (also promoted by spinal stenosis & forward posture)  Stairs            Wheelchair Mobility    Modified Rankin (Stroke Patients Only)       Balance Overall balance assessment: History of Falls, Needs assistance Sitting-balance support: Feet supported Sitting balance-Leahy Scale: Good Sitting balance - Comments: pt able to don shoes sitting EOB without assistance   Standing balance support: During functional activity, No upper extremity supported Standing balance-Leahy Scale: Good                               Pertinent Vitals/Pain Pain Assessment Pain Assessment: Faces Faces Pain Scale: Hurts little more Pain Location: L hand Pain Descriptors / Indicators: Discomfort Pain Intervention(s): Monitored during session    Home Living Family/patient expects to be discharged to:: Private residence Living Arrangements: Spouse/significant other Available Help at Discharge: Family Type of Home: House Home Access: Level entry       Home Layout: Multi-level;Able to live on main level with bedroom/bathroom        Prior  Function Prior Level of Function : Independent/Modified Independent;Driving             Mobility Comments: Pt is independent without AD, driving, recently started OPPT for balance issues. Pt lives with her spouse who recently had an ankle sx & she is caring for him, in addition to routinely caring for her 3 yound grandchildren (2, 69, 81 y/o).       Hand Dominance        Extremity/Trunk Assessment   Upper Extremity Assessment Upper Extremity  Assessment: LUE deficits/detail LUE Deficits / Details: LUE edema 2/2 fall    Lower Extremity Assessment Lower Extremity Assessment: Overall WFL for tasks assessed    Cervical / Trunk Assessment Cervical / Trunk Assessment: Kyphotic (pt reports spinal stenosis; brusing/cuts to face/nose 2/2 fall)  Communication      Cognition Arousal/Alertness: Awake/alert Behavior During Therapy: WFL for tasks assessed/performed Overall Cognitive Status: Within Functional Limits for tasks assessed                                          General Comments      Exercises     Assessment/Plan    PT Assessment Patient needs continued PT services  PT Problem List Decreased balance;Decreased activity tolerance       PT Treatment Interventions DME instruction;Therapeutic exercise;Gait training;Balance training;Stair training;Neuromuscular re-education;Functional mobility training;Therapeutic activities;Patient/family education    PT Goals (Current goals can be found in the Care Plan section)  Acute Rehab PT Goals Patient Stated Goal: improve balance PT Goal Formulation: With patient/family Time For Goal Achievement: 08/30/22 Potential to Achieve Goals: Good Additional Goals Additional Goal #1: Pt will score 54/56 on Berg Balance Test to reduce fall risk with functional mobility.    Frequency Min 2X/week     Co-evaluation               AM-PAC PT "6 Clicks" Mobility  Outcome Measure Help needed turning from your back to your side while in a flat bed without using bedrails?: None Help needed moving from lying on your back to sitting on the side of a flat bed without using bedrails?: None Help needed moving to and from a bed to a chair (including a wheelchair)?: None Help needed standing up from a chair using your arms (e.g., wheelchair or bedside chair)?: None Help needed to walk in hospital room?: None Help needed climbing 3-5 steps with a railing? : None 6 Click  Score: 24    End of Session   Activity Tolerance: Patient tolerated treatment well Patient left: in bed;with call bell/phone within reach;with family/visitor present   PT Visit Diagnosis: Unsteadiness on feet (R26.81);History of falling (Z91.81)    Time: 3491-7915 PT Time Calculation (min) (ACUTE ONLY): 12 min   Charges:   PT Evaluation $PT Eval Low Complexity: [place], PT, DPT 08/16/22, 10:59 AM   Waunita Schooner 08/16/2022, 10:57 AM

## 2022-08-26 DIAGNOSIS — Z9181 History of falling: Secondary | ICD-10-CM | POA: Diagnosis not present

## 2022-08-26 DIAGNOSIS — I10 Essential (primary) hypertension: Secondary | ICD-10-CM | POA: Diagnosis not present

## 2022-08-26 DIAGNOSIS — Z09 Encounter for follow-up examination after completed treatment for conditions other than malignant neoplasm: Secondary | ICD-10-CM | POA: Diagnosis not present

## 2022-08-26 DIAGNOSIS — G919 Hydrocephalus, unspecified: Secondary | ICD-10-CM | POA: Diagnosis not present

## 2022-08-26 DIAGNOSIS — S0081XD Abrasion of other part of head, subsequent encounter: Secondary | ICD-10-CM | POA: Diagnosis not present

## 2022-08-26 DIAGNOSIS — Z1331 Encounter for screening for depression: Secondary | ICD-10-CM | POA: Diagnosis not present

## 2022-08-30 ENCOUNTER — Ambulatory Visit: Payer: PPO | Admitting: Urology

## 2022-09-01 ENCOUNTER — Encounter: Payer: Self-pay | Admitting: Physical Therapy

## 2022-09-01 ENCOUNTER — Ambulatory Visit: Payer: PPO | Attending: Family Medicine | Admitting: Physical Therapy

## 2022-09-01 DIAGNOSIS — R262 Difficulty in walking, not elsewhere classified: Secondary | ICD-10-CM | POA: Diagnosis not present

## 2022-09-01 DIAGNOSIS — M545 Low back pain, unspecified: Secondary | ICD-10-CM | POA: Insufficient documentation

## 2022-09-01 DIAGNOSIS — R296 Repeated falls: Secondary | ICD-10-CM | POA: Insufficient documentation

## 2022-09-01 DIAGNOSIS — G8929 Other chronic pain: Secondary | ICD-10-CM | POA: Insufficient documentation

## 2022-09-01 DIAGNOSIS — R2689 Other abnormalities of gait and mobility: Secondary | ICD-10-CM | POA: Insufficient documentation

## 2022-09-01 NOTE — Therapy (Signed)
OUTPATIENT PHYSICAL THERAPY THORACOLUMBAR EVALUATION   Patient Name: Michelle Cisneros MRN: [id number] DOB:1956/06/21, 66 y.o., female Today's Date: 09/03/2022      Past Medical History:  Diagnosis Date   Aortic atherosclerosis (HCC)    Arthritis    Back pain    Basal cell carcinoma    L forehead, txted in past by another provider   Collagen vascular disease ([place])    Depression    DOE (dyspnea on exertion)    a. 05/2020 Echo: EF 60-65%, no rwma, nl RV size/fxn.   History of stress test    a. 04/2020 Lexiscan MV: No ischemia/infarct.   Hyperlipemia    Hypertension    Melanoma ([place]) ~2015   R forearm txted in past by another provider   Recurrent UTI    Squamous cell carcinoma of skin    L med ankle, txted in past by another provider   Past Surgical History:  Procedure Laterality Date   ABDOMINAL HYSTERECTOMY     COLONOSCOPY WITH PROPOFOL N/A 05/26/2020   Procedure: COLONOSCOPY WITH PROPOFOL;  Surgeon: Toledo, Benay Pike, MD;  Location: [facility] ENDOSCOPY;  Service: Gastroenterology;  Laterality: N/A;   INCONTINENCE SURGERY     OOPHORECTOMY     Patient Active Problem List   Diagnosis Date Noted   Recurrent falls 08/16/2022   Hydrocephalus ([place]) 08/16/2022   Depression 08/16/2022   OSA (obstructive sleep apnea) 04/27/2022   Palpitations 10/03/2021   Dyspnea on exertion 04/19/2020   Aortic atherosclerosis ([place]) 04/19/2020   Essential hypertension 04/19/2020   Hyperlipidemia 04/19/2020   Right carpal tunnel syndrome 11/09/2016    PCP: Maryland Pink MD  REFERRING PROVIDER: Maryland Pink MD  REFERRING DIAG: LBP  Rationale for Evaluation and Treatment Rehabilitation  THERAPY DIAG:  Chronic midline low back pain without sciatica  ONSET DATE: Chronic  SUBJECTIVE:                                                                                                                                                                                           SUBJECTIVE  STATEMENT: Pt arrives to PT with daughter. Daughter explains patient was admitted to hospital (observation) after falling from tripping on her coffee table. No LOC, broken bones, etc. 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 She reports this has been difficult psychologically because she is being "scanned for all these things". With all this, has not completed HEP. Would like a continued updated HEP for LBP,  but would like to transition PT to balance. Hasn't had any back pain.  PERTINENT HISTORY:  Pt is familiar with this clinic reporting with midline LBP without sciatica. Reports pain is center of LB. Pt reports starting [employer] a year and a half ago and has been walking more frequently in this time. Through walking more her back pain is reduced from last episode of therapy, but is more concerned that her gait feels off. She is wanting footwear and HEP advisement from PT to mange these symptoms with [employer] program. Current LBP 0/10; worst 6/10. Pain achieves 6/10 with walking over 15-41mns and with standing upright. Is currently completing resistance exercise and walking in the gym 4-5x/week. Pt is retired, watches her 3 grandchildren ages [id number] old. Pt denies N/V, B&B changes, unexplained weight fluctuation, saddle paresthesia, fever, night sweats, or unrelenting night pain at this time.   PAIN:  Are you having pain? Yes: NPRS scale: 0/10 Pain location: midline low  back Pain description: dull ache Aggravating factors: walking >157m, standing upright Relieving factors: sitting/resting   PRECAUTIONS: None  WEIGHT BEARING RESTRICTIONS No  FALLS:  Has patient fallen in last 6 months? No  LIVING ENVIRONMENT: Lives with: lives with their spouse Lives in: House/apartment Stairs: Yes: Internal: 5  steps; on left going up Has following equipment at home: None  OCCUPATION:  retired  PLOF: Independent  PATIENT GOALS walk with more comfort   OBJECTIVE:   DIAGNOSTIC FINDINGS:  Not since last episode  PATIENT SURVEYS:  FOTO 58; goal of 6253SCREENING FOR RED FLAGS: Bowel or bladder incontinence: No Spinal tumors: No Cauda equina syndrome: No Compression fracture: No Abdominal aneurysm: No  COGNITION:  Overall cognitive status: Within functional limits for tasks assessed     SENSATION: WFL  MUSCLE LENGTH: Hamstrings: 25% limited bilat with tension at end range Thomas test: shortened bilat   POSTURE: decreased lumbar lordosis, increased thoracic kyphosis, and flexed trunk   PALPATION: TTP concordant pain to palpation of R lumbar paraspinals/QL and bilat superior glute/glute med  LUMBAR ROM:     A/PROM  eval  Flexion WNL  Extension 25% limited  Right lateral flexion WNL  Left lateral flexion WNL  Right rotation   Left rotation    (Blank rows = not tested)  LOWER EXTREMITY ROM:     PROM/ AROM  Right eval Left eval  Hip flexion WNL bilat  Hip extension 10/0d bilat  Hip abduction    Hip adduction    Hip internal rotation Limited 25% bilat  Hip external rotation Limited 25% bilat  Knee flexion    Knee extension    Ankle dorsiflexion    Ankle plantarflexion    Ankle inversion    Ankle eversion     (Blank rows = not tested)  LOWER EXTREMITY MMT:    MMT Right eval Left eval  Hip flexion 5 5  Hip extension 4- 4-  Hip abduction 4 4  Hip adduction    Hip internal rotation 5 5  Hip external rotation 4+ 4+  Knee flexion 4+ 4+  Knee extension 5 5  Ankle dorsiflexion 5 5  Ankle plantarflexion 5 5  Ankle inversion    Ankle eversion     (Blank rows = not tested)  LUMBAR SPECIAL TESTS:  ThMarcello Mooresest: Positive  FUNCTIONAL TESTS:  5 times sit to stand: 13sec 6 minute walk test: 1575 10 meter walk test: >1.7m60mSLS <3sec  bilat  GAIT: Distance walked: 1575 Assistive device utilized: None  Level of assistance: Complete Independence Comments: concordant pain 13mn into 6MWT; gait with maintained forward flex Steps: step to with unilateral handrail    TODAY'S TREATMENT  Ther-Ex Nustep L3 556ms for gentle lumbar rotation and strengthening  Squat to mat table 2x 10 with cuing for technique with good carry over  Bridge x10 with ease; SL bridge x10 bilat with cuing for glute activaiton without excessive lumbar ext compensation  Marching bridge x12 with cuing to maintain hip ext  Fwd and bwd monster walks 2x 2053fith RTB with good carry ove rof initial demo  PT reviewed the following HEP with patient with patient able to demonstrate a set of the following with min cuing for correction needed. PT educated patient on parameters of therex (how/when to inc/decrease intensity, frequency, rep/set range, stretch hold time, and purpose of therex) with verbalized understanding.   - Squat with Chair Touch  - 1 x daily - 2 x weekly - 3 sets - 10-12 reps - Single Leg Bridge  - 1 x daily - 2 x weekly - 3 sets - 10-12 reps - Marching Bridge  - 1 x daily - 7 x weekly - 3 sets - 10-12 reps - Standing Hip Hinge  - 1 x daily - 2 x weekly - 3 sets - 10-12 reps - Forward Monster Walks  - 1 x daily - 2 x weekly - 3 sets - 12 reps - Backward Monster Walks  - 1 x daily - 2 x weekly - 3 sets - 12 reps   PATIENT EDUCATION:  Education details: Patient was educated on diagnosis, anatomy and pathology involved, prognosis, role of PT, and was given an HEP, demonstrating exercise with proper form following verbal and tactile cues, and was given a paper hand out to continue exercise at home. Pt was educated on and agreed to plan of care.  Person educated: Patient Education method: Explanation, Demonstration, and Handouts Education comprehension: verbalized understanding, returned demonstration, and verbal cues required   HOME EXERCISE  PROGRAM:  [id number]  ASSESSMENT:  CLINICAL IMPRESSION: PT continued therex progression and HEP updates for increasing corea nd hip stability (with hip ext strengthening focus) with success. Patient is able to demonstrate and verbalize understanding of HEP. Due to new medical diagnosis, pt and MD would like pt to continue to be seen for fall risk, which is entirely appropriate. PT will complete formal evaluation of falls risk next session, and continue PT sessions for this as well.      OBJECTIVE IMPAIRMENTS Abnormal gait, decreased activity tolerance, decreased balance, decreased coordination, decreased endurance, decreased mobility, difficulty walking, decreased ROM, decreased strength, increased fascial restrictions, increased muscle spasms, impaired tone, improper body mechanics, postural dysfunction, and pain.   ACTIVITY LIMITATIONS carrying, lifting, bending, squatting, stairs, transfers, and locomotion level  PARTICIPATION LIMITATIONS: meal prep, cleaning, laundry, community activity, occupation, and yard work  PERSONAL FACTORS Age, Fitness, Past/current experiences, Time since onset of injury/illness/exacerbation, and 3+ comorbidities: chronic pain, HLD, HTN  are also affecting patient's functional outcome.   REHAB POTENTIAL: Good  CLINICAL DECISION MAKING: Evolving/moderate complexity  EVALUATION COMPLEXITY: Moderate   GOALS: Goals reviewed with patient? Yes  SHORT TERM GOALS: Target date: 10/01/2022  Pt will be independent with HEP in order to improve strength and balance in order to decrease fall risk and improve function at home and work.  Baseline: 07/29/22 HEP given  Goal status: INITIAL    LONG TERM GOALS: Target date: 10/29/2022  Pt will increase 6MWT by at least  29m(1612f in order to demonstrate clinically significant improvement in cardiopulmonary endurance and community ambulation   Baseline: 8/10/233 1575 Goal status: INITIAL  2.  Pt will decrease worst  pain as reported on NPRS by at least 3 points in order to demonstrate clinically significant reduction in pain.  Baseline: 07/29/22 Goal status: INITIAL  3.  Patient will increase FOTO score to 62 to demonstrate predicted increase in functional mobility to complete ADLs  Baseline: 07/29/22 58 Goal status: INITIAL   PLAN: PT FREQUENCY: 1-2x/week  PT DURATION: 8 weeks  PLANNED INTERVENTIONS: Therapeutic exercises, Therapeutic activity, Neuromuscular re-education, Balance training, Gait training, Patient/Family education, Self Care, Joint mobilization, Joint manipulation, Stair training, Dry Needling, Electrical stimulation, Spinal manipulation, Spinal mobilization, Cryotherapy, Moist heat, Traction, Ultrasound, Fluidotherapy, Manual therapy, and Re-evaluation.  PLAN FOR NEXT SESSION: HEP review, build onto a gym HEP  ChDurwin RegesPT  ChDurwin RegesPT 09/03/2022, 7:57 AM

## 2022-09-07 ENCOUNTER — Encounter: Payer: Self-pay | Admitting: Neurosurgery

## 2022-09-07 ENCOUNTER — Ambulatory Visit: Payer: PPO | Admitting: Neurosurgery

## 2022-09-07 VITALS — BP 124/74 | Ht 64.0 in | Wt 160.4 lb

## 2022-09-07 DIAGNOSIS — G918 Other hydrocephalus: Secondary | ICD-10-CM | POA: Diagnosis not present

## 2022-09-07 DIAGNOSIS — R2689 Other abnormalities of gait and mobility: Secondary | ICD-10-CM | POA: Diagnosis not present

## 2022-09-07 DIAGNOSIS — G911 Obstructive hydrocephalus: Secondary | ICD-10-CM

## 2022-09-07 NOTE — Progress Notes (Signed)
Primary Physician:  Maryland Pink, MD   History of Present Illness: 09/07/2022 Ms. Michelle Cisneros presents without any new symptoms.   08/16/2022 Ms. Michelle Cisneros is here today with a chief complaint of falls.  She has been having more falls lately.  She has also had a mildly altered mental status, though the patient denies this.  She had a mechanical fall in her home, and has a facial abrasion.  She reports chronic urinary incontinence.     She feels slightly off balance.   During childhood, she was noted to have a large head circumference. Her mother told her later that doctors were worried she had encephalitis.   Review of Systems:  A 10 point review of systems is negative, except for the pertinent positives and negatives detailed in the HPI.   Past Medical History:     Past Medical History:  Diagnosis Date   Aortic atherosclerosis (HCC)     Arthritis     Back pain     Basal cell carcinoma      L forehead, txted in past by another provider   Collagen vascular disease ([place])     Depression     DOE (dyspnea on exertion)      a. 05/2020 Echo: EF 60-65%, no rwma, nl RV size/fxn.   History of stress test      a. 04/2020 Lexiscan MV: No ischemia/infarct.   Hyperlipemia     Hypertension     Melanoma ([place]) ~2015    R forearm txted in past by another provider   Recurrent UTI     Squamous cell carcinoma of skin      L med ankle, txted in past by another provider      Past Surgical History:      Past Surgical History:  Procedure Laterality Date   ABDOMINAL HYSTERECTOMY       COLONOSCOPY WITH PROPOFOL N/A 05/26/2020    Procedure: COLONOSCOPY WITH PROPOFOL;  Surgeon: Toledo, Benay Pike, MD;  Location: [facility] ENDOSCOPY;  Service: Gastroenterology;  Laterality: N/A;   INCONTINENCE SURGERY       OOPHORECTOMY          Allergies:      Medications: Current Meds  Medication Sig   aspirin EC 81 MG tablet Take 1 tablet (81 mg total) by mouth daily.   atorvastatin (LIPITOR) 10 MG tablet  TAKE ONE TABLET BY MOUTH DAILY   buPROPion (WELLBUTRIN XL) 300 MG 24 hr tablet TAKE ONE TABLET BY MOUTH DAILY   Calcium Carbonate-Vitamin D 600-400 MG-UNIT tablet Take 1 tablet by mouth daily.   fluticasone (FLONASE) 50 MCG/ACT nasal spray Place 2 sprays into both nostrils daily.   lisinopril (ZESTRIL) 20 MG tablet Take 1 tablet (20 mg total) by mouth daily.   metroNIDAZOLE (METROCREAM) 0.75 % cream Apply to face once to twice daily for rosacea.   nitrofurantoin, macrocrystal-monohydrate, (MACROBID) 100 MG capsule Take 1 capsule (100 mg total) by mouth daily.    Social History: Social History         Tobacco Use   Smoking status: Former      Packs/day: 1.00      Years: 30.00      Total pack years: 30.00      Types: Cigarettes      Quit date: 10/04/1987      Years since quitting: 34.8   Smokeless tobacco: Never  Vaping Use   Vaping Use: Never used  Substance Use Topics   Alcohol use: Yes  Comment: occassional; once glass of wine   Drug use: Not Currently      Family Medical History:      Family History  Problem Relation Age of Onset   Cancer Father          laryngeal   Atrial fibrillation Father     Stroke Mother     Breast cancer Neg Hx     Bladder Cancer Neg Hx     Kidney cancer Neg Hx        Physical Examination:     Vitals:    08/16/22 1000 08/16/22 1030  BP: (!) 154/78 (!) 143/88  Pulse: 63 65  Resp:      Temp:      SpO2: 99% 100%      General:          Patient is well developed, well nourished, calm, collected, and in no apparent distress. Attention to examination is appropriate.   Neck:               Supple.  Full range of motion.   Respiratory:     Patient is breathing without any difficulty.     NEUROLOGICAL:     Awake, alert, oriented to person, place, and time.  Speech is clear and fluent. Fund of knowledge is appropriate.    Cranial Nerves: Pupils equal round and reactive to light.  Facial tone is symmetric.  Facial sensation is  symmetric. Shoulder shrug is symmetric. Tongue protrusion is midline.  There is no pronator drift.       Strength: Side Biceps Triceps Deltoid Interossei Grip Wrist Ext. Wrist Flex.  R '5 5 5 5 5 5 5  '$ L '5 5 5 5 5 5 5    '$ Side Iliopsoas Quads Hamstring PF DF EHL  R '5 5 5 5 5 5  '$ L '5 5 5 5 5 5    '$ Reflexes are 1+ and symmetric at the biceps, triceps, brachioradialis, patella and achilles.   Hoffman's is absent.  Bilateral upper and lower extremity sensation is intact to light touch.      Gait is untested   Medical Decision Making   Imaging: MRI Brain 08/16/2022 IMPRESSION: 1. No acute intracranial abnormality. 2. 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 3. 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 4. 1 cm right MCA bifurcation aneurysm.     Electronically Signed   By: Logan Bores M.D.   On: 08/16/2022 08:46   I have personally reviewed the images and agree with the above interpretation.   Assessment and Plan: Ms. Michelle Cisneros is a pleasant 66 y.o. 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    She has imaging features that suggest chronic hydrocephalus, as well as history that suggests enlarged head circumference from childhood.    She has some chronic imbalance.  I will send her back to physical therapy for that.  We will repeat her MRI scan in 6 months to confirm stable findings.  I spent a total of 15 minutes in face-to-face and non-face-to-face activities related to this patient's care today.   Thank you for involving me in the care of this patient.         Aquita Simmering K. Izora Ribas MD, [facility] Neurosurgery

## 2022-09-21 ENCOUNTER — Ambulatory Visit: Payer: PPO | Admitting: Physical Therapy

## 2022-10-19 ENCOUNTER — Ambulatory Visit: Payer: PPO | Admitting: Physical Therapy

## 2022-10-22 ENCOUNTER — Ambulatory Visit: Payer: PPO | Attending: Neurosurgery | Admitting: Physical Therapy

## 2022-10-22 ENCOUNTER — Encounter: Payer: PPO | Admitting: Physical Therapy

## 2022-10-22 ENCOUNTER — Encounter: Payer: Self-pay | Admitting: Physical Therapy

## 2022-10-22 DIAGNOSIS — G911 Obstructive hydrocephalus: Secondary | ICD-10-CM | POA: Insufficient documentation

## 2022-10-22 DIAGNOSIS — R269 Unspecified abnormalities of gait and mobility: Secondary | ICD-10-CM | POA: Insufficient documentation

## 2022-10-22 DIAGNOSIS — R2689 Other abnormalities of gait and mobility: Secondary | ICD-10-CM | POA: Insufficient documentation

## 2022-10-22 NOTE — Therapy (Signed)
OUTPATIENT PHYSICAL THERAPY NEURO EVALUATION   Patient Name: Michelle Cisneros MRN: [id number] DOB:[birth year], 66 y.o., female Today's Date: 10/22/2022   PCP: Maryland Pink, MD  REFERRING PROVIDER: Meade Maw, MD    PT End of Session - 10/22/22 0914     Visit Number 1    Number of Visits 13    Date for PT Re-Evaluation 12/03/22    Authorization - Visit Number 1    Progress Note Due on Visit 10    PT Start Time 0916    PT Stop Time 1000    PT Time Calculation (min) 44 min    Equipment Utilized During Treatment Gait belt    Activity Tolerance Patient tolerated treatment well    Behavior During Therapy WFL for tasks assessed/performed             Past Medical History:  Diagnosis Date   Aortic atherosclerosis ([place])    Arthritis    Back pain    Basal cell carcinoma    L forehead, txted in past by another provider   Collagen vascular disease ([place])    Depression    DOE (dyspnea on exertion)    a. 05/2020 Echo: EF 60-65%, no rwma, nl RV size/fxn.   History of stress test    a. 04/2020 Lexiscan MV: No ischemia/infarct.   Hyperlipemia    Hypertension    Melanoma ([employer]) ~2015   R forearm txted in past by another provider   Recurrent UTI    Squamous cell carcinoma of skin    L med ankle, txted in past by another provider   Past Surgical History:  Procedure Laterality Date   ABDOMINAL HYSTERECTOMY     COLONOSCOPY WITH PROPOFOL N/A 05/26/2020   Procedure: COLONOSCOPY WITH PROPOFOL;  Surgeon: Toledo, Benay Pike, MD;  Location: [facility] ENDOSCOPY;  Service: Gastroenterology;  Laterality: N/A;   INCONTINENCE SURGERY     OOPHORECTOMY     Patient Active Problem List   Diagnosis Date Noted   Recurrent falls 08/16/2022   Hydrocephalus ([place]) 08/16/2022   Depression 08/16/2022   OSA (obstructive sleep apnea) 04/27/2022   Palpitations 10/03/2021   Dyspnea on exertion 04/19/2020   Aortic atherosclerosis ([place]) 04/19/2020   Essential hypertension 04/19/2020    Hyperlipidemia 04/19/2020   Right carpal tunnel syndrome 11/09/2016    ONSET DATE: August 28th  REFERRING DIAG: Balance  THERAPY DIAG:  Other abnormalities of gait and mobility  Rationale for Evaluation and Treatment: Rehabilitation  SUBJECTIVE:  SUBJECTIVE STATEMENT: Balance concerns Pt accompanied by: self  PERTINENT HISTORY: Fall August 28th 2023, hospital, face and head trauma, severe  hydrocephalus, neurosurgeon, mri born with, brain is handling it well, daughter OT, balance, just 2 falls, not sure if its a progressive disease, pt does not feel she has symptoms, fractured back in 2019. Pt was using AD after back fracture. Has dog that she takes walks on about an hour per day. Watches three grandkids everyday 6, 5, and 3.   PRECAUTIONS: Back was seen early  WEIGHT BEARING RESTRICTIONS: No  FALLS: Has patient fallen in last 6 months? Yes. Number of falls August 28th   LIVING ENVIRONMENT: Lives with: lives with their family and lives with their spouse Lives in: House/apartment Stairs: Yes: Internal: going up easier than coming down steps; one side Has following equipment at home: None  PLOF: Independent  PATIENT GOALS: Pt would like to progress yoga routine, tree pose  OBJECTIVE:   DIAGNOSTIC FINDINGS:  MRI of brain indicates severe hydrocephaly.    COGNITION: Overall cognitive status: Within functional limits for tasks assessed   SENSATION: Not tested  COORDINATION: Some coordination issues noted during FGA, step corrections for catching balance    MUSCLE LENGTH: Hamstrings: Right  deg; Left  deg Marcello Moores test: Right  deg; Left  deg   POSTURE: rounded shoulders, forward head, decreased lumbar lordosis, increased thoracic kyphosis, and flexed trunk   LOWER EXTREMITY ROM:      Not formally tested evident decreased hip ext AROM bilat seen in lack of terminal stance in standing and gait  LOWER EXTREMITY MMT:   Not formally tested; 5xSTS  STAIRS: Level of Assistance: Complete Independence Stair Negotiation Technique: Step to Pattern Alternating Pattern  with No Rails Single Rail on Left Number of Stairs: 8  Comments: Pt is able to complete stairs with alternating gait pattern without handrails when prompted but reports using handrail and step to strategies when at home and in the community.  GAIT: Gait pattern: WFL Distance walked: 114f Assistive device utilized: None Level of assistance: Complete Independence Comments: Pt self ambulatory, minor compensations, further assess gait next session Observation: forward flex, wide BOS, decreased stride throughout gait (stenosis gait pattern)  FUNCTIONAL TESTS:  30 seconds chair stand test Timed up and go (TUG): 8.9 Functional gait assessment: 23/30 30sec chair stand test 10  PATIENT SURVEYS:  FOTO 50/62  TODAY'S TREATMENT:                                                                                                                              DATE:10/22/2022    PT assess patients balance during initial evaluation by administering a variety of tests, outcome measures, and movements.  30 second chair STS (10 reps completed) Functional gait analysis (23/30) TUG 8.5 seconds  Standing at treadmill balance exercises: Narrow base EO 10 sec Semi tandem R in front EO 10 sec Semi tandem L in front EO 10 sec Tandem  R in front EO 10 sec Tandem L in front EO 10 sec SLS EO L 3 seconds at best (5 attempts) SLS EO R 30 seconds   Foam: Semi tandem R in front EO 10 sec Semi tandem L in front EO 10 sec Semi tandem R in front EC 7 sec Semi tandem L in front EC 8 sec  Ther-Ex PT reviewed the following HEP with patient with patient able to demonstrate a set of the following with min cuing for correction  needed. PT educated patient on parameters of therex (how/when to inc/decrease intensity, frequency, rep/set range, stretch hold time, and purpose of therex) with verbalized understanding.  3x/week squat to chair 3x 12-20 7x/week SLS 2x/day 30sec  Education on components of balance/fall risk identified in outcome measures and therex aim to decrease fall risk   PATIENT EDUCATION: Education details: Patient educated in HEP (squat into chair barely touching down, and single leg stance). Pt voices verbal  Person educated: Patient Education method: Explanation, Demonstration, Verbal cues, and Handouts Education comprehension: verbalized understanding  HOME EXERCISE PROGRAM: Insert code here with exercises  GOALS: Goals reviewed with patient? No  SHORT TERM GOALS: Target date: 11/19/2022  Pt will be able to walk tandem for 10 consecutive steps (heel to toe) to demonstrate improved dynamic balance for increased community ambulation. Baseline: 4 (seen on FGA) Goal status: INITIAL      LONG TERM GOALS: Target date: 12/17/2022  Pt will improve FGA score to at least 27 for improved balance during dynamic gait activities. Baseline: 23/30 Goal status: INITIAL  2.  Pt will increase SLS of LLE to 30 seconds to address defictis of LLE compared to the RLE for decreased fall risk. Baseline: 5 sec Goal status: INITIAL  3.  Patient will increase FOTO score to 62 to demonstrate predicted increase in functional mobility to complete ADLs  Baseline: 50 Goal status: INITIAL  4.  Pt will complete >11 STS in 30sec in order to achieve age-matched endurance needed for community activity  Baseline: 10 Goal status: INITIAL      ASSESSMENT:  CLINICAL IMPRESSION: Patient is a 66 y.o. female who was seen today for physical therapy evaluation and treatment for balance and fall prevention. Pt at cutoff for fall risk for FGA, but with evident balance impairments in confines of visual changes, stair  ambulation, and change of BOS (tandem and SLS positions). Impairments in BLE endurance, hip ext strength, abnormal gait, abnormal posture, static balance, motor control/planning and dynamic balance. Activity limitations in stair ambulation, community distance gait, lifting, functional transfers; inhibiting full participation in yoga, community ADLS, and caring for grandchildren. Would benefit from skilled PT to address above deficits and promote optimal return to PLOF.   OBJECTIVE IMPAIRMENTS: Abnormal gait. BLE endurance, hip ext strength, abnormal gait, abnormal posture, static balance, motor control/planning and dynamic balance.  ACTIVITY LIMITATIONS: sitting, squatting, and stairs  PARTICIPATION LIMITATIONS: cleaning, shopping, community activity, and yard work  PERSONAL FACTORS: Age and Fitness are also affecting patient's functional outcome.   REHAB POTENTIAL: Good  CLINICAL DECISION MAKING: Stable/uncomplicated  EVALUATION COMPLEXITY: Moderate  PLAN:  PT FREQUENCY: 1-2x/week  PT DURATION: 6 weeks  PLANNED INTERVENTIONS: Therapeutic exercises, Therapeutic activity, Neuromuscular re-education, Balance training, Gait training, Patient/Family education, and Self Care  PLAN FOR NEXT SESSION: Progress balance training, and further investigate gait compensations.  Edison Nasuti A. Laurance Flatten, SPT  Durwin Reges DPT  Durwin Reges, PT 10/22/2022, 11:39 AM

## 2022-10-25 ENCOUNTER — Ambulatory Visit: Payer: PPO | Admitting: Physical Therapy

## 2022-10-25 ENCOUNTER — Encounter: Payer: Self-pay | Admitting: Physical Therapy

## 2022-10-25 DIAGNOSIS — R2689 Other abnormalities of gait and mobility: Secondary | ICD-10-CM

## 2022-10-25 DIAGNOSIS — R269 Unspecified abnormalities of gait and mobility: Secondary | ICD-10-CM

## 2022-10-25 NOTE — Therapy (Signed)
OUTPATIENT PHYSICAL THERAPY NEURO TREATMENT   Patient Name: Michelle Cisneros MRN: [id number] DOB:[birth year], 66 y.o., female Today's Date: 10/26/2022   PCP: Maryland Pink, MD  REFERRING PROVIDER: Meade Maw, MD    PT End of Session - 10/25/22 0914     Visit Number 2    Number of Visits 13    Date for PT Re-Evaluation 12/03/22    Authorization - Visit Number 2    Authorization - Number of Visits 10    Progress Note Due on Visit 10    PT Start Time 0915    PT Stop Time 1000    PT Time Calculation (min) 45 min    Equipment Utilized During Treatment Gait belt    Activity Tolerance Patient tolerated treatment well    Behavior During Therapy WFL for tasks assessed/performed              Past Medical History:  Diagnosis Date   Aortic atherosclerosis ([place])    Arthritis    Back pain    Basal cell carcinoma    L forehead, txted in past by another provider   Collagen vascular disease ([place])    Depression    DOE (dyspnea on exertion)    a. 05/2020 Echo: EF 60-65%, no rwma, nl RV size/fxn.   History of stress test    a. 04/2020 Lexiscan MV: No ischemia/infarct.   Hyperlipemia    Hypertension    Melanoma ([place]) ~2015   R forearm txted in past by another provider   Recurrent UTI    Squamous cell carcinoma of skin    L med ankle, txted in past by another provider   Past Surgical History:  Procedure Laterality Date   ABDOMINAL HYSTERECTOMY     COLONOSCOPY WITH PROPOFOL N/A 05/26/2020   Procedure: COLONOSCOPY WITH PROPOFOL;  Surgeon: Toledo, Benay Pike, MD;  Location: [facility] ENDOSCOPY;  Service: Gastroenterology;  Laterality: N/A;   INCONTINENCE SURGERY     OOPHORECTOMY     Patient Active Problem List   Diagnosis Date Noted   Recurrent falls 08/16/2022   Hydrocephalus ([place]) 08/16/2022   Depression 08/16/2022   OSA (obstructive sleep apnea) 04/27/2022   Palpitations 10/03/2021   Dyspnea on exertion 04/19/2020   Aortic atherosclerosis ([place]) 04/19/2020   Essential  hypertension 04/19/2020   Hyperlipidemia 04/19/2020   Right carpal tunnel syndrome 11/09/2016    ONSET DATE: August 28th  REFERRING DIAG: Balance  THERAPY DIAG:  Abnormality of gait and mobility  Other abnormalities of gait and mobility  Rationale for Evaluation and Treatment: Rehabilitation  SUBJECTIVE:  SUBJECTIVE STATEMENT: Pt reports no lapses in balance this weekend with no falls. Reports having a good weekend with grand kids. Grandsons birthday today.  PERTINENT HISTORY: Fall August 28th 2023, hospital, face and head trauma, severe  hydrocephalus, neurosurgeon, mri born with, brain is handling it well, daughter OT, balance, just 2 falls, not sure if its a progressive disease, pt does not feel she has symptoms, fractured back in 2019. Pt was using AD after back fracture. Has dog that she takes walks on about an hour per day. Watches three grandkids everyday 6, 5, and 3.   PRECAUTIONS: Back was seen early  WEIGHT BEARING RESTRICTIONS: No  FALLS: Has patient fallen in last 6 months? Yes. Number of falls August 28th   LIVING ENVIRONMENT: Lives with: lives with their family and lives with their spouse Lives in: House/apartment Stairs: Yes: Internal: going up easier than coming down steps; one side Has following equipment at home: None  PLOF: Independent  PATIENT GOALS: Pt would like to progress yoga routine, tree pose  OBJECTIVE:   DIAGNOSTIC FINDINGS:  MRI of brain indicates severe hydrocephaly.    COGNITION: Overall cognitive status: Within functional limits for tasks assessed   SENSATION: Not tested  COORDINATION: Some coordination issues noted during FGA, step corrections for catching balance    MUSCLE LENGTH: Hamstrings: Right  deg; Left  deg Marcello Moores test: Right  deg;  Left  deg   POSTURE: rounded shoulders, forward head, decreased lumbar lordosis, increased thoracic kyphosis, and flexed trunk   LOWER EXTREMITY ROM:     Not formally tested evident decreased hip ext AROM bilat seen in lack of terminal stance in standing and gait  LOWER EXTREMITY MMT:   Not formally tested; 5xSTS  STAIRS: Level of Assistance: Complete Independence Stair Negotiation Technique: Step to Pattern Alternating Pattern  with No Rails Single Rail on Left Number of Stairs: 8  Comments: Pt is able to complete stairs with alternating gait pattern without handrails when prompted but reports using handrail and step to strategies when at home and in the community.  GAIT: Gait pattern: WFL Distance walked: 121f Assistive device utilized: None Level of assistance: Complete Independence Comments: Pt self ambulatory, minor compensations, further assess gait next session Observation: forward flex, wide BOS, decreased stride throughout gait (stenosis gait pattern)  FUNCTIONAL TESTS:  30 seconds chair stand test Timed up and go (TUG): 8.9 Functional gait assessment: 23/30 30sec chair stand test 10  PATIENT SURVEYS:  FOTO 50/62  TODAY'S TREATMENT:                                                                                                                              DATE:10/22/2022    Treadmill 5 min, 1.0 incline, 2.8 mph Bosu ball lunges bilaterally 1 set, 8 reps Single leg stance exercises reaching/catching with trampoline  Walking exercises with 3# ankle weights (35 feet) Forwards EC (pt cued for deviations left and right of walkway) Backwards EO (  pt cued for increased step length) [place] to the left (pt requires demonstration and verbal cues for the correct sequence) [place] to the right (one mis step required CGA) Heel to toe EO forward (20 feet) Heel to toe EO backward (20 feet) (pt cued to keep feet close, touching heel to toe) Walking in figure 8s forwards w  cones (15 feet apart) pt requires demonstration of execution with good carry over Walking in figure 8s backwards w cones (15 feet apart)  Standing hip strengthening exercises: Standing Hip Abduction Bilaterally 2 sets of 10 reps (no UE support needed) Standing Hip Extension Bilaterally 2 sets of 10 reps (UE support utilized) Standing Hip Flexion Bilaterally 2 sets of 10 reps (UE support utilized) (Pt required cueing for proper rotation of LE during exercises, toe out noted especially during hip abduction movement.)    PATIENT EDUCATION: Education details: Patient educated in HEP (squat into chair barely touching down, and single leg stance). Pt voices verbal  Person educated: Patient Education method: Explanation, Demonstration, Verbal cues, and Handouts Education comprehension: verbalized understanding  HOME EXERCISE PROGRAM: Insert code here with exercises  GOALS: Goals reviewed with patient? No  SHORT TERM GOALS: Target date: 11/23/2022  Pt will be able to walk tandem for 10 consecutive steps (heel to toe) to demonstrate improved dynamic balance for increased community ambulation. Baseline: 4 (seen on FGA) Goal status: INITIAL      LONG TERM GOALS: Target date: 12/21/2022  Pt will improve FGA score to at least 27 for improved balance during dynamic gait activities. Baseline: 23/30 Goal status: INITIAL  2.  Pt will increase SLS of LLE to 30 seconds to address defictis of LLE compared to the RLE for decreased fall risk. Baseline: 5 sec Goal status: INITIAL  3.  Patient will increase FOTO score to 62 to demonstrate predicted increase in functional mobility to complete ADLs  Baseline: 50 Goal status: INITIAL  4.  Pt will complete >11 STS in 30sec in order to achieve age-matched endurance needed for community activity  Baseline: 10 Goal status: INITIAL      ASSESSMENT:  CLINICAL IMPRESSION: PT initiated balance and strength therapeutic exercises to address  patients balance deficits with success. Patient demonstrates ability to complete all prescribed therapeutic exercises during today's session. Pt also demonstrates some discomfort in L knee during lunge exercises. Pt voices concern and is very cautious during therex involving walking backwards specifically figure 8s and heel to toe walking. Patient will continue to benefit from skilled physical therapy to improve strength of bilateral LES and balance for increased independence with ADL and to decrease fall risk.    OBJECTIVE IMPAIRMENTS: Abnormal gait. BLE endurance, hip ext strength, abnormal gait, abnormal posture, static balance, motor control/planning and dynamic balance.  ACTIVITY LIMITATIONS: sitting, squatting, and stairs  PARTICIPATION LIMITATIONS: cleaning, shopping, community activity, and yard work  PERSONAL FACTORS: Age and Fitness are also affecting patient's functional outcome.   REHAB POTENTIAL: Good  CLINICAL DECISION MAKING: Stable/uncomplicated  EVALUATION COMPLEXITY: Moderate  PLAN:  PT FREQUENCY: 1-2x/week  PT DURATION: 6 weeks  PLANNED INTERVENTIONS: Therapeutic exercises, Therapeutic activity, Neuromuscular re-education, Balance training, Gait training, Patient/Family education, and Self Care  PLAN FOR NEXT SESSION: Step overs with increased SLS, progress hip strengthening  Edison Nasuti A. Laurance Flatten, SPT  Durwin Reges DPT  Durwin Reges, PT 10/26/2022, 8:21 AM

## 2022-10-26 ENCOUNTER — Encounter: Payer: Self-pay | Admitting: Physical Therapy

## 2022-10-29 ENCOUNTER — Encounter: Payer: Self-pay | Admitting: Physical Therapy

## 2022-10-29 ENCOUNTER — Ambulatory Visit: Payer: PPO | Admitting: Physical Therapy

## 2022-10-29 DIAGNOSIS — R2689 Other abnormalities of gait and mobility: Secondary | ICD-10-CM

## 2022-10-29 DIAGNOSIS — R269 Unspecified abnormalities of gait and mobility: Secondary | ICD-10-CM

## 2022-10-29 NOTE — Therapy (Signed)
OUTPATIENT PHYSICAL THERAPY NEURO TREATMENT   Patient Name: Michelle Cisneros MRN: [id number] DOB:1956-12-02, 66 y.o., female Today's Date: 11/01/2022   PCP: Maryland Pink, MD  REFERRING PROVIDER: Meade Maw, MD    PT End of Session - 11/01/22 1349     Visit Number 3    Number of Visits 13    Date for PT Re-Evaluation 12/03/22    Authorization - Visit Number 3    Authorization - Number of Visits 10    Progress Note Due on Visit 10    PT Start Time 1001    PT Stop Time 1040    PT Time Calculation (min) 39 min    Equipment Utilized During Treatment Gait belt    Activity Tolerance Patient tolerated treatment well    Behavior During Therapy WFL for tasks assessed/performed               Past Medical History:  Diagnosis Date   Aortic atherosclerosis ([place])    Arthritis    Back pain    Basal cell carcinoma    L forehead, txted in past by another provider   Collagen vascular disease ([place])    Depression    DOE (dyspnea on exertion)    a. 05/2020 Echo: EF 60-65%, no rwma, nl RV size/fxn.   History of stress test    a. 04/2020 Lexiscan MV: No ischemia/infarct.   Hyperlipemia    Hypertension    Melanoma ([place]) ~2015   R forearm txted in past by another provider   Recurrent UTI    Squamous cell carcinoma of skin    L med ankle, txted in past by another provider   Past Surgical History:  Procedure Laterality Date   ABDOMINAL HYSTERECTOMY     COLONOSCOPY WITH PROPOFOL N/A 05/26/2020   Procedure: COLONOSCOPY WITH PROPOFOL;  Surgeon: Toledo, Benay Pike, MD;  Location: [facility] ENDOSCOPY;  Service: Gastroenterology;  Laterality: N/A;   INCONTINENCE SURGERY     OOPHORECTOMY     Patient Active Problem List   Diagnosis Date Noted   Recurrent falls 08/16/2022   Hydrocephalus ([place]) 08/16/2022   Depression 08/16/2022   OSA (obstructive sleep apnea) 04/27/2022   Palpitations 10/03/2021   Dyspnea on exertion 04/19/2020   Aortic atherosclerosis ([place]) 04/19/2020    Essential hypertension 04/19/2020   Hyperlipidemia 04/19/2020   Right carpal tunnel syndrome 11/09/2016    ONSET DATE: August 28th  REFERRING DIAG: Balance  THERAPY DIAG:  Abnormality of gait and mobility  Other abnormalities of gait and mobility  Rationale for Evaluation and Treatment: Rehabilitation  SUBJECTIVE:  SUBJECTIVE STATEMENT: Pt reports no stumbling or falls. Pt reports having plans with grandson after session today. Pt reports poor nights sleep last night.  PERTINENT HISTORY: Fall August 28th 2023, hospital, face and head trauma, severe  hydrocephalus, neurosurgeon, mri born with, brain is handling it well, daughter OT, balance, just 2 falls, not sure if its a progressive disease, pt does not feel she has symptoms, fractured back in 2019. Pt was using AD after back fracture. Has dog that she takes walks on about an hour per day. Watches three grandkids everyday 6, 5, and 3.   PRECAUTIONS: Back was seen early  WEIGHT BEARING RESTRICTIONS: No  FALLS: Has patient fallen in last 6 months? Yes. Number of falls August 28th   LIVING ENVIRONMENT: Lives with: lives with their family and lives with their spouse Lives in: House/apartment Stairs: Yes: Internal: going up easier than coming down steps; one side Has following equipment at home: None  PLOF: Independent  PATIENT GOALS: Pt would like to progress yoga routine, tree pose  OBJECTIVE:   DIAGNOSTIC FINDINGS:  MRI of brain indicates severe hydrocephaly.    COGNITION: Overall cognitive status: Within functional limits for tasks assessed   SENSATION: Not tested  COORDINATION: Some coordination issues noted during FGA, step corrections for catching balance    MUSCLE LENGTH: Hamstrings: Right  deg; Left  deg Marcello Moores test:  Right  deg; Left  deg   POSTURE: rounded shoulders, forward head, decreased lumbar lordosis, increased thoracic kyphosis, and flexed trunk   LOWER EXTREMITY ROM:     Not formally tested evident decreased hip ext AROM bilat seen in lack of terminal stance in standing and gait  LOWER EXTREMITY MMT:   Not formally tested; 5xSTS  STAIRS: Level of Assistance: Complete Independence Stair Negotiation Technique: Step to Pattern Alternating Pattern  with No Rails Single Rail on Left Number of Stairs: 8  Comments: Pt is able to complete stairs with alternating gait pattern without handrails when prompted but reports using handrail and step to strategies when at home and in the community.  GAIT: Gait pattern: WFL Distance walked: 154f Assistive device utilized: None Level of assistance: Complete Independence Comments: Pt self ambulatory, minor compensations, further assess gait next session Observation: forward flex, wide BOS, decreased stride throughout gait (stenosis gait pattern)  FUNCTIONAL TESTS:  30 seconds chair stand test Timed up and go (TUG): 8.9 Functional gait assessment: 23/30 30sec chair stand test 10  PATIENT SURVEYS:  FOTO 50/62  TODAY'S TREATMENT:                                                                                                                              DATE:10/22/2022    Treadmill 5 min, 1.0 incline, 2.8 mph, 3.0 for last 2 minutes Bosu ball lunges bilaterally 2 set, 10 reps STS (barely touching down) from mat table with green theraband around knees 3 x 10 reps  Walking exercises with 5# ankle weights (35  feet) Lateral steps x2, pt cued for upright posture Forwards EC (pt cued for deviations left and right of walkway), pt cued to slow down to control lateral deviations. Backwards EO (pt cued for increased step length) [place] to the left (pt requires demonstration and verbal cues for the correct sequence) [place] to the right (one mis step  required CGA) Long blue mat heel to toe walks beside treadmill bar fwds and bwds, 3 reps, pt cued for minimal UE support Long blue mat lateral stepping 3 reps left and right, pt cued for  Long blue mat lateral stepping 3 reps left and right with cone touches with foot furthest from cone Blue foam mat with cone touches on elevated (4 inch step), 20 reps, 3 sets    PATIENT EDUCATION: Education details: Patient educated in HEP (squat into chair barely touching down, and single leg stance). Pt voices verbal  Person educated: Patient Education method: Explanation, Demonstration, Verbal cues, and Handouts Education comprehension: verbalized understanding  HOME EXERCISE PROGRAM: Insert code here with exercises  GOALS: Goals reviewed with patient? No  SHORT TERM GOALS: Target date: 11/29/2022  Pt will be able to walk tandem for 10 consecutive steps (heel to toe) to demonstrate improved dynamic balance for increased community ambulation. Baseline: 4 (seen on FGA) Goal status: INITIAL      LONG TERM GOALS: Target date: 12/27/2022  Pt will improve FGA score to at least 27 for improved balance during dynamic gait activities. Baseline: 23/30 Goal status: INITIAL  2.  Pt will increase SLS of LLE to 30 seconds to address defictis of LLE compared to the RLE for decreased fall risk. Baseline: 5 sec Goal status: INITIAL  3.  Patient will increase FOTO score to 62 to demonstrate predicted increase in functional mobility to complete ADLs  Baseline: 50 Goal status: INITIAL  4.  Pt will complete >11 STS in 30sec in order to achieve age-matched endurance needed for community activity  Baseline: 10 Goal status: INITIAL      ASSESSMENT:  CLINICAL IMPRESSION: PT continued LE strengthening, dynamic balance and static balance training to increase pts balance deficits. Pt able to comply with all exercises today with minimal rest breaks in between exercises. Pt c/o left knee discomfort during  interventions requiring weight bearing activities with knee flexion. Lunge on bosu ball main movement noted with L knee pain. Patient will continue to benefit from skilled physical therapy to improve bilateral LE strength, coordination and balance to decrease fall risk.    OBJECTIVE IMPAIRMENTS: Abnormal gait. BLE endurance, hip ext strength, abnormal gait, abnormal posture, static balance, motor control/planning and dynamic balance.  ACTIVITY LIMITATIONS: sitting, squatting, and stairs  PARTICIPATION LIMITATIONS: cleaning, shopping, community activity, and yard work  PERSONAL FACTORS: Age and Fitness are also affecting patient's functional outcome.   REHAB POTENTIAL: Good  CLINICAL DECISION MAKING: Stable/uncomplicated  EVALUATION COMPLEXITY: Moderate  PLAN:  PT FREQUENCY: 1-2x/week  PT DURATION: 6 weeks  PLANNED INTERVENTIONS: Therapeutic exercises, Therapeutic activity, Neuromuscular re-education, Balance training, Gait training, Patient/Family education, and Self Care  PLAN FOR NEXT SESSION: Step overs with increased SLS, progress hip strengthening  Edison Nasuti A. Laurance Flatten, SPT  Durwin Reges DPT  Durwin Reges, PT 11/01/2022, 1:49 PM

## 2022-11-01 ENCOUNTER — Encounter: Payer: Self-pay | Admitting: Physical Therapy

## 2022-11-01 ENCOUNTER — Ambulatory Visit: Payer: PPO | Admitting: Physical Therapy

## 2022-11-01 DIAGNOSIS — R2689 Other abnormalities of gait and mobility: Secondary | ICD-10-CM

## 2022-11-01 DIAGNOSIS — R269 Unspecified abnormalities of gait and mobility: Secondary | ICD-10-CM

## 2022-11-01 NOTE — Therapy (Unsigned)
OUTPATIENT PHYSICAL THERAPY NEURO TREATMENT   Patient Name: Michelle Cisneros MRN: [id number] DOB:[birth year], 66 y.o., female Today's Date: 11/02/2022   PCP: Maryland Pink, MD  REFERRING PROVIDER: Meade Maw, MD    PT End of Session - 11/01/22 1516     Visit Number 4    Number of Visits 13    Date for PT Re-Evaluation 12/03/22    Authorization - Visit Number 4    Authorization - Number of Visits 10    Progress Note Due on Visit 10    PT Start Time 7209    PT Stop Time 1556    PT Time Calculation (min) 39 min    Equipment Utilized During Treatment Gait belt    Activity Tolerance Patient tolerated treatment well    Behavior During Therapy WFL for tasks assessed/performed                Past Medical History:  Diagnosis Date   Aortic atherosclerosis ([place])    Arthritis    Back pain    Basal cell carcinoma    L forehead, txted in past by another provider   Collagen vascular disease ([place])    Depression    DOE (dyspnea on exertion)    a. 05/2020 Echo: EF 60-65%, no rwma, nl RV size/fxn.   History of stress test    a. 04/2020 Lexiscan MV: No ischemia/infarct.   Hyperlipemia    Hypertension    Melanoma ([place]) ~2015   R forearm txted in past by another provider   Recurrent UTI    Squamous cell carcinoma of skin    L med ankle, txted in past by another provider   Past Surgical History:  Procedure Laterality Date   ABDOMINAL HYSTERECTOMY     COLONOSCOPY WITH PROPOFOL N/A 05/26/2020   Procedure: COLONOSCOPY WITH PROPOFOL;  Surgeon: Toledo, Benay Pike, MD;  Location: [facility] ENDOSCOPY;  Service: Gastroenterology;  Laterality: N/A;   INCONTINENCE SURGERY     OOPHORECTOMY     Patient Active Problem List   Diagnosis Date Noted   Recurrent falls 08/16/2022   Hydrocephalus ([place]) 08/16/2022   Depression 08/16/2022   OSA (obstructive sleep apnea) 04/27/2022   Palpitations 10/03/2021   Dyspnea on exertion 04/19/2020   Aortic atherosclerosis ([place]) 04/19/2020    Essential hypertension 04/19/2020   Hyperlipidemia 04/19/2020   Right carpal tunnel syndrome 11/09/2016    ONSET DATE: August 28th  REFERRING DIAG: Balance  THERAPY DIAG:  Abnormality of gait and mobility  Other abnormalities of gait and mobility  Rationale for Evaluation and Treatment: Rehabilitation  SUBJECTIVE:  SUBJECTIVE STATEMENT: Pt reports no falls since last treatment. Pt reports busy day with grandchildren today. Pt reports compliance with HEP and reports improvements with tandem stance balance exercise and squats into chair.  PERTINENT HISTORY: Fall August 28th 2023, hospital, face and head trauma, severe  hydrocephalus, neurosurgeon, mri born with, brain is handling it well, daughter OT, balance, just 2 falls, not sure if its a progressive disease, pt does not feel she has symptoms, fractured back in 2019. Pt was using AD after back fracture. Has dog that she takes walks on about an hour per day. Watches three grandkids everyday 6, 5, and 3.   PRECAUTIONS: Back was seen early  WEIGHT BEARING RESTRICTIONS: No  FALLS: Has patient fallen in last 6 months? Yes. Number of falls August 28th   LIVING ENVIRONMENT: Lives with: lives with their family and lives with their spouse Lives in: House/apartment Stairs: Yes: Internal: going up easier than coming down steps; one side Has following equipment at home: None  PLOF: Independent  PATIENT GOALS: Pt would like to progress yoga routine, tree pose  OBJECTIVE:   DIAGNOSTIC FINDINGS:  MRI of brain indicates severe hydrocephaly.    COGNITION: Overall cognitive status: Within functional limits for tasks assessed   SENSATION: Not tested  COORDINATION: Some coordination issues noted during FGA, step corrections for catching  balance    MUSCLE LENGTH: Hamstrings: Right  deg; Left  deg Marcello Moores test: Right  deg; Left  deg   POSTURE: rounded shoulders, forward head, decreased lumbar lordosis, increased thoracic kyphosis, and flexed trunk   LOWER EXTREMITY ROM:     Not formally tested evident decreased hip ext AROM bilat seen in lack of terminal stance in standing and gait  LOWER EXTREMITY MMT:   Not formally tested; 5xSTS  STAIRS: Level of Assistance: Complete Independence Stair Negotiation Technique: Step to Pattern Alternating Pattern  with No Rails Single Rail on Left Number of Stairs: 8  Comments: Pt is able to complete stairs with alternating gait pattern without handrails when prompted but reports using handrail and step to strategies when at home and in the community.  GAIT: Gait pattern: WFL Distance walked: 157f Assistive device utilized: None Level of assistance: Complete Independence Comments: Pt self ambulatory, minor compensations, further assess gait next session Observation: forward flex, wide BOS, decreased stride throughout gait (stenosis gait pattern)  FUNCTIONAL TESTS:  30 seconds chair stand test Timed up and go (TUG): 8.9 Functional gait assessment: 23/30 30sec chair stand test 10  PATIENT SURVEYS:  FOTO 50/62  TODAY'S TREATMENT:                                                                                                                              DATE:10/22/2022    Treadmill 5 min, 1.0 incline, 2.8 mph, 3.0 for last 2 minutes Walking exercises with 5# ankle weights (35 feet) Lateral steps x2, pt cued for upright posture Tandem stepping pt cued to touch heel  to toe  Toe walking pt cued for keeping heels off the floor for gastroc/soleus activation Heel walking pt cued for keeping toes off of the floor for anterior tibialis activation Trampoline ball throws SLS with toe touching opposite LE 2 sets of 10 reps Trampoline ball throws on blue foam normal width BOS 2  sets of 10 reps; pt cued to keep opposite LE on floor for independence with balance Trampoline overhead ball throws on blue foam normal width BOS 2 sets of 10 reps; pt cued for core stabilization SLS balance exercise with opposite LE rapid anticipatory movements (hip flexion, extension, and abduction) 6 cycles, 3 sets bilaterally   PATIENT EDUCATION: Education details: Patient educated in HEP (squat into chair barely touching down, and single leg stance). Pt voices verbal  Person educated: Patient Education method: Explanation, Demonstration, Verbal cues, and Handouts Education comprehension: verbalized understanding  HOME EXERCISE PROGRAM: Insert code here with exercises  GOALS: Goals reviewed with patient? No  SHORT TERM GOALS: Target date: 11/30/2022  Pt will be able to walk tandem for 10 consecutive steps (heel to toe) to demonstrate improved dynamic balance for increased community ambulation. Baseline: 4 (seen on FGA) Goal status: INITIAL      LONG TERM GOALS: Target date: 12/28/2022  Pt will improve FGA score to at least 27 for improved balance during dynamic gait activities. Baseline: 23/30 Goal status: INITIAL  2.  Pt will increase SLS of LLE to 30 seconds to address defictis of LLE compared to the RLE for decreased fall risk. Baseline: 5 sec Goal status: INITIAL  3.  Patient will increase FOTO score to 62 to demonstrate predicted increase in functional mobility to complete ADLs  Baseline: 50 Goal status: INITIAL  4.  Pt will complete >11 STS in 30sec in order to achieve age-matched endurance needed for community activity  Baseline: 10 Goal status: INITIAL      ASSESSMENT:  CLINICAL IMPRESSION: PT continued therapeutic exercises focusing on LE strengthening, and static balance with new variations. Pt completed all therex today barefooted because of footwear that was slipping off, will try to remember to bring tennis shoes next session. Pt complied with all  new exercises, unable to complete the SLS with rapid anticipatory movements of opposite LE. Pt also struggled coordinating skipping movement, will possibly try next session. Patient will continue to benefit from skilled physical therapy to improve bilateral LE strength, coordination, and balance to decrease fall risk.     OBJECTIVE IMPAIRMENTS: Abnormal gait. BLE endurance, hip ext strength, abnormal gait, abnormal posture, static balance, motor control/planning and dynamic balance.  ACTIVITY LIMITATIONS: sitting, squatting, and stairs  PARTICIPATION LIMITATIONS: cleaning, shopping, community activity, and yard work  PERSONAL FACTORS: Age and Fitness are also affecting patient's functional outcome.   REHAB POTENTIAL: Good  CLINICAL DECISION MAKING: Stable/uncomplicated  EVALUATION COMPLEXITY: Moderate  PLAN:  PT FREQUENCY: 1-2x/week  PT DURATION: 6 weeks  PLANNED INTERVENTIONS: Therapeutic exercises, Therapeutic activity, Neuromuscular re-education, Balance training, Gait training, Patient/Family education, and Self Care  PLAN FOR NEXT SESSION: Step overs with increased SLS, progress hip strengthening  Edison Nasuti A. Laurance Flatten, SPT  Durwin Reges DPT  Durwin Reges, PT 11/02/2022, 9:16 AM

## 2022-11-03 ENCOUNTER — Ambulatory Visit: Payer: PPO | Admitting: Physical Therapy

## 2022-11-03 DIAGNOSIS — R269 Unspecified abnormalities of gait and mobility: Secondary | ICD-10-CM

## 2022-11-03 DIAGNOSIS — R2689 Other abnormalities of gait and mobility: Secondary | ICD-10-CM

## 2022-11-03 NOTE — Therapy (Signed)
OUTPATIENT PHYSICAL THERAPY NEURO TREATMENT   Patient Name: Michelle Cisneros MRN: [id number] DOB:[birth year], 66 y.o., female Today's Date: 11/04/2022   PCP: Maryland Pink, MD  REFERRING PROVIDER: Meade Maw, MD    PT End of Session - 11/03/22 0831     Visit Number 5    Number of Visits 13    Date for PT Re-Evaluation 12/03/22    Authorization - Visit Number 5    Authorization - Number of Visits 10    Progress Note Due on Visit 10    PT Start Time 0831    PT Stop Time 0910    PT Time Calculation (min) 39 min    Equipment Utilized During Treatment Gait belt    Activity Tolerance Patient tolerated treatment well    Behavior During Therapy WFL for tasks assessed/performed                 Past Medical History:  Diagnosis Date   Aortic atherosclerosis ([place])    Arthritis    Back pain    Basal cell carcinoma    L forehead, txted in past by another provider   Collagen vascular disease ([place])    Depression    DOE (dyspnea on exertion)    a. 05/2020 Echo: EF 60-65%, no rwma, nl RV size/fxn.   History of stress test    a. 04/2020 Lexiscan MV: No ischemia/infarct.   Hyperlipemia    Hypertension    Melanoma ([place]) ~2015   R forearm txted in past by another provider   Recurrent UTI    Squamous cell carcinoma of skin    L med ankle, txted in past by another provider   Past Surgical History:  Procedure Laterality Date   ABDOMINAL HYSTERECTOMY     COLONOSCOPY WITH PROPOFOL N/A 05/26/2020   Procedure: COLONOSCOPY WITH PROPOFOL;  Surgeon: Toledo, Benay Pike, MD;  Location: [facility] ENDOSCOPY;  Service: Gastroenterology;  Laterality: N/A;   INCONTINENCE SURGERY     OOPHORECTOMY     Patient Active Problem List   Diagnosis Date Noted   Recurrent falls 08/16/2022   Hydrocephalus ([place]) 08/16/2022   Depression 08/16/2022   OSA (obstructive sleep apnea) 04/27/2022   Palpitations 10/03/2021   Dyspnea on exertion 04/19/2020   Aortic atherosclerosis ([place]) 04/19/2020    Essential hypertension 04/19/2020   Hyperlipidemia 04/19/2020   Right carpal tunnel syndrome 11/09/2016    ONSET DATE: August 28th  REFERRING DIAG: Balance  THERAPY DIAG:  Abnormality of gait and mobility  Other abnormalities of gait and mobility  Rationale for Evaluation and Treatment: Rehabilitation  SUBJECTIVE:  SUBJECTIVE STATEMENT: Pt reports relaxing morning, not having to help with grandchildren this morning. Pt reports no falls since last treatment. Pt took dog for short walk this morning. Pt is heading to [place] this evening about 7:30. Pt reports not completing HEP this week.  PERTINENT HISTORY: Fall August 28th 2023, hospital, face and head trauma, severe  hydrocephalus, neurosurgeon, mri born with, brain is handling it well, daughter OT, balance, just 2 falls, not sure if its a progressive disease, pt does not feel she has symptoms, fractured back in 2019. Pt was using AD after back fracture. Has dog that she takes walks on about an hour per day. Watches three grandkids everyday 6, 5, and 3.   PRECAUTIONS: Back was seen early  WEIGHT BEARING RESTRICTIONS: No  FALLS: Has patient fallen in last 6 months? Yes. Number of falls August 28th   LIVING ENVIRONMENT: Lives with: lives with their family and lives with their spouse Lives in: House/apartment Stairs: Yes: Internal: going up easier than coming down steps; one side Has following equipment at home: None  PLOF: Independent  PATIENT GOALS: Pt would like to progress yoga routine, tree pose  OBJECTIVE:   DIAGNOSTIC FINDINGS:  MRI of brain indicates severe hydrocephaly.    COGNITION: Overall cognitive status: Within functional limits for tasks assessed   SENSATION: Not tested  COORDINATION: Some coordination issues noted during  FGA, step corrections for catching balance    MUSCLE LENGTH: Hamstrings: Right  deg; Left  deg Marcello Moores test: Right  deg; Left  deg   POSTURE: rounded shoulders, forward head, decreased lumbar lordosis, increased thoracic kyphosis, and flexed trunk   LOWER EXTREMITY ROM:     Not formally tested evident decreased hip ext AROM bilat seen in lack of terminal stance in standing and gait  LOWER EXTREMITY MMT:   Not formally tested; 5xSTS  STAIRS: Level of Assistance: Complete Independence Stair Negotiation Technique: Step to Pattern Alternating Pattern  with No Rails Single Rail on Left Number of Stairs: 8  Comments: Pt is able to complete stairs with alternating gait pattern without handrails when prompted but reports using handrail and step to strategies when at home and in the community.  GAIT: Gait pattern: WFL Distance walked: 158f Assistive device utilized: None Level of assistance: Complete Independence Comments: Pt self ambulatory, minor compensations, further assess gait next session Observation: forward flex, wide BOS, decreased stride throughout gait (stenosis gait pattern)  FUNCTIONAL TESTS:  30 seconds chair stand test Timed up and go (TUG): 8.9 Functional gait assessment: 23/30 30sec chair stand test 10  PATIENT SURVEYS:  FOTO 50/62  TODAY'S TREATMENT:                                                                                                                              DATE:10/22/2022    Treadmill 5 min 30 sec, 1.0 incline, 2.8 mph, 3.0 for last 60 seconds Walking exercises with 5# ankle weights (35 feet) Lateral  steps x2, pt cued for upright posture Carioca x2, pt cued to raise feet up High Knees x2, with SLS holds for 2 seconds Buttkicks x2, with SLS holds for 2 seconds Biodex maze level 2, 1st attempt (static) 1 minute 26 seconds, 2nd attempt (movement level 11), 3rd attempt static 53 seconds, 4th attempt static 41 seconds, 5th attempt static (level  3 maze) 1 minute 39 secs SLS balance exercise with opposite LE rapid anticipatory movements (hip flexion, extension, and abduction) 6 cycles, 2 sets bilaterally   PATIENT EDUCATION: Education details: Patient educated in HEP (squat into chair barely touching down, and single leg stance). Pt voices verbal  Person educated: Patient Education method: Explanation, Demonstration, Verbal cues, and Handouts Education comprehension: verbalized understanding  HOME EXERCISE PROGRAM: Insert code here with exercises  GOALS: Goals reviewed with patient? No  SHORT TERM GOALS: Target date: 12/02/2022  Pt will be able to walk tandem for 10 consecutive steps (heel to toe) to demonstrate improved dynamic balance for increased community ambulation. Baseline: 4 (seen on FGA) Goal status: INITIAL      LONG TERM GOALS: Target date: 12/30/2022  Pt will improve FGA score to at least 27 for improved balance during dynamic gait activities. Baseline: 23/30 Goal status: INITIAL  2.  Pt will increase SLS of LLE to 30 seconds to address defictis of LLE compared to the RLE for decreased fall risk. Baseline: 5 sec Goal status: INITIAL  3.  Patient will increase FOTO score to 62 to demonstrate predicted increase in functional mobility to complete ADLs  Baseline: 50 Goal status: INITIAL  4.  Pt will complete >11 STS in 30sec in order to achieve age-matched endurance needed for community activity  Baseline: 10 Goal status: INITIAL      ASSESSMENT:  CLINICAL IMPRESSION: PT continued therapeutic exercise focusing on LE strengthening, static balance, and dynamic balance. Pt completed therex today with no breaks requested. Pt requires cueing during walking exercises to maintain proper form and holding for the appropriate times. Patient will continue to benefit from skilled physical therapy to improve LE strength and balance to decrease fall risk.    OBJECTIVE IMPAIRMENTS: Abnormal gait. BLE  endurance, hip ext strength, abnormal gait, abnormal posture, static balance, motor control/planning and dynamic balance.  ACTIVITY LIMITATIONS: sitting, squatting, and stairs  PARTICIPATION LIMITATIONS: cleaning, shopping, community activity, and yard work  PERSONAL FACTORS: Age and Fitness are also affecting patient's functional outcome.   REHAB POTENTIAL: Good  CLINICAL DECISION MAKING: Stable/uncomplicated  EVALUATION COMPLEXITY: Moderate  PLAN:  PT FREQUENCY: 1-2x/week  PT DURATION: 6 weeks  PLANNED INTERVENTIONS: Therapeutic exercises, Therapeutic activity, Neuromuscular re-education, Balance training, Gait training, Patient/Family education, and Self Care  PLAN FOR NEXT SESSION: Step overs with increased SLS, progress hip strengthening  Edison Nasuti A. Laurance Flatten, SPT  Durwin Reges DPT  Durwin Reges, PT 11/04/2022, 10:16 AM

## 2022-11-08 ENCOUNTER — Encounter: Payer: Self-pay | Admitting: Physical Therapy

## 2022-11-08 ENCOUNTER — Ambulatory Visit: Payer: PPO | Admitting: Physical Therapy

## 2022-11-08 DIAGNOSIS — R269 Unspecified abnormalities of gait and mobility: Secondary | ICD-10-CM

## 2022-11-08 DIAGNOSIS — R2689 Other abnormalities of gait and mobility: Secondary | ICD-10-CM

## 2022-11-08 NOTE — Therapy (Signed)
OUTPATIENT PHYSICAL THERAPY NEURO TREATMENT   Patient Name: Michelle Cisneros MRN: [id number] DOB:[birth year], 66 y.o., female Today's Date: 11/10/2022   PCP: Maryland Pink, MD  REFERRING PROVIDER: Meade Maw, MD    PT End of Session - 11/10/22 1034     Visit Number 6    Number of Visits 13    Date for PT Re-Evaluation 12/03/22    Authorization - Number of Visits 10    Progress Note Due on Visit 10    PT Start Time 3428    PT Stop Time 1427    PT Time Calculation (min) 42 min    Equipment Utilized During Treatment Gait belt    Activity Tolerance Patient tolerated treatment well    Behavior During Therapy WFL for tasks assessed/performed                  Past Medical History:  Diagnosis Date   Aortic atherosclerosis ([place])    Arthritis    Back pain    Basal cell carcinoma    L forehead, txted in past by another provider   Collagen vascular disease ([place])    Depression    DOE (dyspnea on exertion)    a. 05/2020 Echo: EF 60-65%, no rwma, nl RV size/fxn.   History of stress test    a. 04/2020 Lexiscan MV: No ischemia/infarct.   Hyperlipemia    Hypertension    Melanoma ([place]) ~2015   R forearm txted in past by another provider   Recurrent UTI    Squamous cell carcinoma of skin    L med ankle, txted in past by another provider   Past Surgical History:  Procedure Laterality Date   ABDOMINAL HYSTERECTOMY     COLONOSCOPY WITH PROPOFOL N/A 05/26/2020   Procedure: COLONOSCOPY WITH PROPOFOL;  Surgeon: Toledo, Benay Pike, MD;  Location: [facility] ENDOSCOPY;  Service: Gastroenterology;  Laterality: N/A;   INCONTINENCE SURGERY     OOPHORECTOMY     Patient Active Problem List   Diagnosis Date Noted   Recurrent falls 08/16/2022   Hydrocephalus ([place]) 08/16/2022   Depression 08/16/2022   OSA (obstructive sleep apnea) 04/27/2022   Palpitations 10/03/2021   Dyspnea on exertion 04/19/2020   Aortic atherosclerosis ([place]) 04/19/2020   Essential hypertension 04/19/2020    Hyperlipidemia 04/19/2020   Right carpal tunnel syndrome 11/09/2016    ONSET DATE: August 28th  REFERRING DIAG: Balance  THERAPY DIAG:  Abnormality of gait and mobility  Other abnormalities of gait and mobility  Rationale for Evaluation and Treatment: Rehabilitation  SUBJECTIVE:  SUBJECTIVE STATEMENT:  Pt reports great vacation this past weekend, very active with bike riding and stairs at bed and breakfast. Pt reports skipping the treadmill this morning at the gym in preparation of therapy. Pt reports no falls or lapses in balance during trip.  PERTINENT HISTORY: Fall August 28th 2023, hospital, face and head trauma, severe  hydrocephalus, neurosurgeon, mri born with, brain is handling it well, daughter OT, balance, just 2 falls, not sure if its a progressive disease, pt does not feel she has symptoms, fractured back in 2019. Pt was using AD after back fracture. Has dog that she takes walks on about an hour per day. Watches three grandkids everyday 6, 5, and 3.   PRECAUTIONS: Back was seen early  WEIGHT BEARING RESTRICTIONS: No  FALLS: Has patient fallen in last 6 months? Yes. Number of falls August 28th   LIVING ENVIRONMENT: Lives with: lives with their family and lives with their spouse Lives in: House/apartment Stairs: Yes: Internal: going up easier than coming down steps; one side Has following equipment at home: None  PLOF: Independent  PATIENT GOALS: Pt would like to progress yoga routine, tree pose  OBJECTIVE:   DIAGNOSTIC FINDINGS:  MRI of brain indicates severe hydrocephaly.    COGNITION: Overall cognitive status: Within functional limits for tasks assessed   SENSATION: Not tested  COORDINATION: Some coordination issues noted during FGA, step corrections for catching  balance    MUSCLE LENGTH: Hamstrings: Right  deg; Left  deg Marcello Moores test: Right  deg; Left  deg   POSTURE: rounded shoulders, forward head, decreased lumbar lordosis, increased thoracic kyphosis, and flexed trunk   LOWER EXTREMITY ROM:     Not formally tested evident decreased hip ext AROM bilat seen in lack of terminal stance in standing and gait  LOWER EXTREMITY MMT:   Not formally tested; 5xSTS  STAIRS: Level of Assistance: Complete Independence Stair Negotiation Technique: Step to Pattern Alternating Pattern  with No Rails Single Rail on Left Number of Stairs: 8  Comments: Pt is able to complete stairs with alternating gait pattern without handrails when prompted but reports using handrail and step to strategies when at home and in the community.  GAIT: Gait pattern: WFL Distance walked: 128f Assistive device utilized: None Level of assistance: Complete Independence Comments: Pt self ambulatory, minor compensations, further assess gait next session Observation: forward flex, wide BOS, decreased stride throughout gait (stenosis gait pattern)  FUNCTIONAL TESTS:  30 seconds chair stand test Timed up and go (TUG): 8.9 Functional gait assessment: 23/30 30sec chair stand test 10  PATIENT SURVEYS:  FOTO 50/62  TODAY'S TREATMENT:                                                                                                                              DATE:10/22/2022    Treadmill 5 min 30 sec, 1.0 percent incline, 2.9 mph, 3.1 for last 60 seconds Biodex; pt cued for foot position and upright  posture Fall risk assessment score 4.9 UE support on left side Limits of stabiliy testing, surface static, no UE support, level 1 Trials (101 seconds, 54 seconds) - static surface, no UE support, level 3, 1 trial 96 Maze control training, static surface, no UE support, Trials (47 seconds, 58 seconds) Walking exercises with 7.5# ankle weights (35 feet) Lateral steps x2, pt cued for  upright posture Walking lunges x2, pt cued to bend knees High Knees x2, with SLS holds for 2 seconds STS with trampoline overhead throws with ball pickup from floor, 2 sets of 15 reps; pt cued for proper body mechanics of picking up object from floor.   PATIENT EDUCATION: Education details: Patient educated in HEP (squat into chair barely touching down, and single leg stance). Pt voices verbal  Person educated: Patient Education method: Explanation, Demonstration, Verbal cues, and Handouts Education comprehension: verbalized understanding  HOME EXERCISE PROGRAM: Insert code here with exercises  GOALS: Goals reviewed with patient? No  SHORT TERM GOALS: Target date: 12/08/2022  Pt will be able to walk tandem for 10 consecutive steps (heel to toe) to demonstrate improved dynamic balance for increased community ambulation. Baseline: 4 (seen on FGA) Goal status: INITIAL      LONG TERM GOALS: Target date: 01/05/2023  Pt will improve FGA score to at least 27 for improved balance during dynamic gait activities. Baseline: 23/30 Goal status: INITIAL  2.  Pt will increase SLS of LLE to 30 seconds to address defictis of LLE compared to the RLE for decreased fall risk. Baseline: 5 sec Goal status: INITIAL  3.  Patient will increase FOTO score to 62 to demonstrate predicted increase in functional mobility to complete ADLs  Baseline: 50 Goal status: INITIAL  4.  Pt will complete >11 STS in 30sec in order to achieve age-matched endurance needed for community activity  Baseline: 10 Goal status: INITIAL      ASSESSMENT:  CLINICAL IMPRESSION: Pt continued to demonstrate improved balance during therex today. PT continued therex including strengthening and balance training on biodex. Pt requires verbal cueing for sequencing of exercises involving multiple steps. Patient will continue to benefit from skilled physical therapy to improve strength and balance during both static and  dynamic movements for increased community ambulation and decreased fall risk.    OBJECTIVE IMPAIRMENTS: Abnormal gait. BLE endurance, hip ext strength, abnormal gait, abnormal posture, static balance, motor control/planning and dynamic balance.  ACTIVITY LIMITATIONS: sitting, squatting, and stairs  PARTICIPATION LIMITATIONS: cleaning, shopping, community activity, and yard work  PERSONAL FACTORS: Age and Fitness are also affecting patient's functional outcome.   REHAB POTENTIAL: Good  CLINICAL DECISION MAKING: Stable/uncomplicated  EVALUATION COMPLEXITY: Moderate  PLAN:  PT FREQUENCY: 1-2x/week  PT DURATION: 6 weeks  PLANNED INTERVENTIONS: Therapeutic exercises, Therapeutic activity, Neuromuscular re-education, Balance training, Gait training, Patient/Family education, and Self Care  PLAN FOR NEXT SESSION: Step overs with increased SLS, progress hip strengthening  Edison Nasuti A. Laurance Flatten, SPT  Durwin Reges DPT  Durwin Reges, PT 11/10/2022, 10:35 AM

## 2022-11-10 ENCOUNTER — Ambulatory Visit: Payer: PPO | Admitting: Physical Therapy

## 2022-11-10 ENCOUNTER — Encounter: Payer: Self-pay | Admitting: Physical Therapy

## 2022-11-10 DIAGNOSIS — R2689 Other abnormalities of gait and mobility: Secondary | ICD-10-CM | POA: Diagnosis not present

## 2022-11-10 DIAGNOSIS — R269 Unspecified abnormalities of gait and mobility: Secondary | ICD-10-CM

## 2022-11-10 NOTE — Therapy (Signed)
OUTPATIENT PHYSICAL THERAPY NEURO TREATMENT/DISCHARGE   Patient Name: Michelle Cisneros MRN: [id number] DOB:06-Nov-1956, 66 y.o., female Today's Date: 11/10/2022   PCP: Maryland Pink, MD  REFERRING PROVIDER: Meade Maw, MD    PT End of Session - 11/10/22 [phone]     Visit Number 7    Number of Visits 13    Date for PT Re-Evaluation 12/03/22    Authorization - Visit Number 7    Authorization - Number of Visits 10    Progress Note Due on Visit 10    PT Start Time 0921    PT Stop Time 1001    PT Time Calculation (min) 40 min    Equipment Utilized During Treatment Gait belt    Activity Tolerance Patient tolerated treatment well    Behavior During Therapy [facility] for tasks assessed/performed                 Past Medical History:  Diagnosis Date   Aortic atherosclerosis ([place])    Arthritis    Back pain    Basal cell carcinoma    L forehead, txted in past by another provider   Collagen vascular disease ([place])    Depression    DOE (dyspnea on exertion)    a. 05/2020 Echo: EF 60-65%, no rwma, nl RV size/fxn.   History of stress test    a. 04/2020 Lexiscan MV: No ischemia/infarct.   Hyperlipemia    Hypertension    Melanoma ([place]) ~2015   R forearm txted in past by another provider   Recurrent UTI    Squamous cell carcinoma of skin    L med ankle, txted in past by another provider   Past Surgical History:  Procedure Laterality Date   ABDOMINAL HYSTERECTOMY     COLONOSCOPY WITH PROPOFOL N/A 05/26/2020   Procedure: COLONOSCOPY WITH PROPOFOL;  Surgeon: Toledo, Benay Pike, MD;  Location: [facility] ENDOSCOPY;  Service: Gastroenterology;  Laterality: N/A;   INCONTINENCE SURGERY     OOPHORECTOMY     Patient Active Problem List   Diagnosis Date Noted   Recurrent falls 08/16/2022   Hydrocephalus ([place]) 08/16/2022   Depression 08/16/2022   OSA (obstructive sleep apnea) 04/27/2022   Palpitations 10/03/2021   Dyspnea on exertion 04/19/2020   Aortic atherosclerosis ([place])  04/19/2020   Essential hypertension 04/19/2020   Hyperlipidemia 04/19/2020   Right carpal tunnel syndrome 11/09/2016    ONSET DATE: August 28th  REFERRING DIAG: Balance  THERAPY DIAG:  Abnormality of gait and mobility  Other abnormalities of gait and mobility  Rationale for Evaluation and Treatment: Rehabilitation  SUBJECTIVE:  SUBJECTIVE STATEMENT:  Pt reports still recovering from trip last week. Pt agrees with POC continuing HEP independently. Pt reports no lapses in balance or falls. Pt reports wearing heavier sneakers today to see if will help with balance activities.  PERTINENT HISTORY: Fall August 28th 2023, hospital, face and head trauma, severe  hydrocephalus, neurosurgeon, mri born with, brain is handling it well, daughter OT, balance, just 2 falls, not sure if its a progressive disease, pt does not feel she has symptoms, fractured back in 2019. Pt was using AD after back fracture. Has dog that she takes walks on about an hour per day. Watches three grandkids everyday 6, 5, and 3.   PRECAUTIONS: Back was seen early  WEIGHT BEARING RESTRICTIONS: No  FALLS: Has patient fallen in last 6 months? Yes. Number of falls August 28th   LIVING ENVIRONMENT: Lives with: lives with their family and lives with their spouse Lives in: House/apartment Stairs: Yes: Internal: going up easier than coming down steps; one side Has following equipment at home: None  PLOF: Independent  PATIENT GOALS: Pt would like to progress yoga routine, tree pose  OBJECTIVE:   DIAGNOSTIC FINDINGS:  MRI of brain indicates severe hydrocephaly.    COGNITION: Overall cognitive status: Within functional limits for tasks assessed   SENSATION: Not tested  COORDINATION: Some coordination issues noted during FGA,  step corrections for catching balance    MUSCLE LENGTH: Hamstrings: Right  deg; Left  deg Marcello Moores test: Right  deg; Left  deg   POSTURE: rounded shoulders, forward head, decreased lumbar lordosis, increased thoracic kyphosis, and flexed trunk   LOWER EXTREMITY ROM:     Not formally tested evident decreased hip ext AROM bilat seen in lack of terminal stance in standing and gait  LOWER EXTREMITY MMT:   Not formally tested; 5xSTS  STAIRS: Level of Assistance: Complete Independence Stair Negotiation Technique: Step to Pattern Alternating Pattern  with No Rails Single Rail on Left Number of Stairs: 8  Comments: Pt is able to complete stairs with alternating gait pattern without handrails when prompted but reports using handrail and step to strategies when at home and in the community.  GAIT: Gait pattern: WFL Distance walked: 143f Assistive device utilized: None Level of assistance: Complete Independence Comments: Pt self ambulatory, minor compensations, further assess gait next session Observation: forward flex, wide BOS, decreased stride throughout gait (stenosis gait pattern)  FUNCTIONAL TESTS:  30 seconds chair stand test Timed up and go (TUG): 8.9 Functional gait assessment: 23/30 30sec chair stand test 10  PATIENT SURVEYS:  FOTO 50/62  TODAY'S TREATMENT:                                                                                                                              DATE:10/22/2022    Treadmill 5 min 30 sec, 1.0 percent incline, 2.9 mph, 3.1 for last 60 seconds SLS balance exercise bilaterally 3 trials per side; pt cued for no UE  support Gait analysis FGA 220 feet; pt requires cueing and demonstration for correct perfomance 40 foot monster walks, prescribed for HEP 12 Sit to stands, from green chair; pt cued to ensure back makes contact with back of chair each rep  PATIENT EDUCATION: Education details: Patient educated in HEP (squat into chair barely  touching down, and single leg stance). Pt voices verbal  Person educated: Patient Education method: Explanation, Demonstration, Verbal cues, and Handouts Education comprehension: verbalized understanding  HOME EXERCISE PROGRAM: Insert code here with exercises  GOALS: Goals reviewed with patient? No  SHORT TERM GOALS: Target date: 12/08/2022  Pt will be able to walk tandem for 10 consecutive steps (heel to toe) to demonstrate improved dynamic balance for increased community ambulation. Baseline: 4 (seen on FGA); 11/10/2022: 9 Goal status: MET      LONG TERM GOALS: Target date: 01/05/2023  Pt will improve FGA score to at least 27 for improved balance during dynamic gait activities. Baseline: 23/30; 11/10/2022: 27/30 Goal status: MET  2.  Pt will increase SLS of LLE to 30 seconds to address defictis of LLE compared to the RLE for decreased fall risk. Baseline: 5 sec 11/10/2022: 13.64 seconds Goal status: NOT MET  3.  Patient will increase FOTO score to 62 to demonstrate predicted increase in functional mobility to complete ADLs  Baseline: 50 11/10/2022: 64 Goal status: MET  4.  Pt will complete >11 STS in 30sec in order to achieve age-matched endurance needed for community activity  Baseline: 10 11/10/2022:12 reps Goal status: MET      ASSESSMENT:  CLINICAL IMPRESSION:  PT reassessed goal progression and continued therex to increase independence with balance. Patient met 4/5 goals made upon initial evaluation. Pt was only one step short of meeting the remaining tandem gait goal; 9 sequential steps with no steps for balance corrections, goal was 10 reps. Pts strength of bilateral LEs and balance have showed improvements since therapy and pt is ready to progress independent HEP. Patient will continue to benefit from therex included in a HEP for independence in managing balance improvements for decreased fall risk and improved community ambulation.   OBJECTIVE IMPAIRMENTS:  Abnormal gait. BLE endurance, hip ext strength, abnormal gait, abnormal posture, static balance, motor control/planning and dynamic balance.  ACTIVITY LIMITATIONS: sitting, squatting, and stairs  PARTICIPATION LIMITATIONS: cleaning, shopping, community activity, and yard work  PERSONAL FACTORS: Age and Fitness are also affecting patient's functional outcome.   REHAB POTENTIAL: Good  CLINICAL DECISION MAKING: Stable/uncomplicated  EVALUATION COMPLEXITY: Moderate  PLAN:  PT FREQUENCY: 1-2x/week  PT DURATION: 6 weeks  PLANNED INTERVENTIONS: Therapeutic exercises, Therapeutic activity, Neuromuscular re-education, Balance training, Gait training, Patient/Family education, and Self Care  PLAN FOR NEXT SESSION: Step overs with increased SLS, progress hip strengthening  Edison Nasuti A. Laurance Flatten, SPT  Durwin Reges DPT  Durwin Reges, PT 11/10/2022, 2:02 PM

## 2022-11-17 ENCOUNTER — Encounter: Payer: PPO | Admitting: Physical Therapy

## 2022-11-19 ENCOUNTER — Encounter: Payer: PPO | Admitting: Physical Therapy

## 2022-11-24 ENCOUNTER — Encounter: Payer: PPO | Admitting: Physical Therapy

## 2022-11-26 ENCOUNTER — Encounter: Payer: PPO | Admitting: Physical Therapy

## 2022-11-29 DIAGNOSIS — E042 Nontoxic multinodular goiter: Secondary | ICD-10-CM | POA: Diagnosis not present

## 2022-12-01 ENCOUNTER — Encounter: Payer: PPO | Admitting: Physical Therapy

## 2022-12-03 ENCOUNTER — Encounter: Payer: PPO | Admitting: Physical Therapy

## 2022-12-08 ENCOUNTER — Encounter: Payer: PPO | Admitting: Physical Therapy

## 2022-12-09 DIAGNOSIS — E042 Nontoxic multinodular goiter: Secondary | ICD-10-CM | POA: Diagnosis not present

## 2022-12-10 ENCOUNTER — Encounter: Payer: PPO | Admitting: Physical Therapy

## 2022-12-15 ENCOUNTER — Encounter: Payer: PPO | Admitting: Physical Therapy

## 2022-12-17 ENCOUNTER — Encounter: Payer: PPO | Admitting: Physical Therapy

## 2022-12-23 ENCOUNTER — Encounter: Payer: PPO | Admitting: Physical Therapy

## 2022-12-27 ENCOUNTER — Encounter: Payer: PPO | Admitting: Physical Therapy

## 2023-01-28 ENCOUNTER — Telehealth: Payer: Self-pay

## 2023-01-28 NOTE — Telephone Encounter (Signed)
Referral Notes Number of Notes: 5 . Type Date User Summary Attachment  General 01/28/2023 10:32 AM Rae Lips, CMA Auto: Referral message -  Note: ----- Message ----- From: Rae Lips, CMA Sent: 01/28/2023  10:32 AM EST To: Meade Maw, MD   FYI- patient doesn't want to get her repeat scan that is due in March 2024 for Obstructive hydrocephalus and imbalance.   ----- Message ----- From: Nicholaus Corolla Sent: 01/28/2023  10:13 AM EST To: April H Pait; Rae Lips, [place] patient and she said she wants to cancel on that . Does not want MRI done.  ----- Message ----- From: Augustine Radar Sent: 01/26/2023   8:57 AM EST To: Nicholaus Corolla     ----- Message ----- From: Rae Lips, CMA Sent: 01/26/2023   8:47 AM EST To: April H Pait   Please get the patient scheduled for her repeat Brain MRI that is due in March 2024.

## 2023-02-01 ENCOUNTER — Other Ambulatory Visit: Payer: Self-pay | Admitting: *Deleted

## 2023-02-01 MED ORDER — LISINOPRIL 20 MG PO TABS: 20.0000 mg | ORAL_TABLET | Freq: Every day | ORAL | 0 refills | Status: DC

## 2023-02-11 DIAGNOSIS — R300 Dysuria: Secondary | ICD-10-CM | POA: Diagnosis not present

## 2023-02-11 DIAGNOSIS — N390 Urinary tract infection, site not specified: Secondary | ICD-10-CM | POA: Diagnosis not present

## 2023-03-02 DIAGNOSIS — G919 Hydrocephalus, unspecified: Secondary | ICD-10-CM | POA: Diagnosis not present

## 2023-03-02 DIAGNOSIS — W19XXXA Unspecified fall, initial encounter: Secondary | ICD-10-CM | POA: Diagnosis not present

## 2023-03-10 DIAGNOSIS — I10 Essential (primary) hypertension: Secondary | ICD-10-CM | POA: Diagnosis not present

## 2023-03-10 DIAGNOSIS — Z0000 Encounter for general adult medical examination without abnormal findings: Secondary | ICD-10-CM | POA: Diagnosis not present

## 2023-03-17 ENCOUNTER — Other Ambulatory Visit: Payer: Self-pay | Admitting: Family Medicine

## 2023-03-17 DIAGNOSIS — I10 Essential (primary) hypertension: Secondary | ICD-10-CM | POA: Diagnosis not present

## 2023-03-17 DIAGNOSIS — F32A Depression, unspecified: Secondary | ICD-10-CM | POA: Diagnosis not present

## 2023-03-17 DIAGNOSIS — E785 Hyperlipidemia, unspecified: Secondary | ICD-10-CM | POA: Diagnosis not present

## 2023-03-17 DIAGNOSIS — I700 Atherosclerosis of aorta: Secondary | ICD-10-CM | POA: Diagnosis not present

## 2023-03-17 DIAGNOSIS — Z87440 Personal history of urinary (tract) infections: Secondary | ICD-10-CM | POA: Diagnosis not present

## 2023-03-17 DIAGNOSIS — Z1331 Encounter for screening for depression: Secondary | ICD-10-CM | POA: Diagnosis not present

## 2023-03-17 DIAGNOSIS — Z0000 Encounter for general adult medical examination without abnormal findings: Secondary | ICD-10-CM | POA: Diagnosis not present

## 2023-03-17 DIAGNOSIS — G919 Hydrocephalus, unspecified: Secondary | ICD-10-CM | POA: Diagnosis not present

## 2023-03-17 DIAGNOSIS — Z1231 Encounter for screening mammogram for malignant neoplasm of breast: Secondary | ICD-10-CM

## 2023-03-21 ENCOUNTER — Encounter: Payer: Self-pay | Admitting: Urology

## 2023-03-21 ENCOUNTER — Ambulatory Visit: Payer: PPO | Admitting: Urology

## 2023-03-21 DIAGNOSIS — N3946 Mixed incontinence: Secondary | ICD-10-CM | POA: Diagnosis not present

## 2023-03-21 DIAGNOSIS — N3020 Other chronic cystitis without hematuria: Secondary | ICD-10-CM | POA: Diagnosis not present

## 2023-03-21 LAB — URINALYSIS, COMPLETE
Bilirubin, UA: NEGATIVE
Glucose, UA: NEGATIVE
Ketones, UA: NEGATIVE
Nitrite, UA: NEGATIVE
Protein,UA: NEGATIVE
Specific Gravity, UA: 1.015 (ref 1.005–1.030)
Urobilinogen, Ur: 0.2 mg/dL (ref 0.2–1.0)
pH, UA: 5.5 (ref 5.0–7.5)

## 2023-03-21 LAB — MICROSCOPIC EXAMINATION: Bacteria, UA: NONE SEEN

## 2023-03-21 MED ORDER — TRIMETHOPRIM 100 MG PO TABS: 100.0000 mg | ORAL_TABLET | Freq: Every day | ORAL | 3 refills | Status: DC

## 2023-03-21 MED ORDER — NITROFURANTOIN MONOHYD MACRO 100 MG PO CAPS: 100.0000 mg | ORAL_CAPSULE | Freq: Every day | ORAL | 3 refills | Status: DC

## 2023-03-21 NOTE — Progress Notes (Addendum)
03/21/2023 9:05 AM   Michelle Cisneros 09/08/1956 [id number]  Referring provider: Maryland Pink, MD [address] [place],  [place] [address]  Chief Complaint  Patient presents with   Follow-up    Chronic cystitis   Urinary Incontinence    HPI: Reviewed notes.  Frequency stable.  Infection free on trimethoprim.  Her prescription ran out she had another infection in February.  Clinically not infected today  On chart review it appears she was getting breakthrough infections on trimethoprim and I switch her to daily Macrobid   PMH: Past Medical History:  Diagnosis Date   Aortic atherosclerosis    Arthritis    Back pain    Basal cell carcinoma    L forehead, txted in past by another provider   Collagen vascular disease    Depression    DOE (dyspnea on exertion)    a. 05/2020 Echo: EF 60-65%, no rwma, nl RV size/fxn.   History of stress test    a. 04/2020 Lexiscan MV: No ischemia/infarct.   Hyperlipemia    Hypertension    Melanoma ~2015   R forearm txted in past by another provider   Recurrent UTI    Squamous cell carcinoma of skin    L med ankle, txted in past by another provider    Surgical History: Past Surgical History:  Procedure Laterality Date   ABDOMINAL HYSTERECTOMY     COLONOSCOPY WITH PROPOFOL N/A 05/26/2020   Procedure: COLONOSCOPY WITH PROPOFOL;  Surgeon: Toledo, Benay Pike, MD;  Location: [facility] ENDOSCOPY;  Service: Gastroenterology;  Laterality: N/A;   INCONTINENCE SURGERY     OOPHORECTOMY      Home Medications:  Allergies as of 03/21/2023   No Known Allergies      Medication List        Accurate as of March 21, 2023  9:05 AM. If you have any questions, ask your nurse or doctor.          aspirin EC 81 MG tablet Take 1 tablet (81 mg total) by mouth daily.   atorvastatin 10 MG tablet Commonly known as: LIPITOR TAKE ONE TABLET BY MOUTH DAILY   buPROPion 300 MG 24 hr tablet Commonly known as: WELLBUTRIN XL TAKE ONE  TABLET BY MOUTH DAILY   Calcium Carbonate-Vitamin D 600-400 MG-UNIT tablet Take 1 tablet by mouth daily.   fluticasone 50 MCG/ACT nasal spray Commonly known as: FLONASE Place 2 sprays into both nostrils daily.   lisinopril 20 MG tablet Commonly known as: ZESTRIL Take 1 tablet (20 mg total) by mouth daily.   metroNIDAZOLE 0.75 % cream Commonly known as: METROCREAM Apply to face once to twice daily for rosacea.   nitrofurantoin (macrocrystal-monohydrate) 100 MG capsule Commonly known as: MACROBID Take 1 capsule (100 mg total) by mouth daily.        Allergies: No Known Allergies  Family History: Family History  Problem Relation Age of Onset   Cancer Father        laryngeal   Atrial fibrillation Father    Stroke Mother    Breast cancer Neg Hx    Bladder Cancer Neg Hx    Kidney cancer Neg Hx     Social History:  reports that she quit smoking about 35 years ago. Her smoking use included cigarettes. She has a 30.00 pack-year smoking history. She has been exposed to tobacco smoke. She has never used smokeless tobacco. She reports current alcohol use. She reports that she does not currently use drugs.  ROS:                                        Physical Exam: There were no vitals taken for this visit.  Constitutional:  Alert and oriented, No acute distress. HEENT: [place] AT, moist mucus membranes.  Trachea midline, no masses.   Laboratory Data: Lab Results  Component Value Date   WBC 6.9 08/16/2022   HGB 13.3 08/16/2022   HCT 40.3 08/16/2022   MCV 91.2 08/16/2022   PLT 232 08/16/2022    Lab Results  Component Value Date   CREATININE 0.57 08/16/2022    No results found for: "PSA"  No results found for: "TESTOSTERONE"  No results found for: "HGBA1C"  Urinalysis    Component Value Date/Time   COLORURINE STRAW (A) 08/16/2022 0317   APPEARANCEUR CLEAR (A) 08/16/2022 0317   APPEARANCEUR Hazy (A) 08/10/2019 0914   LABSPEC 1.011  08/16/2022 0317   PHURINE 5.0 08/16/2022 0317   GLUCOSEU NEGATIVE 08/16/2022 0317   HGBUR SMALL (A) 08/16/2022 0317   BILIRUBINUR NEGATIVE 08/16/2022 0317   BILIRUBINUR Negative 08/10/2019 0914   KETONESUR NEGATIVE 08/16/2022 0317   PROTEINUR NEGATIVE 08/16/2022 0317   NITRITE NEGATIVE 08/16/2022 0317   LEUKOCYTESUR SMALL (A) 08/16/2022 0317    Pertinent Imaging:   Assessment & Plan: Macrodantin 100 mg daily 90 x 3 sent to pharmacy and I will see her in 1 year  1. Mixed incontinence  - Urinalysis, Complete   No follow-ups on file.  Reece Packer, MD  [place] [address] [place], [place] [address] [phone]

## 2023-03-21 NOTE — Addendum Note (Signed)
Addended by: Despina Hidden on: 03/21/2023 09:20 AM   Modules accepted: Orders

## 2023-03-21 NOTE — Addendum Note (Signed)
Addended by: Despina Hidden on: 03/21/2023 09:14 AM   Modules accepted: Orders

## 2023-04-25 ENCOUNTER — Other Ambulatory Visit: Payer: Self-pay

## 2023-04-25 MED ORDER — LISINOPRIL 20 MG PO TABS: 20.0000 mg | ORAL_TABLET | Freq: Every day | ORAL | 0 refills | Status: DC

## 2023-04-26 ENCOUNTER — Encounter: Payer: PPO | Admitting: Dermatology

## 2023-05-02 ENCOUNTER — Ambulatory Visit
Admission: RE | Admit: 2023-05-02 | Discharge: 2023-05-02 | Disposition: A | Discharge status: Home / Self Care | Payer: PPO | Source: Ambulatory Visit | Attending: Family Medicine | Admitting: Family Medicine

## 2023-05-02 DIAGNOSIS — Z1231 Encounter for screening mammogram for malignant neoplasm of breast: Secondary | ICD-10-CM | POA: Diagnosis not present

## 2023-05-24 ENCOUNTER — Encounter: Payer: Self-pay | Admitting: Neurosurgery

## 2023-05-26 ENCOUNTER — Encounter: Payer: Self-pay | Admitting: Internal Medicine

## 2023-05-26 ENCOUNTER — Ambulatory Visit: Payer: PPO | Attending: Internal Medicine | Admitting: Internal Medicine

## 2023-05-26 VITALS — BP 120/70 | HR 62 | Ht 66.0 in | Wt 162.1 lb

## 2023-05-26 DIAGNOSIS — I10 Essential (primary) hypertension: Secondary | ICD-10-CM | POA: Diagnosis not present

## 2023-05-26 DIAGNOSIS — E782 Mixed hyperlipidemia: Secondary | ICD-10-CM | POA: Diagnosis not present

## 2023-05-26 DIAGNOSIS — I471 Supraventricular tachycardia, unspecified: Secondary | ICD-10-CM | POA: Diagnosis not present

## 2023-05-26 DIAGNOSIS — I700 Atherosclerosis of aorta: Secondary | ICD-10-CM | POA: Diagnosis not present

## 2023-05-26 NOTE — Progress Notes (Signed)
Follow-up Outpatient Visit Date: 05/26/2023  Primary Care Provider: Jerl Mina, MD [address] [place] [address]  Chief Complaint: Follow-up of palpitations  HPI:  Michelle Cisneros is a 67 y.o. female with history of PSVT, aortic atherosclerosis, HTN, HLD, chronic back pain, thyroid nodule, and skin cancer, who presents for follow-up of palpitations and fatigue.  I last saw her a year ago, at which time she was feeling well.  Home blood pressures were well-controlled.  She reported a single episode of possible atrial fibrillation recorded by her KardiaMobile device (tracing was not available for review).  We did not make any medication changes or pursue additional testing.  Today, [name] reports that she has been feeling well though she has been under some stress.  Her younger brother had a mechanical fall around Christmas and hit his head.  He subsequently passed away, presumably due to intracranial hemorrhage after having just been started on anticoagulation for atrial fibrillation.  Her sister was also recently diagnosed with atrial fibrillation.  She herself has not had any palpitations nor chest pain, shortness of breath, lightheadedness, or edema.  She notes some difficulty falling asleep if she gets up to use the bathroom overnight.  She is not checking her blood pressure regularly at home.  She has put on a little bit of weight but is hoping to restart Weight [employer] to help get back to her target weight.  --------------------------------------------------------------------------------------------------  Cardiovascular History & Procedures: Cardiovascular Problems: Atherosclerosis PSVT   Risk Factors: Atherosclerosis, hypertension, age greater than 57, and prior tobacco use   Cath/PCI: None   CV Surgery: None   EP Procedures and Devices: 14-day event monitor (10/02/2021): Predominantly sinus rhythm with multiple runs of PSVT (lasting up to 33  seconds) as well as occasional PACs and rare PVCs.   Non-Invasive Evaluation(s): TTE (6/3/20201): Technically difficult study.  LVEF 60-65% with normal diastolic function.  Normal RV size and function.  No significant valvular abnormality. Pharmacologic MPI (05/05/2020): Normal study without ischemia or scar.  LVEF 55-65%.  Recent CV Pertinent Labs: Lab Results  Component Value Date   K 4.2 08/16/2022   BUN 24 (H) 08/16/2022   CREATININE 0.57 08/16/2022    Past medical and surgical history were reviewed and updated in EPIC.  Current Meds  Medication Sig   aspirin EC 81 MG tablet Take 1 tablet (81 mg total) by mouth daily.   atorvastatin (LIPITOR) 10 MG tablet TAKE ONE TABLET BY MOUTH DAILY   buPROPion (WELLBUTRIN XL) 300 MG 24 hr tablet TAKE ONE TABLET BY MOUTH DAILY   Calcium Carbonate-Vitamin D 600-400 MG-UNIT tablet Take 1 tablet by mouth daily.   fluticasone (FLONASE) 50 MCG/ACT nasal spray Place 2 sprays into both nostrils daily.   lisinopril (ZESTRIL) 20 MG tablet Take 1 tablet (20 mg total) by mouth daily.   metroNIDAZOLE (METROCREAM) 0.75 % cream Apply to face once to twice daily for rosacea.   nitrofurantoin, macrocrystal-monohydrate, (MACROBID) 100 MG capsule Take 1 capsule (100 mg total) by mouth daily.    Allergies: Patient has no known allergies.  Social History   Tobacco Use   Smoking status: Former    Packs/day: 1.00    Years: 30.00    Additional pack years: 0.00    Total pack years: 30.00    Types: Cigarettes    Quit date: 10/04/1987    Years since quitting: 35.6    Passive exposure: Past   Smokeless tobacco: Never  Vaping Use  Vaping Use: Never used  Substance Use Topics   Alcohol use: Yes    Comment: occassional; once glass of wine   Drug use: Not Currently    Family History  Problem Relation Age of Onset   Cancer Father        laryngeal   Atrial fibrillation Father    Stroke Mother    Breast cancer Neg Hx    Bladder Cancer Neg Hx    Kidney  cancer Neg Hx     Review of Systems: A 12-system review of systems was performed and was negative except as noted in the HPI.  --------------------------------------------------------------------------------------------------  Physical Exam: BP 120/70 (BP Location: Left Arm, Patient Position: Sitting, Cuff Size: Normal)   Pulse 62   Ht 5\' 6"  (1.676 m)   Wt 162 lb 2 oz (73.5 kg)   SpO2 95%   BMI 26.17 kg/m   General:  NAD. Neck: No JVD or HJR. Lungs: Clear to auscultation bilaterally without wheezes or crackles. Heart: Regular rate and rhythm without murmurs, rubs, or gallops. Abdomen: Soft, nontender, nondistended. Extremities: No lower extremity edema.  EKG: Normal sinus rhythm without abnormalities.  Lab Results  Component Value Date   WBC 6.9 08/16/2022   HGB 13.3 08/16/2022   HCT 40.3 08/16/2022   MCV 91.2 08/16/2022   PLT 232 08/16/2022    Lab Results  Component Value Date   NA 139 08/16/2022   K 4.2 08/16/2022   CL 107 08/16/2022   CO2 24 08/16/2022   BUN 24 (H) 08/16/2022   CREATININE 0.57 08/16/2022   GLUCOSE 105 (H) 08/16/2022    --------------------------------------------------------------------------------------------------  ASSESSMENT AND PLAN: PSVT: Ms. Jefm Bryant has not had any further palpitations.  We will defer additional testing and medication changes at this time.  Hypertension: Blood pressure well-controlled today.  Continue lisinopril 20 mg daily.  Aortic atherosclerosis and hyperlipidemia: Continue atorvastatin 10 mg daily.  LDL reasonable at 81 on last check in 02/2023 through Dr. Webb [place]mith office.  Follow-up: Return to clinic in 1 year.  Yvonne Kendall, MD 05/26/2023 10:09 AM

## 2023-05-26 NOTE — Patient Instructions (Signed)
Medication Instructions:  Your Physician recommend you continue on your current medication as directed.    *If you need a refill on your cardiac medications before your next appointment, please call your pharmacy*   Lab Work: None ordered today   Testing/Procedures: None ordered today   Follow-Up: At [facility], you and your health needs are our priority.  As part of our continuing mission to provide you with exceptional heart care, we have created designated Provider Care Teams.  These Care Teams include your primary Cardiologist (physician) and Advanced Practice Providers (APPs -  Physician Assistants and Nurse Practitioners) who all work together to provide you with the care you need, when you need it.  We recommend signing up for the patient portal called "MyChart".  Sign up information is provided on this After Visit Summary.  MyChart is used to connect with patients for Virtual Visits (Telemedicine).  Patients are able to view lab/test results, encounter notes, upcoming appointments, etc.  Non-urgent messages can be sent to your provider as well.   To learn more about what you can do with MyChart, go to [email or web address].    Your next appointment:   1 year(s)  Provider:   You may see Christopher End, MD or one of the following Advanced Practice Providers on your designated Care Team:   Christopher Berge, NP Ryan Dunn, PA-C Cadence Furth, PA-C Sheri Hammock, NP    

## 2023-05-27 ENCOUNTER — Encounter: Payer: Self-pay | Admitting: Internal Medicine

## 2023-05-27 DIAGNOSIS — I471 Supraventricular tachycardia, unspecified: Secondary | ICD-10-CM | POA: Insufficient documentation

## 2023-05-28 ENCOUNTER — Encounter: Payer: Self-pay | Admitting: Internal Medicine

## 2023-05-28 DIAGNOSIS — G4733 Obstructive sleep apnea (adult) (pediatric): Secondary | ICD-10-CM

## 2023-05-28 DIAGNOSIS — G4700 Insomnia, unspecified: Secondary | ICD-10-CM

## 2023-05-28 DIAGNOSIS — I10 Essential (primary) hypertension: Secondary | ICD-10-CM

## 2023-05-30 NOTE — Telephone Encounter (Signed)
Please let Ms. Mabus know that I would favor repeating sleep study if that was previously recommended.  However, I will defer to Dr. Mayford Knife (I will forward this to Dr. Mayford Knife for her input).  Yvonne Kendall, MD [facility]

## 2023-05-31 ENCOUNTER — Telehealth: Payer: Self-pay | Admitting: *Deleted

## 2023-05-31 NOTE — Telephone Encounter (Signed)
Per Dr Mayford Knife, Needs new Home sleep study.   New Itamar study ordered.

## 2023-05-31 NOTE — Telephone Encounter (Signed)
Prior Authorization for ITAMAR sent to [facility] via web portal. Tracking Number .  READY-NO PA REQ 

## 2023-05-31 NOTE — Telephone Encounter (Signed)
I s/w the pt that I received a message that she needs a repeat Itamar Sleep study. Dr. Okey Dupre had ordered the first sleep study. Dr. Mayford Knife who is our sleep cardiologist suggest pt repeat study. Pt would like to pick the repeat study in the [place] office as we see her there, instead of her coming to [place] to pick up a new device.  I did tell the pt that the PIN# is still the same [id number]. Pt states she will pick up the new device tomorrow. I assure the pt that I will let Dr. Okey Dupre and his nurse know to put a new Itamar device at the front desk to pick. Pt still has the watchpatone appt on her phone. Pt aware she does not need to see anyone tomorrow and just needs to pick up new device for repeat study.

## 2023-06-01 NOTE — Telephone Encounter (Signed)
Reached out to [employer] via secure chat to inquire if the patient needs another authorization from her insurance/ to sign another "patient responsibility waiver."  Per Okey Regal:  -no all approved still, this is just a repeat  - no other waiver needs to be signed, just document that she received a repeat study   WatchPat monitor pulled and placed at the front desk. Serial # [id number]  Registered in [employer].

## 2023-06-02 NOTE — Telephone Encounter (Signed)
Patient states she plans on stopping by the office in about 30 minutes to pick up Itamar device.

## 2023-06-02 NOTE — Telephone Encounter (Signed)
Noted- front desk staff made aware that the patient's WatchPat device has been placed up front for pick up.

## 2023-06-03 ENCOUNTER — Encounter (INDEPENDENT_AMBULATORY_CARE_PROVIDER_SITE_OTHER): Payer: PPO | Admitting: Cardiology

## 2023-06-03 DIAGNOSIS — G4733 Obstructive sleep apnea (adult) (pediatric): Secondary | ICD-10-CM

## 2023-06-05 ENCOUNTER — Ambulatory Visit: Payer: PPO | Attending: Cardiology

## 2023-06-05 DIAGNOSIS — G4700 Insomnia, unspecified: Secondary | ICD-10-CM

## 2023-06-05 DIAGNOSIS — G4733 Obstructive sleep apnea (adult) (pediatric): Secondary | ICD-10-CM

## 2023-06-05 DIAGNOSIS — I10 Essential (primary) hypertension: Secondary | ICD-10-CM

## 2023-06-05 NOTE — Procedures (Signed)
SLEEP STUDY REPORT Patient Information Study Date: 06/03/2023 Patient Name: Michelle Cisneros Patient ID: [id number] Birth Date: 1956-09-20 Age: 67 Gender:  BMI: 31.2 (W=194 lb, H=5' 6'') Referring Physician:  Yvonne Kendall, MD  TEST DESCRIPTION:  Home sleep apnea testing was completed using the WatchPat, a Type 1 device, utilizing peripheral arterial tonometry (PAT), chest movement, actigraphy, pulse oximetry, pulse rate, body position and snore.  AHI was calculated with apnea and hypopnea using valid sleep time as the denominator. RDI includes apneas, hypopneas, and RERAs.  The data acquired and the scoring of sleep and all associated events were performed in accordance with the recommended standards and specifications as outlined in the AASM Manual for the Scoring of Sleep and Associated Events 2.2.0 (2015).  FINDINGS:  1.  Moderate Obstructive Sleep Apnea with AHI 26.9/hr.   2.  No Central Sleep Apnea with pAHIc 3.1/hr.  3.  Oxygen desaturations as low as 69%.  4.  Severe snoring was present. O2 sats were < 88% for 20.9 min.  5.  Total sleep time was 5 hrs and 19 min.  6.  22.5% of total sleep time was spent in REM sleep.   7.  Normal sleep onset latency at 22 min  8.  Normal REM sleep onset latency at 95 min.   9.  Total awakenings were 21.  10. Arrhythmia detection:  Suggestive of possible brief atrial fibrillation lasting 1 minute and 24 seconds.  This is not diagnostic and further testing with outpatient telemetry monitoring is recommended.  DIAGNOSIS:   Moderate Obstructive Sleep Apnea (G47.33) Nocturnal Hypoxemia Possible Atrial Fibrillation  RECOMMENDATIONS:   1.  Clinical correlation of these findings is necessary.  The decision to treat obstructive sleep apnea (OSA) is usually based on the presence of apnea symptoms or the presence of associated medical conditions such as Hypertension, Congestive Heart Failure, Atrial Fibrillation or Obesity.  The most common  symptoms of OSA are snoring, gasping for breath while sleeping, daytime sleepiness and fatigue.   2.  Initiating apnea therapy is recommended given the presence of symptoms and/or associated conditions. Recommend proceeding with one of the following:     a.  Auto-CPAP therapy with a pressure range of 5-20cm H2O.     b.  An oral appliance (OA) that can be obtained from certain dentists with expertise in sleep medicine.  These are primarily of use in non-obese patients with mild and moderate disease.     c.  An ENT consultation which may be useful to look for specific causes of obstruction and possible treatment options.     d.  If patient is intolerant to PAP therapy, consider referral to ENT for evaluation for hypoglossal nerve stimulator.   3.  Close follow-up is necessary to ensure success with CPAP or oral appliance therapy for maximum benefit.  4.  A follow-up oximetry study on CPAP is recommended to assess the adequacy of therapy and determine the need for supplemental oxygen or the potential need for Bi-level therapy.  An arterial blood gas to determine the adequacy of baseline ventilation and oxygenation should also be considered.  5.  Healthy sleep recommendations include:  adequate nightly sleep (normal 7-9 hrs/night), avoidance of caffeine after noon and alcohol near bedtime, and maintaining a sleep environment that is cool, dark and quiet.  6.  Weight loss for overweight patients is recommended.  Even modest amounts of weight loss can significantly improve the severity of sleep apnea.  7.  Snoring recommendations include:  weight  loss where appropriate, side sleeping, and avoidance of alcohol before bed.  8.  Operation of motor vehicle should not be performed when sleepy.  Signature: Armanda Magic, MD; [facility]; Diplomat, American Board of Sleep Medicine Electronically Signed: 06/05/2023 12:13:19 PM

## 2023-06-14 DIAGNOSIS — R300 Dysuria: Secondary | ICD-10-CM | POA: Diagnosis not present

## 2023-06-14 DIAGNOSIS — M545 Low back pain, unspecified: Secondary | ICD-10-CM | POA: Diagnosis not present

## 2023-06-21 DIAGNOSIS — M545 Low back pain, unspecified: Secondary | ICD-10-CM | POA: Diagnosis not present

## 2023-06-21 DIAGNOSIS — Z87440 Personal history of urinary (tract) infections: Secondary | ICD-10-CM | POA: Diagnosis not present

## 2023-06-21 DIAGNOSIS — Z79899 Other long term (current) drug therapy: Secondary | ICD-10-CM | POA: Diagnosis not present

## 2023-06-21 DIAGNOSIS — Z792 Long term (current) use of antibiotics: Secondary | ICD-10-CM | POA: Diagnosis not present

## 2023-07-07 DIAGNOSIS — M47816 Spondylosis without myelopathy or radiculopathy, lumbar region: Secondary | ICD-10-CM | POA: Diagnosis not present

## 2023-07-07 DIAGNOSIS — M4316 Spondylolisthesis, lumbar region: Secondary | ICD-10-CM | POA: Diagnosis not present

## 2023-07-07 DIAGNOSIS — M545 Low back pain, unspecified: Secondary | ICD-10-CM | POA: Diagnosis not present

## 2023-07-07 DIAGNOSIS — M5137 Other intervertebral disc degeneration, lumbosacral region: Secondary | ICD-10-CM | POA: Diagnosis not present

## 2023-07-07 DIAGNOSIS — R29890 Loss of height: Secondary | ICD-10-CM | POA: Diagnosis not present

## 2023-07-15 ENCOUNTER — Other Ambulatory Visit: Payer: Self-pay | Admitting: Family Medicine

## 2023-07-15 DIAGNOSIS — Z86010 Personal history of colonic polyps: Secondary | ICD-10-CM | POA: Diagnosis not present

## 2023-07-15 DIAGNOSIS — Z800 Family history of malignant neoplasm of digestive organs: Secondary | ICD-10-CM | POA: Diagnosis not present

## 2023-07-15 DIAGNOSIS — R937 Abnormal findings on diagnostic imaging of other parts of musculoskeletal system: Secondary | ICD-10-CM

## 2023-07-15 DIAGNOSIS — M545 Low back pain, unspecified: Secondary | ICD-10-CM

## 2023-07-15 DIAGNOSIS — S32010A Wedge compression fracture of first lumbar vertebra, initial encounter for closed fracture: Secondary | ICD-10-CM

## 2023-07-16 ENCOUNTER — Ambulatory Visit
Admission: RE | Admit: 2023-07-16 | Discharge: 2023-07-16 | Disposition: A | Discharge status: Home / Self Care | Payer: PPO | Source: Ambulatory Visit | Attending: Family Medicine | Admitting: Family Medicine

## 2023-07-16 DIAGNOSIS — R937 Abnormal findings on diagnostic imaging of other parts of musculoskeletal system: Secondary | ICD-10-CM | POA: Diagnosis not present

## 2023-07-16 DIAGNOSIS — S32010A Wedge compression fracture of first lumbar vertebra, initial encounter for closed fracture: Secondary | ICD-10-CM | POA: Insufficient documentation

## 2023-07-16 DIAGNOSIS — M545 Low back pain, unspecified: Secondary | ICD-10-CM | POA: Diagnosis not present

## 2023-07-16 DIAGNOSIS — M4807 Spinal stenosis, lumbosacral region: Secondary | ICD-10-CM | POA: Diagnosis not present

## 2023-07-16 DIAGNOSIS — M48061 Spinal stenosis, lumbar region without neurogenic claudication: Secondary | ICD-10-CM | POA: Diagnosis not present

## 2023-07-16 DIAGNOSIS — M4855XA Collapsed vertebra, not elsewhere classified, thoracolumbar region, initial encounter for fracture: Secondary | ICD-10-CM | POA: Diagnosis not present

## 2023-07-16 DIAGNOSIS — M47816 Spondylosis without myelopathy or radiculopathy, lumbar region: Secondary | ICD-10-CM | POA: Diagnosis not present

## 2023-07-20 DIAGNOSIS — R4189 Other symptoms and signs involving cognitive functions and awareness: Secondary | ICD-10-CM | POA: Diagnosis not present

## 2023-07-20 DIAGNOSIS — G5601 Carpal tunnel syndrome, right upper limb: Secondary | ICD-10-CM | POA: Diagnosis not present

## 2023-07-20 DIAGNOSIS — Z8679 Personal history of other diseases of the circulatory system: Secondary | ICD-10-CM | POA: Diagnosis not present

## 2023-07-20 DIAGNOSIS — Q039 Congenital hydrocephalus, unspecified: Secondary | ICD-10-CM | POA: Diagnosis not present

## 2023-07-21 DIAGNOSIS — R4189 Other symptoms and signs involving cognitive functions and awareness: Secondary | ICD-10-CM | POA: Diagnosis not present

## 2023-07-22 ENCOUNTER — Other Ambulatory Visit: Payer: Self-pay | Admitting: Internal Medicine

## 2023-07-22 MED ORDER — LISINOPRIL 20 MG PO TABS: 20.0000 mg | ORAL_TABLET | Freq: Every day | ORAL | 3 refills | Status: DC

## 2023-07-22 NOTE — Telephone Encounter (Signed)
Refill sent to requested pharmacy.

## 2023-07-29 ENCOUNTER — Ambulatory Visit: Payer: PPO

## 2023-07-29 DIAGNOSIS — Z86010 Personal history of colonic polyps: Secondary | ICD-10-CM | POA: Diagnosis not present

## 2023-07-29 DIAGNOSIS — K5730 Diverticulosis of large intestine without perforation or abscess without bleeding: Secondary | ICD-10-CM | POA: Diagnosis not present

## 2023-07-29 DIAGNOSIS — D122 Benign neoplasm of ascending colon: Secondary | ICD-10-CM | POA: Diagnosis not present

## 2023-07-29 DIAGNOSIS — Z09 Encounter for follow-up examination after completed treatment for conditions other than malignant neoplasm: Secondary | ICD-10-CM | POA: Diagnosis not present

## 2023-08-09 DIAGNOSIS — M47816 Spondylosis without myelopathy or radiculopathy, lumbar region: Secondary | ICD-10-CM | POA: Diagnosis not present

## 2023-08-16 DIAGNOSIS — M47816 Spondylosis without myelopathy or radiculopathy, lumbar region: Secondary | ICD-10-CM | POA: Diagnosis not present

## 2023-08-31 DIAGNOSIS — M47816 Spondylosis without myelopathy or radiculopathy, lumbar region: Secondary | ICD-10-CM | POA: Diagnosis not present

## 2023-09-15 NOTE — Progress Notes (Signed)
Notified patient of sleep study results and recommendations.All questions were answered, patient verbalized understanding. Order for CPAP and supplies sent to [facility] today 09/15/23.

## 2023-09-15 NOTE — Addendum Note (Signed)
Addended by: Brunetta Genera on: 09/15/2023 01:46 PM   Modules accepted: Orders

## 2023-09-28 DIAGNOSIS — M47816 Spondylosis without myelopathy or radiculopathy, lumbar region: Secondary | ICD-10-CM | POA: Diagnosis not present

## 2023-10-18 ENCOUNTER — Ambulatory Visit: Payer: PPO | Admitting: Dermatology

## 2023-10-18 DIAGNOSIS — L814 Other melanin hyperpigmentation: Secondary | ICD-10-CM

## 2023-10-18 DIAGNOSIS — D229 Melanocytic nevi, unspecified: Secondary | ICD-10-CM | POA: Diagnosis not present

## 2023-10-18 DIAGNOSIS — L820 Inflamed seborrheic keratosis: Secondary | ICD-10-CM | POA: Diagnosis not present

## 2023-10-18 DIAGNOSIS — L821 Other seborrheic keratosis: Secondary | ICD-10-CM | POA: Diagnosis not present

## 2023-10-18 DIAGNOSIS — W908XXA Exposure to other nonionizing radiation, initial encounter: Secondary | ICD-10-CM

## 2023-10-18 DIAGNOSIS — L719 Rosacea, unspecified: Secondary | ICD-10-CM | POA: Diagnosis not present

## 2023-10-18 DIAGNOSIS — Z85828 Personal history of other malignant neoplasm of skin: Secondary | ICD-10-CM

## 2023-10-18 DIAGNOSIS — L578 Other skin changes due to chronic exposure to nonionizing radiation: Secondary | ICD-10-CM | POA: Diagnosis not present

## 2023-10-18 DIAGNOSIS — Z1283 Encounter for screening for malignant neoplasm of skin: Secondary | ICD-10-CM

## 2023-10-18 DIAGNOSIS — D1801 Hemangioma of skin and subcutaneous tissue: Secondary | ICD-10-CM

## 2023-10-18 DIAGNOSIS — Z85820 Personal history of malignant melanoma of skin: Secondary | ICD-10-CM | POA: Diagnosis not present

## 2023-10-18 DIAGNOSIS — D225 Melanocytic nevi of trunk: Secondary | ICD-10-CM | POA: Diagnosis not present

## 2023-10-18 NOTE — Patient Instructions (Addendum)
Cryotherapy Aftercare  Wash gently with soap and water everyday.   Apply Vaseline and Band-Aid daily until healed.   Rosacea  What is rosacea? Rosacea (say: ro-zay-sha) is a common skin disease that usually begins as a trend of flushing or blushing easily.  As rosacea progresses, a persistent redness in the center of the face will develop and may gradually spread beyond the nose and cheeks to the forehead and chin.  In some cases, the ears, chest, and back could be affected.  Rosacea may appear as tiny blood vessels or small red bumps that occur in crops.  Frequently they can contain pus, and are called "pustules".  If the bumps do not contain pus, they are referred to as "papules".  Rarely, in prolonged, untreated cases of rosacea, the oil glands of the nose and cheeks may become permanently enlarged.  This is called rhinophyma, and is seen more frequently in men.  Signs and Risks In its beginning stages, rosacea tends to come and go, which makes it difficult to recognize.  It can start as intermittent flushing of the face.  Eventually, blood vessels may become permanently visible.  Pustules and papules can appear, but can be mistaken for adult acne.  People of all races, ages, genders and ethnic groups are at risk of developing rosacea.  However, it is more common in women (especially around menopause) and adults with fair skin between the ages of 18 and 31.  Treatment Dermatologists typically recommend a combination of treatments to effectively manage rosacea.  Treatment can improve symptoms and may stop the progression of the rosacea.  Treatment may involve both topical and oral medications.  The tetracycline antibiotics are often used for their anti-inflammatory effect; however, because of the possibility of developing antibiotic resistance, they should not be used long term at full dose.  For dilated blood vessels the options include electrodessication (uses electric current through a small  needle), laser treatment, and cosmetics to hide the redness.   With all forms of treatment, improvement is a slow process, and patients may not see any results for the first 3-4 weeks.  It is very important to avoid the sun and other triggers.  Patients must wear sunscreen daily.  Skin Care Instructions: Cleanse the skin with a mild soap such as CeraVe cleanser, Cetaphil cleanser, or Dove soap once or twice daily as needed. Moisturize with Eucerin Redness Relief Daily Perfecting Lotion (has a subtle green tint), CeraVe Moisturizing Cream, or Oil of Olay Daily Moisturizer with sunscreen every morning and/or night as recommended. Makeup should be "non-comedogenic" (won't clog pores) and be labeled "for sensitive skin". Good choices for cosmetics are: Neutrogena, Almay, and Physician's Formula.  Any product with a green tint tends to offset a red complexion. If your eyes are dry and irritated, use artificial tears 2-3 times per day and cleanse the eyelids daily with baby shampoo.  Have your eyes examined at least every 2 years.  Be sure to tell your eye doctor that you have rosacea. Alcoholic beverages tend to cause flushing of the skin, and may make rosacea worse. Always wear sunscreen, protect your skin from extreme hot and cold temperatures, and avoid spicy foods, hot drinks, and mechanical irritation such as rubbing, scrubbing, or massaging the face.  Avoid harsh skin cleansers, cleansing masks, astringents, and exfoliation. If a particular product burns or makes your face feel tight, then it is likely to flare your rosacea. If you are having difficulty finding a sunscreen that you can tolerate,  you may try switching to a chemical-free sunscreen.  These are ones whose active ingredient is zinc oxide or titanium dioxide only.  They should also be fragrance free, non-comedogenic, and labeled for sensitive skin. Rosacea triggers may vary from person to person.  There are a variety of foods that have been  reported to trigger rosacea.  Some patients find that keeping a diary of what they were doing when they flared helps them avoid triggers.   Due to recent changes in healthcare laws, you may see results of your pathology and/or laboratory studies on MyChart before the doctors have had a chance to review them. We understand that in some cases there may be results that are confusing or concerning to you. Please understand that not all results are received at the same time and often the doctors may need to interpret multiple results in order to provide you with the best plan of care or course of treatment. Therefore, we ask that you please give [place] 2 business days to thoroughly review all your results before contacting the office for clarification. Should we see a critical lab result, you will be contacted sooner.   If You Need Anything After Your Visit  If you have any questions or concerns for your doctor, please call our main line at [phone] and press option 4 to reach your doctor's medical assistant. If no one answers, please leave a voicemail as directed and we will return your call as soon as possible. Messages left after 4 pm will be answered the following business day.   You may also send [place] a message via MyChart. We typically respond to MyChart messages within 1-2 business days.  For prescription refills, please ask your pharmacy to contact our office. Our fax number is [phone].  If you have an urgent issue when the clinic is closed that cannot wait until the next business day, you can page your doctor at the number below.    Please note that while we do our best to be available for urgent issues outside of office hours, we are not available 24/7.   If you have an urgent issue and are unable to reach [place], you may choose to seek medical care at your doctor's office, retail clinic, urgent care center, or emergency room.  If you have a medical emergency, please immediately call 911 or  go to the emergency department.  Pager Numbers  - Dr. Gwen Pounds: [phone]  - Dr. Roseanne Reno: [phone]  - Dr. Katrinka Blazing: [phone]   In the event of inclement weather, please call our main line at [phone] for an update on the status of any delays or closures.  Dermatology Medication Tips: Please keep the boxes that topical medications come in in order to help keep track of the instructions about where and how to use these. Pharmacies typically print the medication instructions only on the boxes and not directly on the medication tubes.   If your medication is too expensive, please contact our office at [phone] option 4 or send [place] a message through MyChart.   We are unable to tell what your co-pay for medications will be in advance as this is different depending on your insurance coverage. However, we may be able to find a substitute medication at lower cost or fill out paperwork to get insurance to cover a needed medication.   If a prior authorization is required to get your medication covered by your insurance company, please allow [place] 1-2 business days to complete this  process.  Drug prices often vary depending on where the prescription is filled and some pharmacies may offer cheaper prices.  The website www.goodrx.com contains coupons for medications through different pharmacies. The prices here do not account for what the cost may be with help from insurance (it may be cheaper with your insurance), but the website can give you the price if you did not use any insurance.  - You can print the associated coupon and take it with your prescription to the pharmacy.  - You may also stop by our office during regular business hours and pick up a GoodRx coupon card.  - If you need your prescription sent electronically to a different pharmacy, notify our office through [facility] or by phone at [phone] option 4.     Si Usted Necesita Algo Despus de Su  Visita  Tambin puede enviarnos un mensaje a travs de Clinical cytogeneticist. Por lo general respondemos a los mensajes de MyChart en el transcurso de 1 a 2 das hbiles.  Para renovar recetas, por favor pida a su farmacia que se ponga en contacto con nuestra oficina. Annie Sable de fax es [place] [phone].  Si tiene un asunto urgente cuando la clnica est cerrada y que no puede esperar hasta el siguiente da hbil, puede llamar/localizar a su doctor(a) al nmero que aparece a continuacin.   Por favor, tenga en cuenta que aunque hacemos todo lo posible para estar disponibles para asuntos urgentes fuera del horario de [place], no estamos disponibles las 24 horas del da, los 7 [address]  [address] de la [place].   Si tiene un problema urgente y no puede comunicarse con nosotros, puede optar por buscar atencin mdica  en el consultorio de su doctor(a), en una clnica privada, en un centro de atencin urgente o en una sala de emergencias.  Si tiene Engineer, drilling, por favor llame inmediatamente al 911 o vaya a la sala de emergencias.  Nmeros de bper  - Dr. Gwen Pounds: [phone]  - Dra. Roseanne Reno: [phone]  - Dr. Katrinka Blazing: [phone]   En caso de inclemencias del tiempo, por favor llame a Lacy Duverney principal al [phone] para una actualizacin sobre el [place] de cualquier retraso o cierre.  Consejos para la medicacin en dermatologa: Por favor, guarde las cajas en las que vienen los medicamentos de uso tpico para ayudarle a seguir las instrucciones sobre dnde y cmo usarlos. Las farmacias generalmente imprimen las instrucciones del medicamento slo en las cajas y no directamente en los tubos del [place].   Si su medicamento es muy caro, por favor, pngase en contacto con Rolm Gala llamando al [phone] y presione la opcin 4 o envenos un mensaje a travs de Clinical cytogeneticist.   No podemos decirle cul ser su copago por los medicamentos por adelantado ya que esto es diferente dependiendo de  la cobertura de su seguro. Sin embargo, es posible que podamos encontrar un medicamento sustituto a Audiological scientist un formulario para que el seguro cubra el medicamento que se considera necesario.   Si se requiere una autorizacin previa para que su compaa de seguros [place] su medicamento, por favor permtanos de 1 a 2 das hbiles para completar [address]  Los precios de los medicamentos varan con frecuencia dependiendo del Environmental consultant de dnde se surte la receta y alguna farmacias pueden ofrecer precios ms baratos.  El sitio web www.goodrx.com tiene cupones para medicamentos de Health and safety inspector. Los precios aqu no tienen en cuenta lo que podra costar con la ayuda del seguro (  puede ser ms barato con su seguro), pero el sitio web puede darle el precio si no Visual merchandiser.  - Puede imprimir el cupn correspondiente y llevarlo con su receta a la farmacia.  - Tambin puede pasar por nuestra oficina durante el horario de atencin regular y Education officer, museum una tarjeta de cupones de GoodRx.  - Si necesita que su receta se enve electrnicamente a una farmacia diferente, informe a nuestra oficina a travs de MyChart de [place] o por telfono llamando al [phone] y presione la opcin 4.

## 2023-10-18 NOTE — Progress Notes (Signed)
Follow-Up Visit   Subjective  Michelle Cisneros is a 67 y.o. female who presents for the following: Skin Cancer Screening and Full Body Skin Exam  The patient presents for Total-Body Skin Exam (TBSE) for skin cancer screening and mole check. The patient has spots, moles and lesions to be evaluated, some may be new or changing. She has a history of melanoma, SCC, and BCC. She has a spot on her left forehead that bleeds at times, present a little over a month. Also a spot on her scalp that is raised.     The following portions of the chart were reviewed this encounter and updated as appropriate: medications, allergies, medical history  Review of Systems:  No other skin or systemic complaints except as noted in HPI or Assessment and Plan.  Objective  Well appearing patient in no apparent distress; mood and affect are within normal limits.  A full examination was performed including scalp, head, eyes, ears, nose, lips, neck, chest, axillae, abdomen, back, buttocks, bilateral upper extremities, bilateral lower extremities, hands, feet, fingers, toes, fingernails, and toenails. All findings within normal limits unless otherwise noted below.   Relevant physical exam findings are noted in the Assessment and Plan.  mid crown scalp x 3, L forehead x 1, R upper temple x 1 (5) Erythematous stuck-on, waxy papule or plaque    Assessment & Plan   SKIN CANCER SCREENING PERFORMED TODAY.  ACTINIC DAMAGE - Chronic condition, secondary to cumulative UV/sun exposure - diffuse scaly erythematous macules with underlying dyspigmentation - Recommend daily broad spectrum sunscreen SPF 30+ to sun-exposed areas, reapply every 2 hours as needed.  - Staying in the shade or wearing long sleeves, sun glasses (UVA+UVB protection) and wide brim hats (4-inch brim around the entire circumference of the hat) are also recommended for sun protection.  - Call for new or changing lesions.  LENTIGINES, SEBORRHEIC  KERATOSES, HEMANGIOMAS - Benign normal skin lesions - Benign-appearing - Call for any changes  MELANOCYTIC NEVI - Tan-brown and/or pink-flesh-colored symmetric macules and papules - Right Lower Back: 5.0 x 2.35mm med light brown macule  - Left medial buttock: 8.17mm med light brown macule - Benign appearing on exam today - Observation - Call clinic for new or changing moles - Recommend daily use of broad spectrum spf 30+ sunscreen to sun-exposed areas.   History of Melanoma (2015) - No evidence of recurrence today of the right forearm - Recommend regular full body skin exams - Recommend daily broad spectrum sunscreen SPF 30+ to sun-exposed areas, reapply every 2 hours as needed.  - Call if any new or changing lesions are noted between office visits   History of Squamous Cell Carcinoma of the Skin Left medial ankle - No evidence of recurrence today  - Recommend regular full body skin exams - Recommend daily broad spectrum sunscreen SPF 30+ to sun-exposed areas, reapply every 2 hours as needed.  - Call if any new or changing lesions are noted between office visits   History of Basal Cell Carcinoma of the Skin Left forehead - No evidence of recurrence today  - Recommend regular full body skin exams - Recommend daily broad spectrum sunscreen SPF 30+ to sun-exposed areas, reapply every 2 hours as needed.  - Call if any new or changing lesions are noted between office visits   Inflamed seborrheic keratosis (5) mid crown scalp x 3, L forehead x 1, R upper temple x 1  Symptomatic, irritating, patient would like treated.  Recheck on follow-up.  Destruction of lesion - mid crown scalp x 3, L forehead x 1, R upper temple x 1 (5)  Destruction method: cryotherapy   Informed consent: discussed and consent obtained   Lesion destroyed using liquid nitrogen: Yes   Region frozen until ice ball extended beyond lesion: Yes   Outcome: patient tolerated procedure well with no complications    Post-procedure details: wound care instructions given   Additional details:  Prior to procedure, discussed risks of blister formation, small wound, skin dyspigmentation, or rare scar following cryotherapy. Recommend Vaseline ointment to treated areas while healing.   Rosacea  Related Medications metroNIDAZOLE (METROCREAM) 0.75 % cream Apply to face once to twice daily for rosacea.  ROSACEA Exam Erythema with telangiectasias nose and malar cheeks.  Chronic and persistent condition with duration or expected duration over one year. Condition is improving with treatment but not currently at goal.   Rosacea is a chronic progressive skin condition usually affecting the face of adults, causing redness and/or acne bumps. It is treatable but not curable. It sometimes affects the eyes (ocular rosacea) as well. It may respond to topical and/or systemic medication and can flare with stress, sun exposure, alcohol, exercise, topical steroids (including hydrocortisone/cortisone 10) and some foods.  Daily application of broad spectrum spf 30+ sunscreen to face is recommended to reduce flares.  Patient denies grittiness of the eyes  Treatment Plan Continue metronidazole 0.75% cream Apply once to twice daily for rosacea. Pt will call for refills.    Return in about 1 year (around 10/17/2024) for TBSE, Hx melanoma, Hx BCC, Hx SCC. Also 3 mo f/u to recheck ISKs, L forehead.Wendee Beavers, CMA, am acting as scribe for Willeen Niece, MD .   Documentation: I have reviewed the above documentation for accuracy and completeness, and I agree with the above.  Willeen Niece, MD

## 2023-10-25 DIAGNOSIS — M47816 Spondylosis without myelopathy or radiculopathy, lumbar region: Secondary | ICD-10-CM | POA: Diagnosis not present

## 2023-10-28 DIAGNOSIS — G4733 Obstructive sleep apnea (adult) (pediatric): Secondary | ICD-10-CM | POA: Diagnosis not present

## 2023-11-27 DIAGNOSIS — G4733 Obstructive sleep apnea (adult) (pediatric): Secondary | ICD-10-CM | POA: Diagnosis not present

## 2023-12-02 ENCOUNTER — Ambulatory Visit
Admission: EM | Admit: 2023-12-02 | Discharge: 2023-12-02 | Disposition: A | Discharge status: Home / Self Care | Payer: PPO | Attending: Family Medicine | Admitting: Family Medicine

## 2023-12-02 ENCOUNTER — Encounter: Payer: Self-pay | Admitting: Emergency Medicine

## 2023-12-02 DIAGNOSIS — N390 Urinary tract infection, site not specified: Secondary | ICD-10-CM | POA: Insufficient documentation

## 2023-12-02 LAB — URINALYSIS, W/ REFLEX TO CULTURE (INFECTION SUSPECTED)
Bilirubin Urine: NEGATIVE
Glucose, UA: NEGATIVE mg/dL
Nitrite: NEGATIVE
Protein, ur: NEGATIVE mg/dL
Specific Gravity, Urine: 1.025 (ref 1.005–1.030)
WBC, UA: >50 WBC/hpf (ref 0–5)
pH: 6 (ref 5.0–8.0)

## 2023-12-02 MED ORDER — SULFAMETHOXAZOLE-TRIMETHOPRIM 800-160 MG PO TABS: 1.0000 | ORAL_TABLET | Freq: Two times a day (BID) | ORAL | 0 refills | Status: AC

## 2023-12-02 MED ORDER — PHENAZOPYRIDINE HCL 200 MG PO TABS: 200.0000 mg | ORAL_TABLET | Freq: Three times a day (TID) | ORAL | 0 refills | Status: DC

## 2023-12-02 NOTE — Discharge Instructions (Addendum)
Take the Bactrim DS twice daily for 5 days with food for treatment of urinary tract infection.  Use the Pyridium every 8 hours as needed for urinary discomfort.  This will turn your urine a bright red-orange.  Increase your oral fluid intake so that you increase your urine production and or flushing your urinary system.  Take an over-the-counter probiotic, such as Culturelle-Align-Activia, 1 hour after each dose of antibiotic to prevent diarrhea or yeast infections from forming.  We will culture urine and change the antibiotics if necessary.  Hold your prophylactic Macrobid until after you have completed the Bactrim for treatment of your urinary tract infection.  You may also want to discuss with your PCP or your urologist about starting low-dose vaginal estrogen 2-3 times a week in place of the Macrobid to prevent recurrent UTIs.  Return for reevaluation, or see your primary care provider, for any new or worsening symptoms.

## 2023-12-02 NOTE — ED Triage Notes (Signed)
Pt c/o painful urination and urinary frequency for the last few hours.   Pt is on a low dose antibiotic for frequent Ut[place] - Nitrofurantoin.

## 2023-12-02 NOTE — ED Provider Notes (Signed)
[facility]    CSN: [id number] Arrival date & time: 12/02/23  1623      History   Chief Complaint Chief Complaint  Patient presents with   Urinary Tract Infection    HPI Michelle Cisneros is a 67 y.o. female.   HPI  67 year old female with a past medical history significant for hyperlipidemia, OSA, hydrocephalus, PSVT, aortic atherosclerosis, essential hypertension, and dyspnea on exertion presents for evaluation of painful urination and urinary frequency that started several hours ago.  She is currently on low-dose Macrobid for prevention of recurrent UTIs.  She states she has been on that medication for approximately 1 year and has not had a UTI in the past year.  Prior to that she was having 3 to 4-year.  She denies any fever, abdominal pain, back pain, nausea or vomiting, or blood in her urine.  Past Medical History:  Diagnosis Date   Aortic atherosclerosis (HCC)    Arthritis    Back pain    Basal cell carcinoma    L forehead, txted in past by another provider   Collagen vascular disease (HCC)    Depression    DOE (dyspnea on exertion)    a. 05/2020 Echo: EF 60-65%, no rwma, nl RV size/fxn.   History of stress test    a. 04/2020 Lexiscan MV: No ischemia/infarct.   Hyperlipemia    Hypertension    Melanoma (HCC) ~2015   R forearm txted in past by another provider   Recurrent UTI    Squamous cell carcinoma of skin    L med ankle, txted in past by another provider    Patient Active Problem List   Diagnosis Date Noted   PSVT (paroxysmal supraventricular tachycardia) (HCC) 05/27/2023   Recurrent falls 08/16/2022   Hydrocephalus (HCC) 08/16/2022   Depression 08/16/2022   OSA (obstructive sleep apnea) 04/27/2022   Palpitations 10/03/2021   Dyspnea on exertion 04/19/2020   Aortic atherosclerosis (HCC) 04/19/2020   Essential hypertension 04/19/2020   Hyperlipidemia 04/19/2020   Right carpal tunnel syndrome 11/09/2016    Past Surgical History:   Procedure Laterality Date   ABDOMINAL HYSTERECTOMY     COLONOSCOPY WITH PROPOFOL N/A 05/26/2020   Procedure: COLONOSCOPY WITH PROPOFOL;  Surgeon: Toledo, Boykin Nearing, MD;  Location: [facility] ENDOSCOPY;  Service: Gastroenterology;  Laterality: N/A;   INCONTINENCE SURGERY     OOPHORECTOMY      OB History     Gravida  3   Para  3   Term  3   Preterm      AB      Living  3      SAB      IAB      Ectopic      Multiple      Live Births  3            Home Medications    Prior to Admission medications   Medication Sig Start Date End Date Taking? Authorizing Provider  atorvastatin (LIPITOR) 10 MG tablet TAKE ONE TABLET BY MOUTH DAILY 02/13/18  Yes [provider]  buPROPion (WELLBUTRIN XL) 300 MG 24 hr tablet TAKE ONE TABLET BY MOUTH DAILY 02/13/18  Yes [provider]  Calcium Carbonate-Vitamin D 600-400 MG-UNIT tablet Take 1 tablet by mouth daily.   Yes [provider]  fluticasone (FLONASE) 50 MCG/ACT nasal spray Place 2 sprays into both nostrils daily.   Yes [provider]  lisinopril (ZESTRIL) 20 MG tablet Take 1 tablet (20 mg  total) by mouth daily. 07/22/23  Yes End, Cristal Deer, MD  metroNIDAZOLE (METROCREAM) 0.75 % cream Apply to face once to twice daily for rosacea. 04/19/22  Yes Willeen Niece, MD  nitrofurantoin, macrocrystal-monohydrate, (MACROBID) 100 MG capsule Take 1 capsule (100 mg total) by mouth daily. 03/21/23  Yes MacDiarmid, Lorin Picket, MD  phenazopyridine (PYRIDIUM) 200 MG tablet Take 1 tablet (200 mg total) by mouth 3 (three) times daily. 12/02/23  Yes Becky Augusta, NP  sulfamethoxazole-trimethoprim (BACTRIM DS) 800-160 MG tablet Take 1 tablet by mouth 2 (two) times daily for 5 days. 12/02/23 12/07/23 Yes Becky Augusta, NP  aspirin EC 81 MG tablet Take 1 tablet (81 mg total) by mouth daily. 04/13/21   End, Cristal Deer, MD    Family History Family History  Problem Relation Age of Onset   Cancer Father        laryngeal   Atrial  fibrillation Father    Stroke Mother    Breast cancer Neg Hx    Bladder Cancer Neg Hx    Kidney cancer Neg Hx     Social History Social History   Tobacco Use   Smoking status: Former    Current packs/day: 0.00    Average packs/day: 1 pack/day for 30.0 years (30.0 ttl pk-yrs)    Types: Cigarettes    Start date: 10/03/1957    Quit date: 10/04/1987    Years since quitting: 36.1    Passive exposure: Past   Smokeless tobacco: Never  Vaping Use   Vaping status: Never Used  Substance Use Topics   Alcohol use: Yes    Comment: occassional; once glass of wine   Drug use: Not Currently     Allergies   Patient has no known allergies.   Review of Systems Review of Systems  Constitutional:  Negative for fever.  Gastrointestinal:  Negative for abdominal pain, nausea and vomiting.  Genitourinary:  Positive for dysuria, frequency and urgency. Negative for hematuria.  Musculoskeletal:  Negative for back pain.     Physical Exam Triage Vital Signs ED Triage Vitals  Encounter Vitals Group     BP      Systolic BP Percentile      Diastolic BP Percentile      Pulse      Resp      Temp      Temp src      SpO2      Weight      Height      Head Circumference      Peak Flow      Pain Score      Pain Loc      Pain Education      Exclude from Growth Chart    No data found.  Updated Vital Signs BP 123/81 (BP Location: Left Arm)   Pulse 63   Temp 98.8 F (37.1 C) (Oral)   Ht 5\' 4"  (1.626 m)   Wt 164 lb (74.4 kg)   SpO2 99%   BMI 28.15 kg/m   Visual Acuity Right Eye Distance:   Left Eye Distance:   Bilateral Distance:    Right Eye Near:   Left Eye Near:    Bilateral Near:     Physical Exam Vitals and nursing note reviewed.  Constitutional:      Appearance: Normal appearance. She is not ill-appearing.  HENT:     Head: Normocephalic and atraumatic.  Cardiovascular:     Rate and Rhythm: Normal rate and regular rhythm.     Pulses:  Normal pulses.     Heart  sounds: Normal heart sounds. No murmur heard.    No friction rub. No gallop.  Pulmonary:     Effort: Pulmonary effort is normal.     Breath sounds: Normal breath sounds. No wheezing, rhonchi or rales.  Abdominal:     Tenderness: There is no right CVA tenderness or left CVA tenderness.  Skin:    General: Skin is warm and dry.     Capillary Refill: Capillary refill takes less than 2 seconds.     Findings: No rash.  Neurological:     General: No focal deficit present.     Mental Status: She is alert and oriented to person, place, and time.      UC Treatments / Results  Labs (all labs ordered are listed, but only abnormal results are displayed) Labs Reviewed  URINALYSIS, W/ REFLEX TO CULTURE (INFECTION SUSPECTED) - Abnormal; Notable for the following components:      Result Value   APPearance HAZY (*)    Hgb urine dipstick SMALL (*)    Ketones, ur TRACE (*)    Leukocytes,Ua MODERATE (*)    Bacteria, UA FEW (*)    All other components within normal limits  URINE CULTURE    EKG   Radiology No results found.  Procedures Procedures (including critical care time)  Medications Ordered in UC Medications - No data to display  Initial Impression / Assessment and Plan / UC Course  I have reviewed the triage vital signs and the nursing notes.  Pertinent labs & imaging results that were available during my care of the patient were reviewed by me and considered in my medical decision making (see chart for details).   Patient is a nontoxic-appearing 67 year old female presenting for evaluation of UTI symptoms which began several hours ago as outlined HPI above.  She does have a history of recurrent UTIs and is currently on low-dose Macrobid to prevent UTIs.  When asked, she states that no one ever talked to her about using low-dose vaginal estrogen to help prevent recurrent UTIs.  She is not having abdominal pain or back pain.  No hematuria.  She has no CVA tenderness on exam.  I will  order urinalysis to evaluate for the presence of UTI.  Urinalysis shows hazy appearance with small hemoglobin, trace ketones, and moderate leukocyte esterase.  Negative for nitrites or protein.  Reflex microscopy shows greater than 50 WBCs, 6-10 RBCs, WBC clumps, and few bacteria.  Urine will reflex to culture.  I will discharge patient home with a diagnosis of urinary tract infection and start her on Bactrim DS twice daily for 5 days for treatment of her UTI.  She should suspend her prophylactic Macrobid until after she has completed therapy for her UTI.  I have also prescribed Pyridium to help her with his urinary discomfort.  I will also encourage her to discuss with her PCP, or her urologist, about starting low-dose vaginal estrogen to help prevent recurrent UTIs.   Final Clinical Impressions(s) / UC Diagnoses   Final diagnoses:  Lower urinary tract infectious disease     Discharge Instructions      Take the Bactrim DS twice daily for 5 days with food for treatment of urinary tract infection.  Use the Pyridium every 8 hours as needed for urinary discomfort.  This will turn your urine a bright red-orange.  Increase your oral fluid intake so that you increase your urine production and or flushing your urinary system.  Take an over-the-counter probiotic, such as Culturelle-Align-Activia, 1 hour after each dose of antibiotic to prevent diarrhea or yeast infections from forming.  We will culture urine and change the antibiotics if necessary.  Hold your prophylactic Macrobid until after you have completed the Bactrim for treatment of your urinary tract infection.  You may also want to discuss with your PCP or your urologist about starting low-dose vaginal estrogen 2-3 times a week in place of the Macrobid to prevent recurrent UTIs.  Return for reevaluation, or see your primary care provider, for any new or worsening symptoms.      ED Prescriptions     Medication Sig Dispense Auth.  Provider   sulfamethoxazole-trimethoprim (BACTRIM DS) 800-160 MG tablet Take 1 tablet by mouth 2 (two) times daily for 5 days. 10 tablet Becky Augusta, NP   phenazopyridine (PYRIDIUM) 200 MG tablet Take 1 tablet (200 mg total) by mouth 3 (three) times daily. 6 tablet Becky Augusta, NP      PDMP not reviewed this encounter.   Becky Augusta, NP 12/02/23 [phone]

## 2023-12-04 LAB — URINE CULTURE: Culture: NO GROWTH

## 2023-12-09 ENCOUNTER — Inpatient Hospital Stay (HOSPITAL_COMMUNITY)
Admission: EM | Admit: 2023-12-09 | Discharge: 2023-12-13 | DRG: 522 | Disposition: A | Discharge status: Home Health | Payer: PPO | Attending: Internal Medicine | Admitting: Internal Medicine

## 2023-12-09 ENCOUNTER — Other Ambulatory Visit: Payer: Self-pay

## 2023-12-09 ENCOUNTER — Emergency Department (HOSPITAL_COMMUNITY): Payer: PPO

## 2023-12-09 ENCOUNTER — Encounter (HOSPITAL_COMMUNITY): Payer: Self-pay | Admitting: *Deleted

## 2023-12-09 DIAGNOSIS — S72012A Unspecified intracapsular fracture of left femur, initial encounter for closed fracture: Secondary | ICD-10-CM | POA: Diagnosis not present

## 2023-12-09 DIAGNOSIS — G4733 Obstructive sleep apnea (adult) (pediatric): Secondary | ICD-10-CM | POA: Diagnosis not present

## 2023-12-09 DIAGNOSIS — Z043 Encounter for examination and observation following other accident: Secondary | ICD-10-CM | POA: Diagnosis not present

## 2023-12-09 DIAGNOSIS — M25462 Effusion, left knee: Secondary | ICD-10-CM | POA: Diagnosis not present

## 2023-12-09 DIAGNOSIS — Z5986 Financial insecurity: Secondary | ICD-10-CM

## 2023-12-09 DIAGNOSIS — Z01818 Encounter for other preprocedural examination: Secondary | ICD-10-CM | POA: Diagnosis not present

## 2023-12-09 DIAGNOSIS — Z6828 Body mass index (BMI) 28.0-28.9, adult: Secondary | ICD-10-CM

## 2023-12-09 DIAGNOSIS — D72829 Elevated white blood cell count, unspecified: Secondary | ICD-10-CM | POA: Diagnosis not present

## 2023-12-09 DIAGNOSIS — M25552 Pain in left hip: Principal | ICD-10-CM

## 2023-12-09 DIAGNOSIS — M1611 Unilateral primary osteoarthritis, right hip: Secondary | ICD-10-CM | POA: Diagnosis not present

## 2023-12-09 DIAGNOSIS — S72002A Fracture of unspecified part of neck of left femur, initial encounter for closed fracture: Secondary | ICD-10-CM | POA: Diagnosis not present

## 2023-12-09 DIAGNOSIS — I10 Essential (primary) hypertension: Secondary | ICD-10-CM | POA: Diagnosis present

## 2023-12-09 DIAGNOSIS — Z800 Family history of malignant neoplasm of digestive organs: Secondary | ICD-10-CM | POA: Diagnosis not present

## 2023-12-09 DIAGNOSIS — Z90710 Acquired absence of both cervix and uterus: Secondary | ICD-10-CM | POA: Diagnosis not present

## 2023-12-09 DIAGNOSIS — Z85828 Personal history of other malignant neoplasm of skin: Secondary | ICD-10-CM | POA: Diagnosis not present

## 2023-12-09 DIAGNOSIS — Z7982 Long term (current) use of aspirin: Secondary | ICD-10-CM | POA: Diagnosis not present

## 2023-12-09 DIAGNOSIS — Z79899 Other long term (current) drug therapy: Secondary | ICD-10-CM | POA: Diagnosis not present

## 2023-12-09 DIAGNOSIS — Z823 Family history of stroke: Secondary | ICD-10-CM

## 2023-12-09 DIAGNOSIS — D62 Acute posthemorrhagic anemia: Secondary | ICD-10-CM | POA: Diagnosis not present

## 2023-12-09 DIAGNOSIS — G918 Other hydrocephalus: Secondary | ICD-10-CM | POA: Diagnosis present

## 2023-12-09 DIAGNOSIS — Z85820 Personal history of malignant melanoma of skin: Secondary | ICD-10-CM

## 2023-12-09 DIAGNOSIS — I700 Atherosclerosis of aorta: Secondary | ICD-10-CM | POA: Diagnosis not present

## 2023-12-09 DIAGNOSIS — L719 Rosacea, unspecified: Secondary | ICD-10-CM | POA: Diagnosis present

## 2023-12-09 DIAGNOSIS — Z96642 Presence of left artificial hip joint: Secondary | ICD-10-CM | POA: Diagnosis not present

## 2023-12-09 DIAGNOSIS — Z87440 Personal history of urinary (tract) infections: Secondary | ICD-10-CM | POA: Diagnosis not present

## 2023-12-09 DIAGNOSIS — F32A Depression, unspecified: Secondary | ICD-10-CM | POA: Diagnosis present

## 2023-12-09 DIAGNOSIS — E785 Hyperlipidemia, unspecified: Secondary | ICD-10-CM | POA: Diagnosis present

## 2023-12-09 DIAGNOSIS — Z87891 Personal history of nicotine dependence: Secondary | ICD-10-CM

## 2023-12-09 DIAGNOSIS — G8918 Other acute postprocedural pain: Secondary | ICD-10-CM | POA: Diagnosis not present

## 2023-12-09 DIAGNOSIS — W1830XA Fall on same level, unspecified, initial encounter: Secondary | ICD-10-CM | POA: Diagnosis present

## 2023-12-09 LAB — CBC WITH DIFFERENTIAL/PLATELET
Abs Immature Granulocytes: 0.05 10*3/uL (ref 0.00–0.07)
Basophils Absolute: 0 10*3/uL (ref 0.0–0.1)
Basophils Relative: 0 %
Eosinophils Absolute: 0 10*3/uL (ref 0.0–0.5)
Eosinophils Relative: 0 %
HCT: 40.9 % (ref 36.0–46.0)
Hemoglobin: 13.7 g/dL (ref 12.0–15.0)
Immature Granulocytes: 0 %
Lymphocytes Relative: 4 %
Lymphs Abs: 0.6 10*3/uL — ABNORMAL LOW (ref 0.7–4.0)
MCH: 30.4 pg (ref 26.0–34.0)
MCHC: 33.5 g/dL (ref 30.0–36.0)
MCV: 90.9 fL (ref 80.0–100.0)
Monocytes Absolute: 0.5 10*3/uL (ref 0.1–1.0)
Monocytes Relative: 4 %
Neutro Abs: 12.2 10*3/uL — ABNORMAL HIGH (ref 1.7–7.7)
Neutrophils Relative %: 92 %
Platelets: 255 10*3/uL (ref 150–400)
RBC: 4.5 MIL/uL (ref 3.87–5.11)
RDW: 12.3 % (ref 11.5–15.5)
WBC: 13.4 10*3/uL — ABNORMAL HIGH (ref 4.0–10.5)
nRBC: 0 % (ref 0.0–0.2)

## 2023-12-09 LAB — COMPREHENSIVE METABOLIC PANEL
ALT: 91 U/L — ABNORMAL HIGH (ref 0–44)
AST: 46 U/L — ABNORMAL HIGH (ref 15–41)
Albumin: 3.8 g/dL (ref 3.5–5.0)
Alkaline Phosphatase: 71 U/L (ref 38–126)
Anion gap: 10 (ref 5–15)
BUN: 12 mg/dL (ref 8–23)
CO2: 20 mmol/L — ABNORMAL LOW (ref 22–32)
Calcium: 9.1 mg/dL (ref 8.9–10.3)
Chloride: 104 mmol/L (ref 98–111)
Creatinine, Ser: 0.65 mg/dL (ref 0.44–1.00)
GFR, Estimated: >60 mL/min (ref >60)
Glucose, Bld: 113 mg/dL — ABNORMAL HIGH (ref 70–99)
Potassium: 3.9 mmol/L (ref 3.5–5.1)
Sodium: 134 mmol/L — ABNORMAL LOW (ref 135–145)
Total Bilirubin: 0.9 mg/dL (ref <1.2)
Total Protein: 6.9 g/dL (ref 6.5–8.1)

## 2023-12-09 MED ORDER — HYDROMORPHONE HCL 1 MG/ML IJ SOLN
0.5000 mg | Freq: Once | INTRAMUSCULAR | Status: AC
  Administered 2023-12-09: 0.5 mg via INTRAVENOUS
  Filled 2023-12-09: qty 1

## 2023-12-09 MED ORDER — HYDROCODONE-ACETAMINOPHEN 5-325 MG PO TABS: 1.0000 | ORAL_TABLET | Freq: Once | ORAL | Status: DC

## 2023-12-09 MED ORDER — FENTANYL CITRATE PF 50 MCG/ML IJ SOSY
50.0000 ug | PREFILLED_SYRINGE | Freq: Once | INTRAMUSCULAR | Status: AC
Start: 2023-12-09 — End: 2023-12-09
  Administered 2023-12-09: 50 ug via INTRAVENOUS
  Filled 2023-12-09: qty 1

## 2023-12-09 NOTE — ED Triage Notes (Signed)
The pt fell approx 1930 she has pain in her lt hip .    She was checked in here for at least 30 minutes before she arrived?????

## 2023-12-09 NOTE — ED Provider Notes (Addendum)
[place] EMERGENCY DEPARTMENT AT [facility] Provider Note  HPI   Michelle Cisneros is a 67 y.o. female patient with a PMHx of hyperlipidemia, OSA, hydrocephalus, PSVT, aortic atherosclerosis, essential hypertension, and dyspnea on exertion who is here today after a fall.  Patient [place] with her [place], and was not paying attention to the ground, and essentially had a mechanical fall tripped over a curb, ground-level fall.  She landed on her left side of her hip, and had pain in her left hip that shot down to the knee.  She is not on any blood thinners did not hit her head no loss of consciousness, required assistance to get up.   ROS Negative except as per HPI   Medical Decision Making   Upon presentation, the patient is afebrile HDS Patient has left hip tenderness, 2+ DP and PT pulses, sensation intact, able to move at the ankle   For this patient, the concern is hip fracture she is not neurovascularly compromised.  We will obtain some basic labs in case there is a fracture for operative planning, and we will get x-rays of the affected extremity.  Do not think she requires any additional traumatic imaging based on my exam, I see no other signs of trauma, she looks well otherwise she is in pain.  I did give her some fentanyl which helped her pain, however needed to be dose I gave her some Dilaudid.  Of note, this was a strictly mechanical fall, patient had no preceding symptoms  Clinical Course as of 12/09/23 2316  Fri Dec 09, 2023  2141 hyperlipidemia, OSA, hydrocephalus, PSVT, aortic atherosclerosis, essential hypertension, and dyspnea on exertion [JL]    Clinical Course User Index [JL] Gunnar Bulla, MD       Patient has a femoral neck fracture, we will be admitting this patient to medicine, Ortho plan for surgery tomorrow.  No diagnosis found.  @DISPOSITION @  Rx / DC Orders ED Discharge Orders     None        Past Medical History:  Diagnosis  Date   Aortic atherosclerosis (HCC)    Arthritis    Back pain    Basal cell carcinoma    L forehead, txted in past by another provider   Collagen vascular disease (HCC)    Depression    DOE (dyspnea on exertion)    a. 05/2020 Echo: EF 60-65%, no rwma, nl RV size/fxn.   History of stress test    a. 04/2020 Lexiscan MV: No ischemia/infarct.   Hyperlipemia    Hypertension    Melanoma (HCC) ~2015   R forearm txted in past by another provider   Recurrent UTI    Squamous cell carcinoma of skin    L med ankle, txted in past by another provider   Past Surgical History:  Procedure Laterality Date   ABDOMINAL HYSTERECTOMY     COLONOSCOPY WITH PROPOFOL N/A 05/26/2020   Procedure: COLONOSCOPY WITH PROPOFOL;  Surgeon: Toledo, Boykin Nearing, MD;  Location: [facility] ENDOSCOPY;  Service: Gastroenterology;  Laterality: N/A;   INCONTINENCE SURGERY     OOPHORECTOMY     Family History  Problem Relation Age of Onset   Cancer Father        laryngeal   Atrial fibrillation Father    Stroke Mother    Breast cancer Neg Hx    Bladder Cancer Neg Hx    Kidney cancer Neg Hx    Social History   Socioeconomic History  Marital status: Married    Spouse name: Not on file   Number of children: Not on file   Years of education: Not on file   Highest education level: Not on file  Occupational History   Not on file  Tobacco Use   Smoking status: Former    Current packs/day: 0.00    Average packs/day: 1 pack/day for 30.0 years (30.0 ttl pk-yrs)    Types: Cigarettes    Start date: 10/03/1957    Quit date: 10/04/1987    Years since quitting: 36.2    Passive exposure: Past   Smokeless tobacco: Never  Vaping Use   Vaping status: Never Used  Substance and Sexual Activity   Alcohol use: Yes    Comment: occassional; once glass of wine   Drug use: Not Currently   Sexual activity: Not on file  Other Topics Concern   Not on file  Social History Narrative   Not on file   Social Drivers of Health    Financial Resource Strain: Medium Risk (03/17/2023)   Received from [facility], [facility]   Overall Financial Resource Strain (CARDIA)    Difficulty of Paying Living Expenses: Somewhat hard  Food Insecurity: Food Insecurity Present (03/17/2023)   Received from [facility], [facility]   Hunger Vital Sign    Worried About Running Out of Food in the Last Year: Never true    Ran Out of Food in the Last Year: Sometimes true  Transportation Needs: No Transportation Needs (03/17/2023)   Received from [facility], [facility]   [facility] - Transportation    In the past 12 months, has lack of transportation kept you from medical appointments or from getting medications?: No    Lack of Transportation (Non-Medical): No  Physical Activity: Not on file  Stress: Not on file  Social Connections: Not on file  Intimate Partner Violence: Not on file     Physical Exam   Vitals:   12/09/23 2110 12/09/23 2116  BP: (!) 170/82   Pulse: 80   Resp: 18   Temp: 98.1 F (36.7 C)   TempSrc: Oral   SpO2: 97%   Weight:  74.4 kg  Height:  5\' 4"  (1.626 m)    Physical Exam Vitals and nursing note reviewed.  Constitutional:      General: She is not in acute distress.    Appearance: She is well-developed.  HENT:     Head: Normocephalic and atraumatic.  Eyes:     Conjunctiva/sclera: Conjunctivae normal.  Cardiovascular:     Rate and Rhythm: Normal rate and regular rhythm.     Heart sounds: No murmur heard. Pulmonary:     Effort: Pulmonary effort is normal. No respiratory distress.     Breath sounds: Normal breath sounds.  Abdominal:     Palpations: Abdomen is soft.     Tenderness: There is no abdominal tenderness.  Musculoskeletal:        General: No swelling.     Cervical back: Neck supple.     Comments: Patient has left hip tenderness, 2+ DP and PT pulses, sensation intact, able to move  at the ankle  Skin:    General: Skin is warm and dry.     Capillary Refill: Capillary refill takes less than 2 seconds.  Neurological:     General: No focal deficit present.     Mental Status: She is alert and oriented to person,  place, and time.  Psychiatric:        Mood and Affect: Mood normal.      Procedures   If procedures were preformed on this patient, they are listed below:  Procedures  The patient was seen, evaluated, and treated in conjunction with the attending physician, who voiced agreement in the care provided.  Note generated using Dragon voice dictation software and may contain dictation errors. Please contact me for any clarification or with any questions.   Electronically signed by:  Osvaldo Shipper, M.D. (PGY-2)    Gunnar Bulla, MD 12/09/23 Lamar Blinks    Gunnar Bulla, MD 12/09/23 2316    Durwin Glaze, MD 12/09/23 [phone]

## 2023-12-09 NOTE — H&P (Signed)
History and Physical    Michelle Cisneros WGN:[id number] DOB: 04/10/1956 DOA: 12/09/2023  PCP: Jerl Mina, MD  Patient coming from: Home  I have personally briefly reviewed patient's old medical records in [facility]  Chief Complaint: Left hip pain after fall  HPI: Michelle Cisneros is a 67 y.o. female with medical history significant for HTN, HLD, pSVT, chronic hydrocephalus, lumbar spondylosis, OSA on CPAP who presented to the ED for evaluation of left hip pain after a fall.  Patient states she was [place] a [place] festival earlier today when she got distracted and stepped off a curb losing her balance.  She landed on her left side which resulted in immediate pain to her left hip.  She was unable to stand on her own.  She did not hit her head or lose consciousness.  She had no associated lightheadedness, dizziness, chest pain, dyspnea.  ED Course  Labs/Imaging on admission: I have personally reviewed following labs and imaging studies.  Initial vitals showed BP 170/82, pulse 80, RR 18, temp 98.1 F, SpO2 97% on room air  Labs show WBC 13.4, hemoglobin 13.7, platelets 255,000, sodium 134, potassium 3.9, bicarb 20, BUN 12, creatinine 0.65, serum glucose 113, AST 46, ALT 91, alk phos 71, total bilirubin 0.9.  Pelvic and left femur x-ray showed acute fracture of the proximal left femur.  Left knee x-ray showed marked severity degenerative changes, small joint effusion.  Patient was given IV Dilaudid and fentanyl.  EDP spoke with orthopedics who recommended medical admission and plan for surgical fixation tomorrow 12/21.  The hospitalist service was consulted to admit.  Review of Systems: All systems reviewed and are negative except as documented in history of present illness above.   Past Medical History:  Diagnosis Date   Aortic atherosclerosis (HCC)    Arthritis    Back pain    Basal cell carcinoma    L forehead, txted in past by another provider   Collagen vascular  disease (HCC)    Depression    DOE (dyspnea on exertion)    a. 05/2020 Echo: EF 60-65%, no rwma, nl RV size/fxn.   History of stress test    a. 04/2020 Lexiscan MV: No ischemia/infarct.   Hyperlipemia    Hypertension    Melanoma (HCC) ~2015   R forearm txted in past by another provider   Recurrent UTI    Squamous cell carcinoma of skin    L med ankle, txted in past by another provider    Past Surgical History:  Procedure Laterality Date   ABDOMINAL HYSTERECTOMY     COLONOSCOPY WITH PROPOFOL N/A 05/26/2020   Procedure: COLONOSCOPY WITH PROPOFOL;  Surgeon: Toledo, Boykin Nearing, MD;  Location: [facility] ENDOSCOPY;  Service: Gastroenterology;  Laterality: N/A;   INCONTINENCE SURGERY     OOPHORECTOMY      Social History:  reports that she quit smoking about 36 years ago. Her smoking use included cigarettes. She started smoking about 66 years ago. She has a 30 pack-year smoking history. She has been exposed to tobacco smoke. She has never used smokeless tobacco. She reports current alcohol use. She reports that she does not currently use drugs.  No Known Allergies  Family History  Problem Relation Age of Onset   Cancer Father        laryngeal   Atrial fibrillation Father    Stroke Mother    Breast cancer Neg Hx    Bladder Cancer Neg Hx    Kidney cancer Neg Hx  Prior to Admission medications   Medication Sig Start Date End Date Taking? Authorizing Provider  aspirin EC 81 MG tablet Take 1 tablet (81 mg total) by mouth daily. 04/13/21   End, Cristal Deer, MD  atorvastatin (LIPITOR) 10 MG tablet TAKE ONE TABLET BY MOUTH DAILY 02/13/18   [provider]  buPROPion (WELLBUTRIN XL) 300 MG 24 hr tablet TAKE ONE TABLET BY MOUTH DAILY 02/13/18   [provider]  Calcium Carbonate-Vitamin D 600-400 MG-UNIT tablet Take 1 tablet by mouth daily.    [provider]  fluticasone (FLONASE) 50 MCG/ACT nasal spray Place 2 sprays into both nostrils daily.    [provider]  lisinopril (ZESTRIL) 20 MG tablet Take 1 tablet (20 mg total) by mouth daily. 07/22/23   End, Cristal Deer, MD  metroNIDAZOLE (METROCREAM) 0.75 % cream Apply to face once to twice daily for rosacea. 04/19/22   Willeen Niece, MD  nitrofurantoin, macrocrystal-monohydrate, (MACROBID) 100 MG capsule Take 1 capsule (100 mg total) by mouth daily. 03/21/23   Alfredo Martinez, MD  phenazopyridine (PYRIDIUM) 200 MG tablet Take 1 tablet (200 mg total) by mouth 3 (three) times daily. 12/02/23   Becky Augusta, NP    Physical Exam: Vitals:   12/09/23 2110 12/09/23 2116  BP: (!) 170/82   Pulse: 80   Resp: 18   Temp: 98.1 F (36.7 C)   TempSrc: Oral   SpO2: 97%   Weight:  74.4 kg  Height:  5\' 4"  (1.626 m)   Constitutional: Resting in bed, NAD, calm, comfortable Eyes: EOMI, lids and conjunctivae normal ENMT: Mucous membranes are moist. Posterior pharynx clear of any exudate or lesions.Normal dentition.  Neck: normal, supple, no masses. Respiratory: clear to auscultation anteriorly, no wheezing, no crackles. Normal respiratory effort. No accessory muscle use.  Cardiovascular: Regular rate and rhythm, no murmurs / rubs / gallops. No extremity edema. 2+ pedal pulses. Abdomen: no tenderness, no masses palpated.  Musculoskeletal: no clubbing / cyanosis. No joint deformity upper and lower extremities.  ROM diminished LLE due to hip fracture Skin: no rashes, lesions, ulcers. No induration Neurologic: Sensation intact. Strength 5/5 both upper extremities, limited lower extremities due to left hip fracture. Psychiatric: Normal judgment and insight. Alert and oriented x 3. Normal mood.   EKG: Ordered and pending. Assessment/Plan Principal Problem:   Closed fracture of proximal end of left femur, initial encounter (HCC) Active Problems:   Essential hypertension   Hyperlipidemia   Depression   OSA (obstructive sleep apnea)   Michelle Cisneros is a 67 y.o. female with medical history significant for HTN,  HLD, pSVT, chronic hydrocephalus, lumbar spondylosis, OSA on CPAP who is admitted with acute left hip fracture after mechanical fall.  Assessment and Plan: Acute left proximal femur fracture: EDP discussed case with orthopedics who plan on surgical fixation 12/21.  Keep n.p.o. rest of tonight.  Continue analgesics as needed.  Hypertension: Continue lisinopril.  Hyperlipidemia: Continue atorvastatin.  Depression: Continue bupropion.  OSA: Recently started using CPAP one month ago but she would like to try to sleep without it tonight for comfort reasons.  Continue supplemental O2 as needed.   DVT prophylaxis: SCDs Start: 12/10/23 0015 Code Status: Full code, confirmed with patient on admission Family Communication: Daughter at bedside Disposition Plan: From home, dispo pending clinical progress Consults called: Orthopedics Severity of Illness: The appropriate patient status for this patient is INPATIENT. Inpatient status is judged to be reasonable and necessary in order to provide the required intensity of service to ensure the  patient's safety. The patient's presenting symptoms, physical exam findings, and initial radiographic and laboratory data in the context of their chronic comorbidities is felt to place them at high risk for further clinical deterioration. Furthermore, it is not anticipated that the patient will be medically stable for discharge from the hospital within 2 midnights of admission.   * I certify that at the point of admission it is my clinical judgment that the patient will require inpatient hospital care spanning beyond 2 midnights from the point of admission due to high intensity of service, high risk for further deterioration and high frequency of surveillance required.Darreld Mclean MD [facility]  If 7PM-7AM, please contact night-coverage [email or web address]  12/10/2023, 12:32 AM

## 2023-12-10 ENCOUNTER — Inpatient Hospital Stay (HOSPITAL_COMMUNITY): Payer: PPO | Admitting: Anesthesiology

## 2023-12-10 ENCOUNTER — Inpatient Hospital Stay (HOSPITAL_COMMUNITY): Payer: PPO

## 2023-12-10 DIAGNOSIS — S72012A Unspecified intracapsular fracture of left femur, initial encounter for closed fracture: Secondary | ICD-10-CM | POA: Diagnosis not present

## 2023-12-10 DIAGNOSIS — S72002A Fracture of unspecified part of neck of left femur, initial encounter for closed fracture: Secondary | ICD-10-CM | POA: Diagnosis not present

## 2023-12-10 LAB — BASIC METABOLIC PANEL
Anion gap: 9 (ref 5–15)
BUN: 11 mg/dL (ref 8–23)
CO2: 25 mmol/L (ref 22–32)
Calcium: 9.4 mg/dL (ref 8.9–10.3)
Chloride: 102 mmol/L (ref 98–111)
Creatinine, Ser: 0.86 mg/dL (ref 0.44–1.00)
GFR, Estimated: >60 mL/min (ref >60)
Glucose, Bld: 152 mg/dL — ABNORMAL HIGH (ref 70–99)
Potassium: 3.9 mmol/L (ref 3.5–5.1)
Sodium: 136 mmol/L (ref 135–145)

## 2023-12-10 LAB — VITAMIN D 25 HYDROXY (VIT D DEFICIENCY, FRACTURES): Vit D, 25-Hydroxy: 53.2 ng/mL (ref 30–100)

## 2023-12-10 LAB — CBC
HCT: 38.2 % (ref 36.0–46.0)
Hemoglobin: 13.1 g/dL (ref 12.0–15.0)
MCH: 30.5 pg (ref 26.0–34.0)
MCHC: 34.3 g/dL (ref 30.0–36.0)
MCV: 89 fL (ref 80.0–100.0)
Platelets: 233 10*3/uL (ref 150–400)
RBC: 4.29 MIL/uL (ref 3.87–5.11)
RDW: 12.2 % (ref 11.5–15.5)
WBC: 10.4 10*3/uL (ref 4.0–10.5)
nRBC: 0 % (ref 0.0–0.2)

## 2023-12-10 LAB — TYPE AND SCREEN
ABO/RH(D): O POS
Antibody Screen: NEGATIVE

## 2023-12-10 LAB — SURGICAL PCR SCREEN
MRSA, PCR: NEGATIVE
Staphylococcus aureus: NEGATIVE

## 2023-12-10 LAB — ABO/RH: ABO/RH(D): O POS

## 2023-12-10 LAB — HIV ANTIBODY (ROUTINE TESTING W REFLEX): HIV Screen 4th Generation wRfx: NONREACTIVE

## 2023-12-10 MED ORDER — BUPROPION HCL ER (XL) 150 MG PO TB24
300.0000 mg | ORAL_TABLET | Freq: Every day | ORAL | Status: DC
  Administered 2023-12-10 – 2023-12-13 (×3): 300 mg via ORAL
  Filled 2023-12-10 (×3): qty 2

## 2023-12-10 MED ORDER — SENNOSIDES-DOCUSATE SODIUM 8.6-50 MG PO TABS: 1.0000 | ORAL_TABLET | Freq: Every evening | ORAL | Status: DC | PRN

## 2023-12-10 MED ORDER — LISINOPRIL 20 MG PO TABS
20.0000 mg | ORAL_TABLET | Freq: Every day | ORAL | Status: DC
Start: 2023-12-10 — End: 2023-12-10

## 2023-12-10 MED ORDER — ROPIVACAINE HCL 5 MG/ML IJ SOLN
INTRAMUSCULAR | Status: DC | PRN
  Administered 2023-12-10: 30 mL via PERINEURAL

## 2023-12-10 MED ORDER — FENTANYL CITRATE PF 50 MCG/ML IJ SOSY: 50.0000 ug | PREFILLED_SYRINGE | Freq: Once | INTRAMUSCULAR | Status: DC

## 2023-12-10 MED ORDER — ACETAMINOPHEN 325 MG PO TABS: 650.0000 mg | ORAL_TABLET | Freq: Four times a day (QID) | ORAL | Status: DC | PRN

## 2023-12-10 MED ORDER — MUPIROCIN 2 % EX OINT
1.0000 | TOPICAL_OINTMENT | Freq: Two times a day (BID) | CUTANEOUS | Status: DC
  Administered 2023-12-10 – 2023-12-13 (×4): 1 via NASAL
  Filled 2023-12-10 (×3): qty 22

## 2023-12-10 MED ORDER — CHLORHEXIDINE GLUCONATE CLOTH 2 % EX PADS
6.0000 | MEDICATED_PAD | Freq: Every day | CUTANEOUS | Status: DC
Start: 2023-12-10 — End: 2023-12-12
  Administered 2023-12-10: 6 via TOPICAL

## 2023-12-10 MED ORDER — MORPHINE SULFATE (PF) 2 MG/ML IV SOLN
0.5000 mg | INTRAVENOUS | Status: DC | PRN
  Administered 2023-12-10: 0.5 mg via INTRAVENOUS
  Filled 2023-12-10: qty 1

## 2023-12-10 MED ORDER — FENTANYL CITRATE (PF) 100 MCG/2ML IJ SOLN
INTRAMUSCULAR | Status: AC
  Administered 2023-12-10: 50 ug
  Filled 2023-12-10: qty 2

## 2023-12-10 MED ORDER — ONDANSETRON HCL 4 MG/2ML IJ SOLN: 4.0000 mg | Freq: Four times a day (QID) | INTRAMUSCULAR | Status: DC | PRN

## 2023-12-10 MED ORDER — HYDROCODONE-ACETAMINOPHEN 5-325 MG PO TABS
1.0000 | ORAL_TABLET | Freq: Four times a day (QID) | ORAL | Status: DC | PRN
  Administered 2023-12-10 (×3): 1 via ORAL
  Filled 2023-12-10 (×3): qty 1

## 2023-12-10 MED ORDER — ATORVASTATIN CALCIUM 10 MG PO TABS
10.0000 mg | ORAL_TABLET | Freq: Every day | ORAL | Status: DC
  Administered 2023-12-10 – 2023-12-13 (×3): 10 mg via ORAL
  Filled 2023-12-10 (×3): qty 1

## 2023-12-10 NOTE — Progress Notes (Signed)
Pt arrived to the unit, assessed, informed of POC, daughter at bedside, call bell in reach, all needs addressed

## 2023-12-10 NOTE — Anesthesia Preprocedure Evaluation (Signed)
Anesthesia Evaluation  Patient identified by MRN, date of birth, ID band Patient awake    Reviewed: Allergy & Precautions, H&P , NPO status , Patient's Chart, lab work & pertinent test results  Airway Mallampati: II   Neck ROM: full    Dental   Pulmonary sleep apnea , former smoker   breath sounds clear to auscultation       Cardiovascular hypertension,  Rhythm:regular Rate:Normal     Neuro/Psych  PSYCHIATRIC DISORDERS  Depression     Neuromuscular disease    GI/Hepatic   Endo/Other    Renal/GU      Musculoskeletal  (+) Arthritis ,    Abdominal   Peds  Hematology   Anesthesia Other Findings   Reproductive/Obstetrics                             Anesthesia Physical Anesthesia Plan  ASA: 2  Anesthesia Plan: Regional   Post-op Pain Management:    Induction: Intravenous  PONV Risk Score and Plan: 2 and Treatment may vary due to age or medical condition  Airway Management Planned: Nasal Cannula  Additional Equipment:   Intra-op Plan:   Post-operative Plan:   Informed Consent: I have reviewed the patients History and Physical, chart, labs and discussed the procedure including the risks, benefits and alternatives for the proposed anesthesia with the patient or authorized representative who has indicated his/her understanding and acceptance.     Dental advisory given  Plan Discussed with: CRNA, Anesthesiologist and Surgeon  Anesthesia Plan Comments:        Anesthesia Quick Evaluation

## 2023-12-10 NOTE — Hospital Course (Signed)
Michelle Cisneros is a 67 y.o. female with medical history significant for HTN, HLD, pSVT, chronic hydrocephalus, lumbar spondylosis, OSA on CPAP who is admitted with acute left hip fracture after mechanical fall.

## 2023-12-10 NOTE — Anesthesia Procedure Notes (Signed)
Anesthesia Regional Block: Femoral nerve block   Pre-Anesthetic Checklist: , timeout performed,  Correct Patient, Correct Site, Correct Laterality,  Correct Procedure,, site marked,  Risks and benefits discussed,  Surgical consent,  Pre-op evaluation,  At surgeon's request and post-op pain management  Laterality: Left  Prep: chloraprep       Needles:  Injection technique: Single-shot  Needle Type: Echogenic Stimulator Needle     Needle Length: 9cm  Needle Gauge: 21     Additional Needles:   Procedures:, nerve stimulator,,,, intact distal pulses,     Nerve Stimulator or Paresthesia:  Response: Quadriceps muscle contraction, 0.45 mA  Additional Responses:   Narrative:  Start time: 12/10/2023 1:00 PM End time: 12/10/2023 1:06 PM Injection made incrementally with aspirations every 5 mL.  Performed by: Personally  Anesthesiologist: Achille Rich, MD  Additional Notes: Functioning IV was confirmed and monitors were applied.  A 90mm 21ga Arrow echogenic stimulator needle was used. Sterile prep and drape,hand hygiene and sterile gloves were used.  Negative aspiration and negative test dose prior to incremental administration of local anesthetic. The patient tolerated the procedure well.

## 2023-12-10 NOTE — Progress Notes (Signed)
PROGRESS NOTE  Michelle Cisneros IHK:[id number] DOB: Mar 17, 1956 DOA: 12/09/2023 PCP: Jerl Mina, MD   LOS: 1 day   Brief Narrative / Interim history: 67 year old female with history of HTN, HLD, paroxysmal SVT, chronic hydrocephalus, lumbar spondylosis, OSA on CPAP comes into the hospital with left hip pain after a fall.  This happened while she was at a [place] festival and stepped off a curb losing her balance.  Did not hit her head, no loss of consciousness.  She was found to have a hip fracture and admitted to the hospital  Subjective / 24h Interval events: Doing well this morning, no pain while in bed.  Daughter is at bedside  Assesement and Plan: Principal Problem:   Closed fracture of proximal end of left femur, initial encounter (HCC) Active Problems:   Essential hypertension   Hyperlipidemia   Depression   OSA (obstructive sleep apnea)  Principal problem Acute left proximal femur fracture-orthopedic surgery consulted, looks like she will have operative repair tomorrow.  Active problems Essential hypertension-normotensive this morning, hold home lisinopril perioperatively  Hyperlipidemia-continue atorvastatin  Depression-continue Wellbutrin  OSA-recently started using CPAP about a month ago, per admitting MD she would like to try to sleep without it for comfort reasons  Scheduled Meds:  atorvastatin  10 mg Oral Daily   buPROPion  300 mg Oral Daily   mupirocin ointment  1 Application Nasal BID   Continuous Infusions: PRN Meds:.acetaminophen, HYDROcodone-acetaminophen, morphine injection, ondansetron (ZOFRAN) IV, senna-docusate  Current Outpatient Medications  Medication Instructions   aspirin EC 81 mg, Oral, Daily   atorvastatin (LIPITOR) 10 MG tablet TAKE ONE TABLET BY MOUTH DAILY   buPROPion (WELLBUTRIN XL) 300 MG 24 hr tablet TAKE ONE TABLET BY MOUTH DAILY   Calcium Carbonate-Vitamin D 600-400 MG-UNIT tablet 1 tablet, Daily   fluticasone (FLONASE) 50  MCG/ACT nasal spray 1 spray, Daily PRN   lisinopril (ZESTRIL) 20 mg, Oral, Daily   metroNIDAZOLE (METROCREAM) 0.75 % cream Apply to face once to twice daily for rosacea.   nitrofurantoin (macrocrystal-monohydrate) (MACROBID) 100 mg, Oral, Daily   phenazopyridine (PYRIDIUM) 200 mg, Oral, 3 times daily    Diet Orders (From admission, onward)     Start     Ordered   12/11/23 0001  Diet NPO time specified  Diet effective midnight        12/10/23 0910   12/10/23 0910  Diet regular Room service appropriate? Yes; Fluid consistency: Thin  Diet effective now       Question Answer Comment  Room service appropriate? Yes   Fluid consistency: Thin      12/10/23 0910            DVT prophylaxis: SCDs Start: 12/10/23 0015   Lab Results  Component Value Date   PLT 233 12/10/2023      Code Status: Full Code  Family Communication: Daughter at bedside  Status is: Inpatient Remains inpatient appropriate because: Severity of illness  Level of care: Med-Surg  Consultants:  Orthopedic surgery  Objective: Vitals:   12/09/23 2116 12/10/23 0143 12/10/23 0444 12/10/23 0849  BP:  127/79 119/87 125/75  Pulse:  98 99 84  Resp:  18 18 17   Temp:  99.3 F (37.4 C) 98.8 F (37.1 C) 98.6 F (37 C)  TempSrc:  Oral Oral Oral  SpO2:  94% 96% 94%  Weight: 74.4 kg     Height: 5\' 4"  (1.626 m)       Intake/Output Summary (Last 24 hours) at 12/10/2023 7564 Last data filed  at 12/10/2023 0300 Gross per 24 hour  Intake --  Output 600 ml  Net -600 ml   Wt Readings from Last 3 Encounters:  12/09/23 74.4 kg  12/02/23 74.4 kg  05/26/23 73.5 kg    Examination:  Constitutional: NAD Eyes: no scleral icterus ENMT: Mucous membranes are moist.  Neck: normal, supple Respiratory: clear to auscultation bilaterally, no wheezing, no crackles. Normal respiratory effort.  Cardiovascular: Regular rate and rhythm, no murmurs / rubs / gallops. No LE edema.  Abdomen: non distended, no tenderness. Bowel  sounds positive.  Musculoskeletal: no clubbing / cyanosis.    Data Reviewed: I have independently reviewed following labs and imaging studies   CBC Recent Labs  Lab 12/09/23 2221 12/10/23 0607  WBC 13.4* 10.4  HGB 13.7 13.1  HCT 40.9 38.2  PLT 255 233  MCV 90.9 89.0  MCH 30.4 30.5  MCHC 33.5 34.3  RDW 12.3 12.2  LYMPHSABS 0.6*  --   MONOABS 0.5  --   EOSABS 0.0  --   BASOSABS 0.0  --     Recent Labs  Lab 12/09/23 2221 12/10/23 0607  NA 134* 136  K 3.9 3.9  CL 104 102  CO2 20* 25  GLUCOSE 113* 152*  BUN 12 11  CREATININE 0.65 0.86  CALCIUM 9.1 9.4  AST 46*  --   ALT 91*  --   ALKPHOS 71  --   BILITOT 0.9  --   ALBUMIN 3.8  --     ------------------------------------------------------------------------------------------------------------------ No results for input(s): "CHOL", "HDL", "LDLCALC", "TRIG", "CHOLHDL", "LDLDIRECT" in the last 72 hours.  No results found for: "HGBA1C" ------------------------------------------------------------------------------------------------------------------ No results for input(s): "TSH", "T4TOTAL", "T3FREE", "THYROIDAB" in the last 72 hours.  Invalid input(s): "FREET3"  Cardiac Enzymes No results for input(s): "CKMB", "TROPONINI", "MYOGLOBIN" in the last 168 hours.  Invalid input(s): "CK" ------------------------------------------------------------------------------------------------------------------ No results found for: "BNP"  CBG: No results for input(s): "GLUCAP" in the last 168 hours.  Recent Results (from the past 240 hours)  Urine Culture     Status: None   Collection Time: 12/02/23  4:52 PM   Specimen: Urine, Random  Result Value Ref Range Status   Specimen Description   Final    URINE, RANDOM Performed at [facility], [address], [place], [place] [address]    Special Requests   Final    NONE Reflexed from [id number] Performed at [facility], [address], [place],  [place] [address]    Culture   Final    NO GROWTH Performed at [facility], [address] [place], [place] [address]    Report Status 12/04/2023 FINAL  Final  Surgical PCR screen     Status: None   Collection Time: 12/10/23  5:33 AM   Specimen: Nasal Mucosa; Nasal Swab  Result Value Ref Range Status   MRSA, PCR NEGATIVE NEGATIVE Final   Staphylococcus aureus NEGATIVE NEGATIVE Final    Comment: (NOTE) The Xpert SA Assay (FDA approved for NASAL specimens in patients 78 years of age and older), is one component of a comprehensive surveillance program. It is not intended to diagnose infection nor to guide or monitor treatment. Performed at [facility], [address], [place], [place] [address]      Radiology Studies: Chest Portable 1 View Result Date: 12/10/2023 CLINICAL DATA:  Preoperative examination. EXAM: PORTABLE CHEST 1 VIEW COMPARISON:  07/05/2021 FINDINGS: Normal heart size and pulmonary vascularity. No focal airspace disease or consolidation  in the lungs. No blunting of costophrenic angles. No pneumothorax. Mediastinal contours appear intact. Degenerative changes in the spine and shoulders. IMPRESSION: No active disease. Electronically Signed   By: Burman Nieves M.D.   On: 12/10/2023 00:42   DG Femur Min 2 Views Left Result Date: 12/09/2023 CLINICAL DATA:  Status post fall. EXAM: LEFT FEMUR 2 VIEWS COMPARISON:  None Available. FINDINGS: There is acute fracture deformity extending through the neck of the proximal left femur. There is no evidence of dislocation. Soft tissues are unremarkable. IMPRESSION: Acute fracture of the proximal left femur. Electronically Signed   By: Aram Candela M.D.   On: 12/09/2023 22:58   DG Pelvis Portable Result Date: 12/09/2023 CLINICAL DATA:  Status post fall. EXAM: PORTABLE PELVIS 1-2 VIEWS COMPARISON:  None Available. FINDINGS: There is an acute fracture extending through the neck of the proximal left femur. Approximately 1/2  shaft width dorsal displacement of the distal fracture site is seen. There is no evidence of dislocation. IMPRESSION: Acute fracture of the proximal left femur. Electronically Signed   By: Aram Candela M.D.   On: 12/09/2023 22:57   DG Knee 2 Views Left Result Date: 12/09/2023 CLINICAL DATA:  Status post fall. EXAM: LEFT KNEE - 1-2 VIEW COMPARISON:  None Available. FINDINGS: No evidence of an acute fracture or dislocation. Large medial and lateral marginal osteophytes are seen. Moderate to marked severity tricompartmental joint space narrowing is noted. Multiple approximally 1.5 cm oval shaped soft tissue calcifications are seen adjacent to the lateral aspect of the distal left femoral shaft. A small joint effusion is suspected. IMPRESSION: 1. Marked severity degenerative changes. 2. Small joint effusion. Electronically Signed   By: Aram Candela M.D.   On: 12/09/2023 22:56     Pamella Pert, MD, PhD [facility]  Between 7 am - 7 pm I am available, please contact me via Amion (for emergencies) or Securechat (non urgent messages)  Between 7 pm - 7 am I am not available, please contact night coverage MD/APP via Amion

## 2023-12-10 NOTE — Plan of Care (Signed)
Brief Orthopedic Note  Patient with left femoral neck fracture. Spoke with my partner, Dr. Roda Shutters. He plans to treat the fracture operatively tomorrow. Okay for diet today. Non weight bearing left lower extremity. Okay for diet today. NPO at midnight. Dr. Roda Shutters plans to come by and see the patient.   London Sheer, MD Orthopedic Surgeon

## 2023-12-10 NOTE — Consult Note (Signed)
ORTHOPAEDIC CONSULTATION  REQUESTING PHYSICIAN: Leatha Gilding, MD  Chief Complaint: Left femoral neck fracture  HPI: 67 year old female with history of HTN, HLD, paroxysmal SVT, chronic hydrocephalus, lumbar spondylosis, OSA on CPAP comes into the hospital with left hip pain after a fall. This happened while she [place] and stepped off a curb losing her balance. Did not hit her head, no loss of consciousness. She was found to have a hip fracture and admitted to the hospital  Ortho consulted for surgical evaluation.  Past Medical History:  Diagnosis Date   Aortic atherosclerosis (HCC)    Arthritis    Back pain    Basal cell carcinoma    L forehead, txted in past by another provider   Collagen vascular disease (HCC)    Depression    DOE (dyspnea on exertion)    a. 05/2020 Echo: EF 60-65%, no rwma, nl RV size/fxn.   History of stress test    a. 04/2020 Lexiscan MV: No ischemia/infarct.   Hyperlipemia    Hypertension    Melanoma (HCC) ~2015   R forearm txted in past by another provider   Recurrent UTI    Squamous cell carcinoma of skin    L med ankle, txted in past by another provider   Past Surgical History:  Procedure Laterality Date   ABDOMINAL HYSTERECTOMY     COLONOSCOPY WITH PROPOFOL N/A 05/26/2020   Procedure: COLONOSCOPY WITH PROPOFOL;  Surgeon: Toledo, Boykin Nearing, MD;  Location: [facility] ENDOSCOPY;  Service: Gastroenterology;  Laterality: N/A;   INCONTINENCE SURGERY     OOPHORECTOMY     Social History   Socioeconomic History   Marital status: Married    Spouse name: Not on file   Number of children: Not on file   Years of education: Not on file   Highest education level: Not on file  Occupational History   Not on file  Tobacco Use   Smoking status: Former    Current packs/day: 0.00    Average packs/day: 1 pack/day for 30.0 years (30.0 ttl pk-yrs)    Types: Cigarettes    Start date: 10/03/1957    Quit date: 10/04/1987    Years since  quitting: 36.2    Passive exposure: Past   Smokeless tobacco: Never  Vaping Use   Vaping status: Never Used  Substance and Sexual Activity   Alcohol use: Yes    Comment: occassional; once glass of wine   Drug use: Not Currently   Sexual activity: Not on file  Other Topics Concern   Not on file  Social History Narrative   Not on file   Social Drivers of Health   Financial Resource Strain: Medium Risk (03/17/2023)   Received from [facility], [facility]   Overall Financial Resource Strain (CARDIA)    Difficulty of Paying Living Expenses: Somewhat hard  Food Insecurity: No Food Insecurity (12/10/2023)   Hunger Vital Sign    Worried About Running Out of Food in the Last Year: Never true    Ran Out of Food in the Last Year: Never true  Transportation Needs: No Transportation Needs (12/10/2023)   PRAPARE - Administrator, Civil Service (Medical): No    Lack of Transportation (Non-Medical): No  Physical Activity: Not on file  Stress: Not on file  Social Connections: Not on file   Family History  Problem Relation Age of Onset   Cancer Father  laryngeal   Atrial fibrillation Father    Stroke Mother    Breast cancer Neg Hx    Bladder Cancer Neg Hx    Kidney cancer Neg Hx    No Known Allergies Prior to Admission medications   Medication Sig Start Date End Date Taking? Authorizing Provider  aspirin EC 81 MG tablet Take 1 tablet (81 mg total) by mouth daily. 04/13/21  Yes End, Cristal Deer, MD  atorvastatin (LIPITOR) 10 MG tablet TAKE ONE TABLET BY MOUTH DAILY 02/13/18  Yes [provider]  buPROPion (WELLBUTRIN XL) 300 MG 24 hr tablet TAKE ONE TABLET BY MOUTH DAILY 02/13/18  Yes [provider]  Calcium Carbonate-Vitamin D 600-400 MG-UNIT tablet Take 1 tablet by mouth daily.   Yes [provider]  fluticasone (FLONASE) 50 MCG/ACT nasal spray Place 1 spray into both nostrils daily as needed for allergies.    Yes [provider]  lisinopril (ZESTRIL) 20 MG tablet Take 1 tablet (20 mg total) by mouth daily. 07/22/23  Yes End, Cristal Deer, MD  metroNIDAZOLE (METROCREAM) 0.75 % cream Apply to face once to twice daily for rosacea. 04/19/22  Yes Willeen Niece, MD  nitrofurantoin, macrocrystal-monohydrate, (MACROBID) 100 MG capsule Take 1 capsule (100 mg total) by mouth daily. Patient not taking: Reported on 12/10/2023 03/21/23   Alfredo Martinez, MD  phenazopyridine (PYRIDIUM) 200 MG tablet Take 1 tablet (200 mg total) by mouth 3 (three) times daily. Patient not taking: Reported on 12/10/2023 12/02/23   Becky Augusta, NP   Chest Portable 1 View Result Date: 12/10/2023 CLINICAL DATA:  Preoperative examination. EXAM: PORTABLE CHEST 1 VIEW COMPARISON:  07/05/2021 FINDINGS: Normal heart size and pulmonary vascularity. No focal airspace disease or consolidation in the lungs. No blunting of costophrenic angles. No pneumothorax. Mediastinal contours appear intact. Degenerative changes in the spine and shoulders. IMPRESSION: No active disease. Electronically Signed   By: Burman Nieves M.D.   On: 12/10/2023 00:42   DG Femur Min 2 Views Left Result Date: 12/09/2023 CLINICAL DATA:  Status post fall. EXAM: LEFT FEMUR 2 VIEWS COMPARISON:  None Available. FINDINGS: There is acute fracture deformity extending through the neck of the proximal left femur. There is no evidence of dislocation. Soft tissues are unremarkable. IMPRESSION: Acute fracture of the proximal left femur. Electronically Signed   By: Aram Candela M.D.   On: 12/09/2023 22:58   DG Pelvis Portable Result Date: 12/09/2023 CLINICAL DATA:  Status post fall. EXAM: PORTABLE PELVIS 1-2 VIEWS COMPARISON:  None Available. FINDINGS: There is an acute fracture extending through the neck of the proximal left femur. Approximately 1/2 shaft width dorsal displacement of the distal fracture site is seen. There is no evidence of dislocation. IMPRESSION: Acute  fracture of the proximal left femur. Electronically Signed   By: Aram Candela M.D.   On: 12/09/2023 22:57   DG Knee 2 Views Left Result Date: 12/09/2023 CLINICAL DATA:  Status post fall. EXAM: LEFT KNEE - 1-2 VIEW COMPARISON:  None Available. FINDINGS: No evidence of an acute fracture or dislocation. Large medial and lateral marginal osteophytes are seen. Moderate to marked severity tricompartmental joint space narrowing is noted. Multiple approximally 1.5 cm oval shaped soft tissue calcifications are seen adjacent to the lateral aspect of the distal left femoral shaft. A small joint effusion is suspected. IMPRESSION: 1. Marked severity degenerative changes. 2. Small joint effusion. Electronically Signed   By: Aram Candela M.D.   On: 12/09/2023 22:56    All pertinent xrays, MRI, CT independently reviewed and  interpreted  Positive ROS: All other systems have been reviewed and were otherwise negative with the exception of those mentioned in the HPI and as above.  Physical Exam: General: No acute distress Cardiovascular: No pedal edema Respiratory: No cyanosis, no use of accessory musculature GI: No organomegaly, abdomen is soft and non-tender Skin: No lesions in the area of chief complaint Neurologic: Sensation intact distally Psychiatric: Patient is at baseline mood and affect Lymphatic: No axillary or cervical lymphadenopathy  MUSCULOSKELETAL:  - hip ROM deferred due to pain - skin intact - NVI distally - compartments soft  Assessment: Left femoral neck fracture  Plan: - total hip replacement is recommended for pain relief, quality of life and early mobilization - patient and family are aware of r/b/a and wish to proceed, informed consent obtained - medical optimization per primary team - surgery is planned for Sunday morning  Thank you for the consult and the opportunity to see Michelle Cisneros  N. Glee Arvin, MD [facility] 9:41 AM

## 2023-12-11 ENCOUNTER — Inpatient Hospital Stay (HOSPITAL_COMMUNITY): Payer: PPO | Admitting: Anesthesiology

## 2023-12-11 ENCOUNTER — Inpatient Hospital Stay (HOSPITAL_COMMUNITY): Payer: PPO

## 2023-12-11 ENCOUNTER — Encounter (HOSPITAL_COMMUNITY)
Admission: EM | Disposition: A | Discharge status: Home Health | Payer: Self-pay | Source: Home / Self Care | Attending: Internal Medicine

## 2023-12-11 ENCOUNTER — Encounter (HOSPITAL_COMMUNITY): Payer: Self-pay | Admitting: Internal Medicine

## 2023-12-11 ENCOUNTER — Other Ambulatory Visit: Payer: Self-pay

## 2023-12-11 DIAGNOSIS — S72002A Fracture of unspecified part of neck of left femur, initial encounter for closed fracture: Secondary | ICD-10-CM

## 2023-12-11 DIAGNOSIS — S72012A Unspecified intracapsular fracture of left femur, initial encounter for closed fracture: Secondary | ICD-10-CM | POA: Diagnosis not present

## 2023-12-11 HISTORY — PX: TOTAL HIP ARTHROPLASTY: SHX124

## 2023-12-11 LAB — CBC
HCT: 40.4 % (ref 36.0–46.0)
Hemoglobin: 13.5 g/dL (ref 12.0–15.0)
MCH: 30.6 pg (ref 26.0–34.0)
MCHC: 33.4 g/dL (ref 30.0–36.0)
MCV: 91.6 fL (ref 80.0–100.0)
Platelets: 218 10*3/uL (ref 150–400)
RBC: 4.41 MIL/uL (ref 3.87–5.11)
RDW: 12.3 % (ref 11.5–15.5)
WBC: 14.8 10*3/uL — ABNORMAL HIGH (ref 4.0–10.5)
nRBC: 0 % (ref 0.0–0.2)

## 2023-12-11 LAB — CREATININE, SERUM
Creatinine, Ser: 0.58 mg/dL (ref 0.44–1.00)
GFR, Estimated: >60 mL/min (ref >60)

## 2023-12-11 SURGERY — ARTHROPLASTY, HIP, TOTAL, ANTERIOR APPROACH
Anesthesia: General | Site: Hip | Laterality: Left

## 2023-12-11 MED ORDER — ONDANSETRON HCL 4 MG PO TABS: 4.0000 mg | ORAL_TABLET | Freq: Four times a day (QID) | ORAL | Status: DC | PRN

## 2023-12-11 MED ORDER — ENOXAPARIN SODIUM 40 MG/0.4ML IJ SOSY
40.0000 mg | PREFILLED_SYRINGE | INTRAMUSCULAR | Status: DC
  Administered 2023-12-12: 40 mg via SUBCUTANEOUS
  Filled 2023-12-11: qty 0.4

## 2023-12-11 MED ORDER — PHENYLEPHRINE 80 MCG/ML (10ML) SYRINGE FOR IV PUSH (FOR BLOOD PRESSURE SUPPORT)
PREFILLED_SYRINGE | INTRAVENOUS | Status: AC
  Filled 2023-12-11: qty 10

## 2023-12-11 MED ORDER — DEXMEDETOMIDINE HCL IN NACL 200 MCG/50ML IV SOLN
INTRAVENOUS | Status: DC | PRN
  Administered 2023-12-11 (×2): 4 ug via INTRAVENOUS

## 2023-12-11 MED ORDER — SODIUM CHLORIDE 0.9 % IR SOLN
Status: DC | PRN
  Administered 2023-12-11: 1000 mL

## 2023-12-11 MED ORDER — OXYCODONE HCL 5 MG PO TABS: 5.0000 mg | ORAL_TABLET | Freq: Once | ORAL | Status: DC | PRN

## 2023-12-11 MED ORDER — LIDOCAINE HCL (CARDIAC) PF 100 MG/5ML IV SOSY
PREFILLED_SYRINGE | INTRAVENOUS | Status: DC | PRN
  Administered 2023-12-11: 60 mg via INTRATRACHEAL

## 2023-12-11 MED ORDER — CHLORHEXIDINE GLUCONATE 0.12 % MT SOLN
OROMUCOSAL | Status: AC
  Administered 2023-12-11: 15 mL via OROMUCOSAL
  Filled 2023-12-11: qty 15

## 2023-12-11 MED ORDER — PHENYLEPHRINE HCL-NACL 20-0.9 MG/250ML-% IV SOLN
INTRAVENOUS | Status: DC | PRN
  Administered 2023-12-11: 50 ug/min via INTRAVENOUS

## 2023-12-11 MED ORDER — TRANEXAMIC ACID 1000 MG/10ML IV SOLN
2000.0000 mg | INTRAVENOUS | Status: DC
  Filled 2023-12-11: qty 20

## 2023-12-11 MED ORDER — MIDAZOLAM HCL 2 MG/2ML IJ SOLN
INTRAMUSCULAR | Status: AC
  Filled 2023-12-11: qty 2

## 2023-12-11 MED ORDER — PHENYLEPHRINE 80 MCG/ML (10ML) SYRINGE FOR IV PUSH (FOR BLOOD PRESSURE SUPPORT)
PREFILLED_SYRINGE | INTRAVENOUS | Status: DC | PRN
  Administered 2023-12-11 (×2): 160 ug via INTRAVENOUS
  Administered 2023-12-11 (×2): 80 ug via INTRAVENOUS

## 2023-12-11 MED ORDER — POVIDONE-IODINE 10 % EX SWAB: 2.0000 | Freq: Once | CUTANEOUS | Status: DC

## 2023-12-11 MED ORDER — LACTATED RINGERS IV SOLN: INTRAVENOUS | Status: DC

## 2023-12-11 MED ORDER — TRANEXAMIC ACID-NACL 1000-0.7 MG/100ML-% IV SOLN
INTRAVENOUS | Status: AC
  Filled 2023-12-11: qty 100

## 2023-12-11 MED ORDER — ADULT MULTIVITAMIN W/MINERALS CH
1.0000 | ORAL_TABLET | Freq: Every day | ORAL | Status: DC
Start: 2023-12-11 — End: 2023-12-13
  Administered 2023-12-11 – 2023-12-13 (×3): 1 via ORAL
  Filled 2023-12-11 (×3): qty 1

## 2023-12-11 MED ORDER — OXYCODONE HCL 5 MG PO TABS
10.0000 mg | ORAL_TABLET | ORAL | Status: DC | PRN
Start: 2023-12-11 — End: 2023-12-13

## 2023-12-11 MED ORDER — HYDROMORPHONE HCL 1 MG/ML IJ SOLN
INTRAMUSCULAR | Status: DC | PRN
  Administered 2023-12-11 (×3): .5 mg via INTRAVENOUS

## 2023-12-11 MED ORDER — LIDOCAINE 2% (20 MG/ML) 5 ML SYRINGE
INTRAMUSCULAR | Status: DC | PRN
  Administered 2023-12-11: 50 mg via INTRAVENOUS

## 2023-12-11 MED ORDER — SORBITOL 70 % SOLN
30.0000 mL | Freq: Every day | Status: DC | PRN
Start: 2023-12-11 — End: 2023-12-13

## 2023-12-11 MED ORDER — OXYCODONE HCL 5 MG PO TABS
5.0000 mg | ORAL_TABLET | ORAL | Status: DC | PRN
  Administered 2023-12-11 – 2023-12-13 (×5): 10 mg via ORAL
  Filled 2023-12-11 (×5): qty 2

## 2023-12-11 MED ORDER — CEFAZOLIN SODIUM-DEXTROSE 2-4 GM/100ML-% IV SOLN
INTRAVENOUS | Status: AC
  Filled 2023-12-11: qty 100

## 2023-12-11 MED ORDER — MENTHOL 3 MG MT LOZG: 1.0000 | LOZENGE | OROMUCOSAL | Status: DC | PRN

## 2023-12-11 MED ORDER — ACETAMINOPHEN 325 MG PO TABS: 325.0000 mg | ORAL_TABLET | Freq: Four times a day (QID) | ORAL | Status: DC | PRN

## 2023-12-11 MED ORDER — ONDANSETRON HCL 4 MG/2ML IJ SOLN
INTRAMUSCULAR | Status: DC | PRN
  Administered 2023-12-11: 4 mg via INTRAVENOUS

## 2023-12-11 MED ORDER — HYDROMORPHONE HCL 1 MG/ML IJ SOLN
INTRAMUSCULAR | Status: AC
  Filled 2023-12-11: qty 0.5

## 2023-12-11 MED ORDER — ALUM & MAG HYDROXIDE-SIMETH 200-200-20 MG/5ML PO SUSP
30.0000 mL | ORAL | Status: DC | PRN
Start: 2023-12-11 — End: 2023-12-13

## 2023-12-11 MED ORDER — METHOCARBAMOL 1000 MG/10ML IJ SOLN: 500.0000 mg | Freq: Four times a day (QID) | INTRAMUSCULAR | Status: DC | PRN

## 2023-12-11 MED ORDER — VANCOMYCIN HCL 1 G IV SOLR
INTRAVENOUS | Status: DC | PRN
  Administered 2023-12-11: 1000 mg via TOPICAL

## 2023-12-11 MED ORDER — ORAL CARE MOUTH RINSE: 15.0000 mL | Freq: Once | OROMUCOSAL | Status: AC

## 2023-12-11 MED ORDER — PRONTOSAN WOUND IRRIGATION OPTIME
TOPICAL | Status: DC | PRN
  Administered 2023-12-11: 1 via TOPICAL

## 2023-12-11 MED ORDER — CEFAZOLIN SODIUM-DEXTROSE 2-4 GM/100ML-% IV SOLN
2.0000 g | Freq: Four times a day (QID) | INTRAVENOUS | Status: AC
  Administered 2023-12-11 (×3): 2 g via INTRAVENOUS
  Filled 2023-12-11 (×3): qty 100

## 2023-12-11 MED ORDER — PROPOFOL 1000 MG/100ML IV EMUL
INTRAVENOUS | Status: AC
  Filled 2023-12-11: qty 100

## 2023-12-11 MED ORDER — PROPOFOL 10 MG/ML IV BOLUS
INTRAVENOUS | Status: DC | PRN
  Administered 2023-12-11: 110 mg via INTRAVENOUS

## 2023-12-11 MED ORDER — TRANEXAMIC ACID-NACL 1000-0.7 MG/100ML-% IV SOLN
1000.0000 mg | Freq: Once | INTRAVENOUS | Status: AC
  Administered 2023-12-11: 1000 mg via INTRAVENOUS
  Filled 2023-12-11: qty 100

## 2023-12-11 MED ORDER — TRANEXAMIC ACID 1000 MG/10ML IV SOLN
INTRAVENOUS | Status: DC | PRN
  Administered 2023-12-11: 2000 mg via TOPICAL

## 2023-12-11 MED ORDER — DOCUSATE SODIUM 100 MG PO CAPS
100.0000 mg | ORAL_CAPSULE | Freq: Two times a day (BID) | ORAL | Status: DC
  Administered 2023-12-11 – 2023-12-13 (×4): 100 mg via ORAL
  Filled 2023-12-11 (×5): qty 1

## 2023-12-11 MED ORDER — CHLORHEXIDINE GLUCONATE 0.12 % MT SOLN: 15.0000 mL | Freq: Once | OROMUCOSAL | Status: AC

## 2023-12-11 MED ORDER — TRANEXAMIC ACID-NACL 1000-0.7 MG/100ML-% IV SOLN
1000.0000 mg | INTRAVENOUS | Status: AC
Start: 2023-12-11 — End: 2023-12-11
  Administered 2023-12-11: 1000 mg via INTRAVENOUS

## 2023-12-11 MED ORDER — ONDANSETRON HCL 4 MG/2ML IJ SOLN: 4.0000 mg | Freq: Once | INTRAMUSCULAR | Status: DC | PRN

## 2023-12-11 MED ORDER — CEFAZOLIN SODIUM-DEXTROSE 2-4 GM/100ML-% IV SOLN
2.0000 g | INTRAVENOUS | Status: AC
  Administered 2023-12-11: 2 g via INTRAVENOUS

## 2023-12-11 MED ORDER — VANCOMYCIN HCL 1000 MG IV SOLR
INTRAVENOUS | Status: AC
  Filled 2023-12-11: qty 20

## 2023-12-11 MED ORDER — OXYCODONE HCL 5 MG/5ML PO SOLN: 5.0000 mg | Freq: Once | ORAL | Status: DC | PRN

## 2023-12-11 MED ORDER — FENTANYL CITRATE (PF) 100 MCG/2ML IJ SOLN: 25.0000 ug | INTRAMUSCULAR | Status: DC | PRN

## 2023-12-11 MED ORDER — MAGNESIUM CITRATE PO SOLN: 1.0000 | Freq: Once | ORAL | Status: DC | PRN

## 2023-12-11 MED ORDER — ONDANSETRON HCL 4 MG/2ML IJ SOLN
4.0000 mg | Freq: Four times a day (QID) | INTRAMUSCULAR | Status: DC | PRN
  Administered 2023-12-11: 4 mg via INTRAVENOUS
  Filled 2023-12-11: qty 2

## 2023-12-11 MED ORDER — BUPIVACAINE-MELOXICAM ER 400-12 MG/14ML IJ SOLN
INTRAMUSCULAR | Status: AC
  Filled 2023-12-11: qty 1

## 2023-12-11 MED ORDER — POLYETHYLENE GLYCOL 3350 17 G PO PACK: 17.0000 g | PACK | Freq: Every day | ORAL | Status: DC | PRN

## 2023-12-11 MED ORDER — ACETAMINOPHEN 500 MG PO TABS: 1000.0000 mg | ORAL_TABLET | Freq: Four times a day (QID) | ORAL | Status: DC

## 2023-12-11 MED ORDER — PROPOFOL 10 MG/ML IV BOLUS
INTRAVENOUS | Status: AC
  Filled 2023-12-11: qty 20

## 2023-12-11 MED ORDER — PHENOL 1.4 % MT LIQD: 1.0000 | OROMUCOSAL | Status: DC | PRN

## 2023-12-11 MED ORDER — DEXMEDETOMIDINE HCL IN NACL 80 MCG/20ML IV SOLN
INTRAVENOUS | Status: AC
  Filled 2023-12-11: qty 20

## 2023-12-11 MED ORDER — LIDOCAINE 2% (20 MG/ML) 5 ML SYRINGE
INTRAMUSCULAR | Status: AC
  Filled 2023-12-11: qty 5

## 2023-12-11 MED ORDER — MIDAZOLAM HCL 2 MG/2ML IJ SOLN
INTRAMUSCULAR | Status: DC | PRN
  Administered 2023-12-11: 2 mg via INTRAVENOUS

## 2023-12-11 MED ORDER — ACETAMINOPHEN 10 MG/ML IV SOLN: 1000.0000 mg | Freq: Once | INTRAVENOUS | Status: DC | PRN

## 2023-12-11 MED ORDER — BUPIVACAINE-MELOXICAM ER 400-12 MG/14ML IJ SOLN
INTRAMUSCULAR | Status: DC | PRN
  Administered 2023-12-11: 400 mg

## 2023-12-11 MED ORDER — 0.9 % SODIUM CHLORIDE (POUR BTL) OPTIME
TOPICAL | Status: DC | PRN
  Administered 2023-12-11: 1000 mL

## 2023-12-11 MED ORDER — DEXAMETHASONE SODIUM PHOSPHATE 10 MG/ML IJ SOLN
INTRAMUSCULAR | Status: DC | PRN
  Administered 2023-12-11: 10 mg via INTRAVENOUS

## 2023-12-11 MED ORDER — HYDROMORPHONE HCL 1 MG/ML IJ SOLN: 0.5000 mg | INTRAMUSCULAR | Status: DC | PRN

## 2023-12-11 MED ORDER — METHOCARBAMOL 500 MG PO TABS
500.0000 mg | ORAL_TABLET | Freq: Four times a day (QID) | ORAL | Status: DC | PRN
  Administered 2023-12-11 – 2023-12-12 (×2): 500 mg via ORAL
  Filled 2023-12-11 (×2): qty 1

## 2023-12-11 SURGICAL SUPPLY — 56 items
BAG COUNTER SPONGE SURGICOUNT (BAG) ×1 IMPLANT
BAG DECANTER FOR FLEXI CONT (MISCELLANEOUS) ×1 IMPLANT
BLADE SAG 18X100X1.27 (BLADE) ×1 IMPLANT
COVER PERINEAL POST (MISCELLANEOUS) ×1 IMPLANT
COVER SURGICAL LIGHT HANDLE (MISCELLANEOUS) ×1 IMPLANT
DERMABOND ADVANCED .7 DNX12 (GAUZE/BANDAGES/DRESSINGS) IMPLANT
DRAPE C-ARM 42X72 X-RAY (DRAPES) ×1 IMPLANT
DRAPE POUCH INSTRU U-SHP 10X18 (DRAPES) ×1 IMPLANT
DRAPE STERI IOBAN 125X83 (DRAPES) ×1 IMPLANT
DRAPE U-SHAPE 47X51 STRL (DRAPES) ×2 IMPLANT
DRSG AQUACEL AG ADV 3.5X10 (GAUZE/BANDAGES/DRESSINGS) ×1 IMPLANT
DURAPREP 26ML APPLICATOR (WOUND CARE) ×2 IMPLANT
ELECT BLADE 4.0 EZ CLEAN MEGAD (MISCELLANEOUS) ×1
ELECT REM PT RETURN 9FT ADLT (ELECTROSURGICAL) ×1
ELECTRODE BLDE 4.0 EZ CLN MEGD (MISCELLANEOUS) ×1 IMPLANT
ELECTRODE REM PT RTRN 9FT ADLT (ELECTROSURGICAL) ×1 IMPLANT
GLOVE BIOGEL PI IND STRL 7.0 (GLOVE) ×2 IMPLANT
GLOVE BIOGEL PI IND STRL 7.5 (GLOVE) ×5 IMPLANT
GLOVE ECLIPSE 7.0 STRL STRAW (GLOVE) ×2 IMPLANT
GLOVE SKINSENSE STRL SZ7.5 (GLOVE) ×1 IMPLANT
GLOVE SURG SS PI 8.0 STRL IVOR (GLOVE) IMPLANT
GLOVE SURG SYN 7.5 E (GLOVE) ×2 IMPLANT
GLOVE SURG SYN 7.5 PF PI (GLOVE) ×2 IMPLANT
GLOVE SURG UNDER POLY LF SZ7 (GLOVE) ×3 IMPLANT
GLOVE SURG UNDER POLY LF SZ7.5 (GLOVE) ×2 IMPLANT
GOWN STRL REUS W/ TWL LRG LVL3 (GOWN DISPOSABLE) IMPLANT
GOWN STRL REUS W/ TWL XL LVL3 (GOWN DISPOSABLE) ×1 IMPLANT
GOWN STRL SURGICAL XL XLNG (GOWN DISPOSABLE) ×1 IMPLANT
GOWN TOGA ZIPPER T7+ PEEL AWAY (MISCELLANEOUS) ×2 IMPLANT
HEAD CERAMIC DELTA 36 PLUS 1.5 (Hips) IMPLANT
HOOD PEEL AWAY T7 (MISCELLANEOUS) ×1 IMPLANT
IV NS IRRIG 3000ML ARTHROMATIC (IV SOLUTION) ×1 IMPLANT
KIT BASIN OR (CUSTOM PROCEDURE TRAY) ×1 IMPLANT
LINER NEUTRAL 52X36MM PLUS 4 (Liner) IMPLANT
MARKER SKIN DUAL TIP RULER LAB (MISCELLANEOUS) ×1 IMPLANT
NDL SPNL 18GX3.5 QUINCKE PK (NEEDLE) ×1 IMPLANT
NEEDLE SPNL 18GX3.5 QUINCKE PK (NEEDLE) ×1 IMPLANT
PACK TOTAL JOINT (CUSTOM PROCEDURE TRAY) ×1 IMPLANT
PACK UNIVERSAL I (CUSTOM PROCEDURE TRAY) ×1 IMPLANT
PIN SECTOR W/GRIP ACE CUP 52MM (Hips) IMPLANT
SCREW 6.5MMX30MM (Screw) IMPLANT
SET HNDPC FAN SPRY TIP SCT (DISPOSABLE) ×1 IMPLANT
SOLUTION PRONTOSAN WOUND 350ML (IRRIGATION / IRRIGATOR) ×1 IMPLANT
STAPLER VISISTAT 35W (STAPLE) IMPLANT
STEM FEM ACTIS STD SZ7 (Nail) IMPLANT
SUT ETHIBOND 2 V 37 (SUTURE) ×1 IMPLANT
SUT ETHILON 2 0 PSLX (SUTURE) IMPLANT
SUT VIC AB 0 CT1 27XBRD ANBCTR (SUTURE) ×1 IMPLANT
SUT VIC AB 1 CTX36XBRD ANBCTR (SUTURE) ×1 IMPLANT
SUT VIC AB 2-0 CT1 TAPERPNT 27 (SUTURE) ×2 IMPLANT
SYR 50ML LL SCALE MARK (SYRINGE) ×1 IMPLANT
TOWEL GREEN STERILE (TOWEL DISPOSABLE) ×1 IMPLANT
TRAY CATH INTERMITTENT SS 16FR (CATHETERS) IMPLANT
TRAY FOLEY W/BAG SLVR 16FR ST (SET/KITS/TRAYS/PACK) IMPLANT
TUBE SUCT ARGYLE STRL (TUBING) ×1 IMPLANT
YANKAUER SUCT BULB TIP NO VENT (SUCTIONS) ×1 IMPLANT

## 2023-12-11 NOTE — Op Note (Signed)
TOTAL HIP ARTHROPLASTY ANTERIOR APPROACH  Procedure Note Michelle Cisneros   [id number]  Pre-op Diagnosis: Left femoral neck Fracture     Post-op Diagnosis: same  Operative Findings Acute femoral neck fracture   Operative Procedures  1. Total hip replacement; Left hip; uncemented [id number]   Surgeon: Gershon Mussel, M.D.  Assist: None   Anesthesia: general  Prosthesis: Depuy Acetabulum: Pinnacle 52 mm Femur: Actis 7 STD Head: 36 mm size: +1.5 Liner: +4 Bearing Type: ceramic/poly  Total Hip Arthroplasty (Anterior Approach) Op Note:  After informed consent was obtained and the operative extremity marked in the holding area, the patient was brought back to the operating room and placed supine on the HANA table. Next, the operative extremity was prepped and draped in normal sterile fashion. Surgical timeout occurred verifying patient identification, surgical site, surgical procedure and administration of antibiotics.  A 10 cm longitudinal incision was made starting from 2 fingerbreadths lateral and inferior to the ASIS towards the lateral aspect of the patella.  A Hueter approach to the hip was performed, using the interval between tensor fascia lata and sartorius.  Dissection was carried bluntly down onto the anterior hip capsule. The lateral femoral circumflex vessels were identified and coagulated. A capsulotomy was performed and the capsular flaps tagged for later repair.  Fracture hematoma was evacuated.  The neck osteotomy was performed 1 fingerbreadth above the lesser trochanter. The femoral head and the neck remnant were removed, the acetabular rim was cleared of soft tissue.  There were signs of degenerative joint disease.  Sequential reaming was performed under fluoroscopic guidance down to the floor of the cotyloid fossa. We reamed to a size 51 mm, and then impacted the acetabular shell. A 25 mm cancellous screw was placed to secure the shell.  The liner was then placed after  irrigation and attention turned to the femur.  After placing the femoral hook, the leg was taken to externally rotated, extended and adducted position taking care to perform soft tissue releases to allow for adequate mobilization of the femur. Soft tissue was cleared from the shoulder of the greater trochanter and the hook elevator used to improve exposure of the proximal femur. Sequential broaching performed up to a size 7. Trial neck and head were placed. The leg was brought back up to neutral and the construct reduced.  The position and sizing of components, offset and leg lengths were checked using fluoroscopy. Stability of the construct was checked in 45 degrees of hip extension and 90 degrees of external rotation without any subluxation, shuck or impingement of prosthesis. We dislocated the prosthesis, dropped the leg back into position, removed trial components, and irrigated copiously. The final stem and head was then placed, the leg brought back up, the system reduced and fluoroscopy used to verify positioning.  Antibiotic irrigation was placed in the surgical wound.   We irrigated, obtained hemostasis and closed the capsule using #2 ethibond suture.  A topical mixture of 0.25% bupivacaine and meloxicam was placed deep to the fascia.  One gram of vancomycin powder was placed in the surgical bed.   One gram of topical tranexamic acid was injected into the joint.  The fascia was closed with #1 vicryl plus, the deep fat layer was closed with 0 vicryl, the subcutaneous layers closed with 2.0 Vicryl Plus and the skin closed with 2.0 nylon and dermabond. A sterile dressing was applied. The patient was awakened in the operating room and taken to recovery in stable condition.  All sponge,  needle, and instrument counts were correct at the end of the case.   Position: supine  Complications: see description of procedure.  Time Out: performed   Drains/Packing: none  Estimated blood loss: see anesthesia  record  Returned to Recovery Room: in good condition.   Antibiotics: yes   Mechanical VTE (DVT) Prophylaxis: sequential compression devices, TED thigh-high  Chemical VTE (DVT) Prophylaxis: lovenox   Fluid Replacement: see anesthesia record  Specimens Removed: 1 to pathology   Sponge and Instrument Count Correct? yes   PACU: portable radiograph - low AP   Plan/RTC: Return in 2 weeks for staple removal. Weight Bearing/Load Lower Extremity: full  Hip precautions: none Suture Removal: 2 weeks   N. Glee Arvin, MD [facility] 9:34 AM   Implant Name Type Inv. Item Serial No. Manufacturer Lot No. LRB No. Used Action  PIN SECTOR W/GRIP ACE CUP - [id number] Hips PIN SECTOR W/GRIP ACE CUP  [employer] [id number] Left 1 Implanted  LINER NEUTRAL 52X36MM PLUS 4 - [id number] Liner LINER NEUTRAL 52X36MM PLUS 4  [employer]PU[employer] [id number] Left 1 Implanted  SCREW 6.5MMX30MM - [id number] Screw SCREW 6.5MMX30MM  [employer]PU[employer] [id number] Left 1 Implanted  STEM FEM ACTIS STD SZ7 - [id number] Nail STEM FEM ACTIS STD SZ7  [employer]PU[employer] [id number] Left 1 Implanted  HEAD CERAMIC DELTA 36 PLUS 1.5 - [id number] Hips HEAD CERAMIC DELTA 36 PLUS 1.5  [employer] [id number] Left 1 Implanted

## 2023-12-11 NOTE — Plan of Care (Signed)
  Problem: Education: Goal: Knowledge of General Education information will improve Description: Including pain rating scale, medication(s)/side effects and non-pharmacologic comfort measures Outcome: Progressing   Problem: Clinical Measurements: Goal: Respiratory complications will improve Outcome: Progressing   Problem: Coping: Goal: Level of anxiety will decrease Outcome: Progressing   Problem: Pain Management: Goal: General experience of comfort will improve Outcome: Progressing   Problem: Safety: Goal: Ability to remain free from injury will improve Outcome: Progressing

## 2023-12-11 NOTE — Progress Notes (Signed)
Initial Nutrition Assessment  DOCUMENTATION CODES:   Not applicable  INTERVENTION:  Liberalize diet Multivitamin/minerals   NUTRITION DIAGNOSIS:   Increased nutrient needs related to post-op healing as evidenced by estimated needs.    GOAL:   Patient will meet greater than or equal to 90% of their needs    MONITOR:   PO intake  REASON FOR ASSESSMENT:   Consult Hip fracture protocol  ASSESSMENT:   67 y.o. F, presented form home after having ground fall to left side, complaints of left side hip pain. Admitted with closed fracture of proximal end of left femur. PMH; HTN, HLD, pSVT, chronic hydrocephalus, lumbar spondylosis, OSA on CPAP. Review of EMR revealed; weight stable. Independent feeding ability, no chewing swallowing concerns.  12/22- total hip arthroplasty  Admit weight: 74.4 kg  Weight history: 12/11/23 74.4 kg  12/02/23 74.4 kg  05/26/23 73.5 kg      Average Meal Intake: No current documentation  Nutritionally Relevant Medications: Scheduled Meds:  atorvastatin  10 mg Oral Daily   buPROPion  300 mg Oral Daily    Labs Reviewed     NUTRITION - FOCUSED PHYSICAL EXAM:  Deferred   Diet Order:   Diet Order             Diet regular Room service appropriate? Yes; Fluid consistency: Thin  Diet effective now                   EDUCATION NEEDS:   No education needs have been identified at this time  Skin:  Skin Assessment: Skin Integrity Issues: Skin Integrity Issues:: Incisions Incisions: L hip  Last BM:  PTA  Height:   Ht Readings from Last 1 Encounters:  12/11/23 5' 4.02" (1.626 m)    Weight:   Wt Readings from Last 1 Encounters:  12/11/23 74.4 kg    Ideal Body Weight:     BMI:  Body mass index is 28.14 kg/m.  Estimated Nutritional Needs:   Kcal:  2240-2610 kcal/d  Protein:  100-115 g/d  Fluid:  77ml/kcal    Jamelle Haring RDN, LDN Clinical Dietitian  Pleas see Amion for contact information

## 2023-12-11 NOTE — Progress Notes (Signed)
PROGRESS NOTE  Michelle Cisneros WUJ:[id number] DOB: [birth year] DOA: 12/09/2023 PCP: Jerl Mina, MD   LOS: 2 days   Brief Narrative / Interim history: 67 year old female with history of HTN, HLD, paroxysmal SVT, chronic hydrocephalus, lumbar spondylosis, OSA on CPAP comes into the hospital with left hip pain after a fall.  This happened while she was [place] and stepped off a curb losing her balance.  Did not hit her head, no loss of consciousness.  She was found to have a hip fracture and admitted to the hospital  Subjective / 24h Interval events: Nervous this morning prior to surgery.  Otherwise no complaints  Assesement and Plan: Principal Problem:   Closed subcapital fracture of neck of left femur, initial encounter (HCC) Active Problems:   Essential hypertension   Hyperlipidemia   Depression   OSA (obstructive sleep apnea)  Principal problem Acute left proximal femur fracture-orthopedic surgery consulted, status post THA this morning with Dr. Roda Shutters.  Mobilize.  Per PT, DVT prophylaxis per Ortho surgery -Continue pain control  Active problems Essential hypertension-remains normotensive, hold home lisinopril perioperatively  Hyperlipidemia-continue atorvastatin  Depression-continue Wellbutrin  OSA-recently started using CPAP about a month ago, she declined to use it use it while here  Scheduled Meds:  [MAR Hold] atorvastatin  10 mg Oral Daily   [MAR Hold] buPROPion  300 mg Oral Daily   [MAR Hold] Chlorhexidine Gluconate Cloth  6 each Topical Daily   [MAR Hold] mupirocin ointment  1 Application Nasal BID   povidone-iodine  2 Application Topical Once   tranexamic acid (CYKLOKAPRON) 2,000 mg in sodium chloride 0.9 % 50 mL Topical Application  2,000 mg Topical To OR   Continuous Infusions:  acetaminophen     lactated ringers 10 mL/hr at 12/11/23 0737   tranexamic acid     PRN Meds:.acetaminophen, [MAR Hold] acetaminophen, fentaNYL (SUBLIMAZE) injection,  [MAR Hold] HYDROcodone-acetaminophen, [MAR Hold]  morphine injection, [MAR Hold] ondansetron (ZOFRAN) IV, ondansetron (ZOFRAN) IV, oxyCODONE **OR** oxyCODONE, [MAR Hold] senna-docusate, tranexamic acid  Current Outpatient Medications  Medication Instructions   aspirin EC 81 mg, Oral, Daily   atorvastatin (LIPITOR) 10 MG tablet TAKE ONE TABLET BY MOUTH DAILY   buPROPion (WELLBUTRIN XL) 300 MG 24 hr tablet TAKE ONE TABLET BY MOUTH DAILY   Calcium Carbonate-Vitamin D 600-400 MG-UNIT tablet 1 tablet, Daily   fluticasone (FLONASE) 50 MCG/ACT nasal spray 1 spray, Daily PRN   lisinopril (ZESTRIL) 20 mg, Oral, Daily   metroNIDAZOLE (METROCREAM) 0.75 % cream Apply to face once to twice daily for rosacea.   nitrofurantoin (macrocrystal-monohydrate) (MACROBID) 100 mg, Oral, Daily   phenazopyridine (PYRIDIUM) 200 mg, Oral, 3 times daily    Diet Orders (From admission, onward)     Start     Ordered   12/12/23 0000  Diet NPO time specified Except for: Sips with Meds  Diet effective midnight       Question:  Except for  Answer:  Sips with Meds   12/11/23 0716   12/11/23 0001  Diet NPO time specified  Diet effective midnight        12/10/23 0910            DVT prophylaxis: SCDs Start: 12/10/23 0015   Lab Results  Component Value Date   PLT 233 12/10/2023      Code Status: Full Code  Family Communication: Daughter at bedside  Status is: Inpatient Remains inpatient appropriate because: Severity of illness  Level of care: Med-Surg  Consultants:  Orthopedic surgery  Objective: Vitals:   12/11/23 0531 12/11/23 0714 12/11/23 0945 12/11/23 1000  BP: 126/84  (!) 106/57 125/72  Pulse: 71  84 85  Resp: 19  14 16   Temp: 98.2 F (36.8 C)  99.3 F (37.4 C)   TempSrc: Oral     SpO2: 95%  98% 95%  Weight:  74.4 kg    Height:  5' 4.02" (1.626 m)      Intake/Output Summary (Last 24 hours) at 12/11/2023 1007 Last data filed at 12/11/2023 0950 Gross per 24 hour  Intake 409.67 ml   Output 3305 ml  Net -2895.33 ml   Wt Readings from Last 3 Encounters:  12/11/23 74.4 kg  12/02/23 74.4 kg  05/26/23 73.5 kg    Examination:  Constitutional: NAD Eyes: lids and conjunctivae normal, no scleral icterus ENMT: mmm Neck: normal, supple Respiratory: clear to auscultation bilaterally, no wheezing, no crackles. Normal respiratory effort.  Cardiovascular: Regular rate and rhythm, no murmurs / rubs / gallops. No LE edema. Abdomen: soft, no distention, no tenderness. Bowel sounds positive.   Data Reviewed: I have independently reviewed following labs and imaging studies   CBC Recent Labs  Lab 12/09/23 2221 12/10/23 0607  WBC 13.4* 10.4  HGB 13.7 13.1  HCT 40.9 38.2  PLT 255 233  MCV 90.9 89.0  MCH 30.4 30.5  MCHC 33.5 34.3  RDW 12.3 12.2  LYMPHSABS 0.6*  --   MONOABS 0.5  --   EOSABS 0.0  --   BASOSABS 0.0  --     Recent Labs  Lab 12/09/23 2221 12/10/23 0607  NA 134* 136  K 3.9 3.9  CL 104 102  CO2 20* 25  GLUCOSE 113* 152*  BUN 12 11  CREATININE 0.65 0.86  CALCIUM 9.1 9.4  AST 46*  --   ALT 91*  --   ALKPHOS 71  --   BILITOT 0.9  --   ALBUMIN 3.8  --     ------------------------------------------------------------------------------------------------------------------ No results for input(s): "CHOL", "HDL", "LDLCALC", "TRIG", "CHOLHDL", "LDLDIRECT" in the last 72 hours.  No results found for: "HGBA1C" ------------------------------------------------------------------------------------------------------------------ No results for input(s): "TSH", "T4TOTAL", "T3FREE", "THYROIDAB" in the last 72 hours.  Invalid input(s): "FREET3"  Cardiac Enzymes No results for input(s): "CKMB", "TROPONINI", "MYOGLOBIN" in the last 168 hours.  Invalid input(s): "CK" ------------------------------------------------------------------------------------------------------------------ No results found for: "BNP"  CBG: No results for input(s): "GLUCAP" in the  last 168 hours.  Recent Results (from the past 240 hours)  Urine Culture     Status: None   Collection Time: 12/02/23  4:52 PM   Specimen: Urine, Random  Result Value Ref Range Status   Specimen Description   Final    URINE, RANDOM Performed at [facility], [address] [place], [place] [address]    Special Requests   Final    NONE Reflexed from [id number] Performed at [facility], [address], [place], [place] [address]    Culture   Final    NO GROWTH Performed at [facility], [address], [place], [place] [address]    Report Status 12/04/2023 FINAL  Final  Surgical PCR screen     Status: None   Collection Time: 12/10/23  5:33 AM   Specimen: Nasal Mucosa; Nasal Swab  Result Value Ref Range Status   MRSA, PCR NEGATIVE NEGATIVE Final   Staphylococcus aureus NEGATIVE NEGATIVE Final    Comment: (NOTE) The Xpert SA Assay (FDA approved for NASAL specimens in patients 22  years of age and older), is one component of a comprehensive surveillance program. It is not intended to diagnose infection nor to guide or monitor treatment. Performed at [facility], [address], [place], [place] [address]      Radiology Studies: DG C-Arm 1-60 Min-No Report Result Date: 12/11/2023 Fluoroscopy was utilized by the requesting physician.  No radiographic interpretation.   DG C-Arm 1-60 Min-No Report Result Date: 12/11/2023 Fluoroscopy was utilized by the requesting physician.  No radiographic interpretation.     Pamella Pert, MD, PhD [facility]  Between 7 am - 7 pm I am available, please contact me via Amion (for emergencies) or Securechat (non urgent messages)  Between 7 pm - 7 am I am not available, please contact night coverage MD/APP via Amion

## 2023-12-11 NOTE — Anesthesia Preprocedure Evaluation (Signed)
Anesthesia Evaluation  Patient identified by MRN, date of birth, ID band Patient awake    Reviewed: Allergy & Precautions, NPO status , Patient's Chart, lab work & pertinent test results, reviewed documented beta blocker date and time   History of Anesthesia Complications Negative for: history of anesthetic complications  Airway Mallampati: II  TM Distance: >3 FB     Dental no notable dental hx.    Pulmonary sleep apnea , neg COPD, former smoker   breath sounds clear to auscultation       Cardiovascular hypertension, + DOE  (-) CAD, (-) Past MI and (-) Cardiac Stents  Rhythm:Regular Rate:Normal     Neuro/Psych neg Seizures PSYCHIATRIC DISORDERS  Depression    Chronic hydrocephalus with severe dilation of lateral ventricles  Neuromuscular disease    GI/Hepatic ,neg GERD  ,,(+) neg Cirrhosis        Endo/Other    Renal/GU Renal disease     Musculoskeletal  (+) Arthritis ,    Abdominal   Peds  Hematology   Anesthesia Other Findings   Reproductive/Obstetrics                              Anesthesia Physical Anesthesia Plan  ASA: 2  Anesthesia Plan: General   Post-op Pain Management:    Induction: Intravenous  PONV Risk Score and Plan: 2  Airway Management Planned: LMA  Additional Equipment:   Intra-op Plan:   Post-operative Plan: Extubation in OR  Informed Consent: I have reviewed the patients History and Physical, chart, labs and discussed the procedure including the risks, benefits and alternatives for the proposed anesthesia with the patient or authorized representative who has indicated his/her understanding and acceptance.     Dental advisory given  Plan Discussed with: CRNA  Anesthesia Plan Comments: (Discussed anesthetic considerations with patient. She has evidence of chronic hydrocephalus on fairly recent MRI brain with severe enlargement of the lateral  ventricles. This has uncertain significance regarding the risk of complication from dural puncture for SAB. She is also concerned about increased pain with rolling to allow spinal placement. She otherwise is healthy from a cardiopulmonary standpoint and is standard risk for general anesthesia. Will proceed with GA/LMA. )         Anesthesia Quick Evaluation

## 2023-12-11 NOTE — Anesthesia Postprocedure Evaluation (Signed)
Anesthesia Post Note  Patient: Michelle Cisneros  Procedure(s) Performed: TOTAL HIP ARTHROPLASTY ANTERIOR APPROACH (Left: Hip)     Patient location during evaluation: PACU Anesthesia Type: General Level of consciousness: awake and alert Pain management: pain level controlled Vital Signs Assessment: post-procedure vital signs reviewed and stable Respiratory status: spontaneous breathing, nonlabored ventilation, respiratory function stable and patient connected to nasal cannula oxygen Cardiovascular status: blood pressure returned to baseline and stable Postop Assessment: no apparent nausea or vomiting Anesthetic complications: no   There were no known notable events for this encounter.  Last Vitals:  Vitals:   12/11/23 1015 12/11/23 1045  BP: 117/64 (!) 141/78  Pulse: 78   Resp: 14   Temp: 36.8 C   SpO2: 93% 91%    Last Pain:  Vitals:   12/11/23 1015  TempSrc:   PainSc: 0-No pain                 Mariann Barter

## 2023-12-11 NOTE — Progress Notes (Signed)
Pt off the floor to OR.

## 2023-12-11 NOTE — H&P (Signed)
PREOPERATIVE H&P  Chief Complaint: Left Hip Fracture  HPI: Michelle Cisneros is a 67 y.o. female who presents for surgical treatment of Left Hip Fracture.  She denies any changes in medical history.  Past Surgical History:  Procedure Laterality Date   ABDOMINAL HYSTERECTOMY     COLONOSCOPY WITH PROPOFOL N/A 05/26/2020   Procedure: COLONOSCOPY WITH PROPOFOL;  Surgeon: Toledo, Boykin Nearing, MD;  Location: [facility] ENDOSCOPY;  Service: Gastroenterology;  Laterality: N/A;   INCONTINENCE SURGERY     OOPHORECTOMY     Social History   Socioeconomic History   Marital status: Married    Spouse name: Not on file   Number of children: Not on file   Years of education: Not on file   Highest education level: Not on file  Occupational History   Not on file  Tobacco Use   Smoking status: Former    Current packs/day: 0.00    Average packs/day: 1 pack/day for 30.0 years (30.0 ttl pk-yrs)    Types: Cigarettes    Start date: 10/03/1957    Quit date: 10/04/1987    Years since quitting: 36.2    Passive exposure: Past   Smokeless tobacco: Never  Vaping Use   Vaping status: Never Used  Substance and Sexual Activity   Alcohol use: Yes    Comment: occassional; once glass of wine   Drug use: Not Currently   Sexual activity: Not on file  Other Topics Concern   Not on file  Social History Narrative   Not on file   Social Drivers of Health   Financial Resource Strain: Medium Risk (03/17/2023)   Received from [facility], [facility]   Overall Financial Resource Strain (CARDIA)    Difficulty of Paying Living Expenses: Somewhat hard  Food Insecurity: No Food Insecurity (12/10/2023)   Hunger Vital Sign    Worried About Running Out of Food in the Last Year: Never true    Ran Out of Food in the Last Year: Never true  Transportation Needs: No Transportation Needs (12/10/2023)   PRAPARE - Administrator, Civil Service (Medical): No    Lack of  Transportation (Non-Medical): No  Physical Activity: Not on file  Stress: Not on file  Social Connections: Not on file   Family History  Problem Relation Age of Onset   Cancer Father        laryngeal   Atrial fibrillation Father    Stroke Mother    Breast cancer Neg Hx    Bladder Cancer Neg Hx    Kidney cancer Neg Hx    No Known Allergies Prior to Admission medications   Medication Sig Start Date End Date Taking? Authorizing Provider  aspirin EC 81 MG tablet Take 1 tablet (81 mg total) by mouth daily. 04/13/21  Yes End, Cristal Deer, MD  atorvastatin (LIPITOR) 10 MG tablet TAKE ONE TABLET BY MOUTH DAILY 02/13/18  Yes [provider]  buPROPion (WELLBUTRIN XL) 300 MG 24 hr tablet TAKE ONE TABLET BY MOUTH DAILY 02/13/18  Yes [provider]  Calcium Carbonate-Vitamin D 600-400 MG-UNIT tablet Take 1 tablet by mouth daily.   Yes [provider]  fluticasone (FLONASE) 50 MCG/ACT nasal spray Place 1 spray into both nostrils daily as needed for allergies.   Yes [provider]  lisinopril (ZESTRIL) 20 MG tablet Take 1 tablet (20 mg total) by mouth daily. 07/22/23  Yes End, Cristal Deer, MD  metroNIDAZOLE (METROCREAM) 0.75 % cream Apply to face  once to twice daily for rosacea. 04/19/22  Yes Willeen Niece, MD  nitrofurantoin, macrocrystal-monohydrate, (MACROBID) 100 MG capsule Take 1 capsule (100 mg total) by mouth daily. Patient not taking: Reported on 12/10/2023 03/21/23   Alfredo Martinez, MD  phenazopyridine (PYRIDIUM) 200 MG tablet Take 1 tablet (200 mg total) by mouth 3 (three) times daily. Patient not taking: Reported on 12/10/2023 12/02/23   Becky Augusta, NP     Positive ROS: All other systems have been reviewed and were otherwise negative with the exception of those mentioned in the HPI and as above.  Physical Exam: General: Alert, no acute distress Cardiovascular: No pedal edema Respiratory: No cyanosis, no use of accessory musculature GI: abdomen  soft Skin: No lesions in the area of chief complaint Neurologic: Sensation intact distally Psychiatric: Patient is competent for consent with normal mood and affect Lymphatic: no lymphedema  MUSCULOSKELETAL: exam stable  Assessment: Left Hip Fracture  Plan: Plan for Procedure(s): TOTAL HIP ARTHROPLASTY ANTERIOR APPROACH  The risks benefits and alternatives were discussed with the patient including but not limited to the risks of nonoperative treatment, versus surgical intervention including infection, bleeding, nerve injury,  blood clots, cardiopulmonary complications, morbidity, mortality, among others, and they were willing to proceed.   Glee Arvin, MD 12/11/2023 6:35 AM

## 2023-12-11 NOTE — Transfer of Care (Signed)
Immediate Anesthesia Transfer of Care Note  Patient: Michelle Cisneros  Procedure(s) Performed: TOTAL HIP ARTHROPLASTY ANTERIOR APPROACH (Left: Hip)  Patient Location: PACU  Anesthesia Type:General  Level of Consciousness: drowsy  Airway & Oxygen Therapy: Patient Spontanous Breathing and Patient connected to face mask oxygen  Post-op Assessment: Report given to RN and Post -op Vital signs reviewed and stable  Post vital signs: Reviewed and stable  Last Vitals:  Vitals Value Taken Time  BP 106/57 12/11/23 0945  Temp    Pulse 85 12/11/23 0950  Resp 13 12/11/23 0950  SpO2 98 % 12/11/23 0950  Vitals shown include unfiled device data.  Last Pain:  Vitals:   12/11/23 0531  TempSrc: Oral  PainSc:       Patients Stated Pain Goal: 2 (12/10/23 2144)  Complications: There were no known notable events for this encounter.

## 2023-12-11 NOTE — Discharge Instructions (Signed)
INSTRUCTIONS AFTER JOINT REPLACEMENT   Remove items at home which could result in a fall. This includes throw rugs or furniture in walking pathways ICE to the affected joint every three hours while awake for 30 minutes at a time, for at least the first 3-5 days, and then as needed for pain and swelling.  Continue to use ice for pain and swelling. You may notice swelling that will progress down to the foot and ankle.  This is normal after surgery.  Elevate your leg when you are not up walking on it.   Continue to use the breathing machine you got in the hospital (incentive spirometer) which will help keep your temperature down.  It is common for your temperature to cycle up and down following surgery, especially at night when you are not up moving around and exerting yourself.  The breathing machine keeps your lungs expanded and your temperature down.   DIET:  As you were doing prior to hospitalization, we recommend a well-balanced diet.  DRESSING / WOUND CARE / SHOWERING  Keep the surgical dressing until follow up.  The dressing is water proof, so you can shower without any extra covering.  IF THE DRESSING FALLS OFF or the wound gets wet inside, change the dressing with sterile gauze.  Please use good hand washing techniques before changing the dressing.  Do not use any lotions or creams on the incision until instructed by your surgeon.    ACTIVITY  Increase activity slowly as tolerated, but follow the weight bearing instructions below.   No driving for 6 weeks or until further direction given by your physician.  You cannot drive while taking narcotics.  No lifting or carrying greater than 10 lbs. until further directed by your surgeon. Avoid periods of inactivity such as sitting longer than an hour when not asleep. This helps prevent blood clots.  You may return to work once you are authorized by your doctor.     WEIGHT BEARING   Weight bearing as tolerated with assist device (walker, cane,  etc) as directed, use it as long as suggested by your surgeon or therapist, typically at least 4-6 weeks.   EXERCISES  Results after joint replacement surgery are often greatly improved when you follow the exercise, range of motion and muscle strengthening exercises prescribed by your doctor. Safety measures are also important to protect the joint from further injury. Any time any of these exercises cause you to have increased pain or swelling, decrease what you are doing until you are comfortable again and then slowly increase them. If you have problems or questions, call your caregiver or physical therapist for advice.   Rehabilitation is important following a joint replacement. After just a few days of immobilization, the muscles of the leg can become weakened and shrink (atrophy).  These exercises are designed to build up the tone and strength of the thigh and leg muscles and to improve motion. Often times heat used for twenty to thirty minutes before working out will loosen up your tissues and help with improving the range of motion but do not use heat for the first two weeks following surgery (sometimes heat can increase post-operative swelling).   These exercises can be done on a training (exercise) mat, on the floor, on a table or on a bed. Use whatever works the best and is most comfortable for you.    Use music or television while you are exercising so that the exercises are a pleasant break in your  day. This will make your life better with the exercises acting as a break in your routine that you can look forward to.   Perform all exercises about fifteen times, three times per day or as directed.  You should exercise both the operative leg and the other leg as well.  Exercises include:   Quad Sets - Tighten up the muscle on the front of the thigh (Quad) and hold for 5-10 seconds.   Straight Leg Raises - With your knee straight (if you were given a brace, keep it on), lift the leg to 60  degrees, hold for 3 seconds, and slowly lower the leg.  Perform this exercise against resistance later as your leg gets stronger.  Leg Slides: Lying on your back, slowly slide your foot toward your buttocks, bending your knee up off the floor (only go as far as is comfortable). Then slowly slide your foot back down until your leg is flat on the floor again.  Angel Wings: Lying on your back spread your legs to the side as far apart as you can without causing discomfort.  Hamstring Strength:  Lying on your back, push your heel against the floor with your leg straight by tightening up the muscles of your buttocks.  Repeat, but this time bend your knee to a comfortable angle, and push your heel against the floor.  You may put a pillow under the heel to make it more comfortable if necessary.   A rehabilitation program following joint replacement surgery can speed recovery and prevent re-injury in the future due to weakened muscles. Contact your doctor or a physical therapist for more information on knee rehabilitation.    CONSTIPATION  Constipation is defined medically as fewer than three stools per week and severe constipation as less than one stool per week.  Even if you have a regular bowel pattern at home, your normal regimen is likely to be disrupted due to multiple reasons following surgery.  Combination of anesthesia, postoperative narcotics, change in appetite and fluid intake all can affect your bowels.   YOU MUST use at least one of the following options; they are listed in order of increasing strength to get the job done.  They are all available over the counter, and you may need to use some, POSSIBLY even all of these options:    Drink plenty of fluids (prune juice may be helpful) and high fiber foods Colace 100 mg by mouth twice a day  Senokot for constipation as directed and as needed Dulcolax (bisacodyl), take with full glass of water  Miralax (polyethylene glycol) once or twice a day as  needed.  If you have tried all these things and are unable to have a bowel movement in the first 3-4 days after surgery call either your surgeon or your primary doctor.    If you experience loose stools or diarrhea, hold the medications until you stool forms back up.  If your symptoms do not get better within 1 week or if they get worse, check with your doctor.  If you experience "the worst abdominal pain ever" or develop nausea or vomiting, please contact the office immediately for further recommendations for treatment.   ITCHING:  If you experience itching with your medications, try taking only a single pain pill, or even half a pain pill at a time.  You can also use Benadryl over the counter for itching or also to help with sleep.   TED HOSE STOCKINGS:  Use stockings on both  legs until for at least 2 weeks or as directed by physician office. They may be removed at night for sleeping.  MEDICATIONS:  See your medication summary on the "After Visit Summary" that nursing will review with you.  You may have some home medications which will be placed on hold until you complete the course of blood thinner medication.  It is important for you to complete the blood thinner medication as prescribed.  PRECAUTIONS:  If you experience chest pain or shortness of breath - call 911 immediately for transfer to the hospital emergency department.   If you develop a fever greater that 101 F, purulent drainage from wound, increased redness or drainage from wound, foul odor from the wound/dressing, or calf pain - CONTACT YOUR SURGEON.                                                   FOLLOW-UP APPOINTMENTS:  If you do not already have a post-op appointment, please call the office for an appointment to be seen by your surgeon.  Guidelines for how soon to be seen are listed in your "After Visit Summary", but are typically between 1-4 weeks after surgery.  OTHER INSTRUCTIONS:   Knee Replacement:  Do not place pillow  under knee, focus on keeping the knee straight while resting. CPM instructions: 0-90 degrees, 2 hours in the morning, 2 hours in the afternoon, and 2 hours in the evening. Place foam block, curve side up under heel at all times except when in CPM or when walking.  DO NOT modify, tear, cut, or change the foam block in any way.  POST-OPERATIVE OPIOID TAPER INSTRUCTIONS: It is important to wean off of your opioid medication as soon as possible. If you do not need pain medication after your surgery it is ok to stop day one. Opioids include: Codeine, Hydrocodone(Norco, Vicodin), Oxycodone(Percocet, oxycontin) and hydromorphone amongst others.  Long term and even short term use of opiods can cause: Increased pain response Dependence Constipation Depression Respiratory depression And more.  Withdrawal symptoms can include Flu like symptoms Nausea, vomiting And more Techniques to manage these symptoms Hydrate well Eat regular healthy meals Stay active Use relaxation techniques(deep breathing, meditating, yoga) Do Not substitute Alcohol to help with tapering If you have been on opioids for less than two weeks and do not have pain than it is ok to stop all together.  Plan to wean off of opioids This plan should start within one week post op of your joint replacement. Maintain the same interval or time between taking each dose and first decrease the dose.  Cut the total daily intake of opioids by one tablet each day Next start to increase the time between doses. The last dose that should be eliminated is the evening dose.   Per [facility] clinic policy, our goal is ensure optimal postoperative pain control with a multimodal pain management strategy. For all [facility] patients, our goal is to wean post-operative narcotic medications by 6 weeks post-operatively. If this is not possible due to utilization of pain medication prior to surgery, your [facility] doctor will support your acute  post-operative pain control for the first 6 weeks postoperatively, with a plan to transition you back to your primary pain team following that. Cyndia Skeeters will work to ensure a Therapist, occupational.   MAKE SURE YOU:  Understand these  instructions.  Get help right away if you are not doing well or get worse.    Thank you for letting us be a part of your medical care team.  It is a privilege we respect greatly.  We hope these instructions will help you stay on track for a fast and full recovery!          1. Change dressings as needed 2. May shower but keep incisions covered and dry 3. Take your prescribed blood thinner to prevent blood clots 4. Take stool softeners as needed 5. Take pain meds as needed  I have reviewed the patient's history and given the presence of a fragility fracture, I have deemed the necessity of a osteoporosis management referral or confirmed that the patient is currently enrolled in a osteoporosis treatment program.

## 2023-12-11 NOTE — Anesthesia Procedure Notes (Signed)
Procedure Name: LMA Insertion Date/Time: 12/11/2023 8:00 AM  Performed by: Gloris Ham, CRNAPre-anesthesia Checklist: Patient identified, Emergency Drugs available, Suction available and Patient being monitored Patient Re-evaluated:Patient Re-evaluated prior to induction Oxygen Delivery Method: Circle System Utilized Preoxygenation: Pre-oxygenation with 100% oxygen Induction Type: IV induction Ventilation: Mask ventilation without difficulty LMA: LMA inserted LMA Size: 4.0 Number of attempts: 1 Airway Equipment and Method: Bite block Placement Confirmation: positive ETCO2 Tube secured with: Tape Dental Injury: Teeth and Oropharynx as per pre-operative assessment

## 2023-12-12 ENCOUNTER — Encounter (HOSPITAL_COMMUNITY): Payer: Self-pay | Admitting: Orthopaedic Surgery

## 2023-12-12 DIAGNOSIS — S72012A Unspecified intracapsular fracture of left femur, initial encounter for closed fracture: Secondary | ICD-10-CM

## 2023-12-12 DIAGNOSIS — E785 Hyperlipidemia, unspecified: Secondary | ICD-10-CM | POA: Diagnosis not present

## 2023-12-12 DIAGNOSIS — I10 Essential (primary) hypertension: Secondary | ICD-10-CM | POA: Diagnosis not present

## 2023-12-12 LAB — COMPREHENSIVE METABOLIC PANEL
ALT: 41 U/L (ref 0–44)
AST: 30 U/L (ref 15–41)
Albumin: 3.1 g/dL — ABNORMAL LOW (ref 3.5–5.0)
Alkaline Phosphatase: 62 U/L (ref 38–126)
Anion gap: 10 (ref 5–15)
BUN: 14 mg/dL (ref 8–23)
CO2: 22 mmol/L (ref 22–32)
Calcium: 8.9 mg/dL (ref 8.9–10.3)
Chloride: 99 mmol/L (ref 98–111)
Creatinine, Ser: 0.59 mg/dL (ref 0.44–1.00)
GFR, Estimated: >60 mL/min (ref >60)
Glucose, Bld: 118 mg/dL — ABNORMAL HIGH (ref 70–99)
Potassium: 4.1 mmol/L (ref 3.5–5.1)
Sodium: 131 mmol/L — ABNORMAL LOW (ref 135–145)
Total Bilirubin: 1.3 mg/dL — ABNORMAL HIGH (ref <1.2)
Total Protein: 6.4 g/dL — ABNORMAL LOW (ref 6.5–8.1)

## 2023-12-12 LAB — CBC
HCT: 38 % (ref 36.0–46.0)
Hemoglobin: 12.8 g/dL (ref 12.0–15.0)
MCH: 30.3 pg (ref 26.0–34.0)
MCHC: 33.7 g/dL (ref 30.0–36.0)
MCV: 89.8 fL (ref 80.0–100.0)
Platelets: 210 10*3/uL (ref 150–400)
RBC: 4.23 MIL/uL (ref 3.87–5.11)
RDW: 12.1 % (ref 11.5–15.5)
WBC: 12 10*3/uL — ABNORMAL HIGH (ref 4.0–10.5)
nRBC: 0 % (ref 0.0–0.2)

## 2023-12-12 LAB — MAGNESIUM: Magnesium: 1.9 mg/dL (ref 1.7–2.4)

## 2023-12-12 MED ORDER — ENOXAPARIN SODIUM 40 MG/0.4ML IJ SOSY: 40.0000 mg | PREFILLED_SYRINGE | Freq: Every day | INTRAMUSCULAR | 0 refills | Status: DC

## 2023-12-12 MED ORDER — OXYCODONE-ACETAMINOPHEN 5-325 MG PO TABS: 1.0000 | ORAL_TABLET | Freq: Two times a day (BID) | ORAL | 0 refills | Status: DC | PRN

## 2023-12-12 MED ORDER — DIPHENHYDRAMINE HCL 25 MG PO CAPS
25.0000 mg | ORAL_CAPSULE | Freq: Every evening | ORAL | Status: DC | PRN
  Administered 2023-12-12: 25 mg via ORAL
  Filled 2023-12-12: qty 1

## 2023-12-12 NOTE — Progress Notes (Signed)
   Subjective:  Patient reports pain as moderate.  Hasn't had PT yet.  Her OT daughter helped her out of the bed this morning.  Objective:   VITALS:   Vitals:   12/11/23 2005 12/11/23 2343 12/12/23 0323 12/12/23 0744  BP: (!) 145/87 (!) 141/72 123/72 106/67  Pulse: 82 73 75 78  Resp: 17 19 20 16   Temp: 98.8 F (37.1 C) 99.1 F (37.3 C) 98.3 F (36.8 C) 98.3 F (36.8 C)  TempSrc: Oral Oral Oral Oral  SpO2: 99% 96% 95% 96%  Weight:      Height:        Neurovascular intact Sensation intact distally Intact pulses distally Dorsiflexion/Plantar flexion intact Incision: dressing C/D/I and no drainage   Lab Results  Component Value Date   WBC 12.0 (H) 12/12/2023   HGB 12.8 12/12/2023   HCT 38.0 12/12/2023   MCV 89.8 12/12/2023   PLT 210 12/12/2023     Assessment/Plan:  1 Day Post-Op   - Expected postop acute blood loss anemia - Up with PT/OT - DVT ppx - SCDs, ambulation, lovenox - WBAT operative extremity   Glee Arvin 12/12/2023, 1:18 PM

## 2023-12-12 NOTE — Progress Notes (Signed)
[facility]                                                                               Michelle Cisneros, is a 67 y.o. female, DOB - 1956-11-19, KGM:[id number] Admit date - 12/09/2023    Outpatient Primary MD for the patient is Jerl Mina, MD  LOS - 3  days    Brief summary   Michelle Cisneros is a 67 y.o. female with medical history significant for HTN, HLD, pSVT, chronic hydrocephalus, lumbar spondylosis, OSA on CPAP who is admitted with acute left hip fracture after mechanical fall.   Assessment & Plan    Assessment and Plan:   Acute left proximal femur fracture Orthopedics consulted s/p THA on 12/22 Therapy evaluations pending. Continue to monitor Pain control   Essential hypertension Blood pressure parameters are optimal   Hyperlipidemia Continue with statin    Obstructive sleep apnea On CPAP at home    Depression Continue with Wellbutrin   Mild leukocytosis Continue to monitor   RN Pressure Injury Documentation:    Malnutrition Type:  Nutrition Problem: Increased nutrient needs Etiology: post-op healing   Malnutrition Characteristics:  Signs/Symptoms: estimated needs   Nutrition Interventions:  Interventions: Liberalize Diet, MVI  Estimated body mass index is 28.14 kg/m as calculated from the following:   Height as of this encounter: 5' 4.02" (1.626 m).   Weight as of this encounter: 74.4 kg.  Code Status: full code.  DVT Prophylaxis:  enoxaparin (LOVENOX) injection 40 mg Start: 12/12/23 2200 SCDs Start: 12/11/23 1033 Place TED hose Start: 12/11/23 1033 SCDs Start: 12/10/23 0015   Level of Care: Level of care: Med-Surg Family Communication: None at bedside.   Disposition Plan:     Remains inpatient appropriate:  pending therapy eval  Procedures:  THA   Consultants:   Orthopedics.   Antimicrobials:   Anti-infectives (From admission, onward)    Start     Dose/Rate Route Frequency Ordered Stop    12/11/23 1045  ceFAZolin (ANCEF) IVPB 2g/100 mL premix        2 g 200 mL/hr over 30 Minutes Intravenous Every 6 hours 12/11/23 1032 12/11/23 2209   12/11/23 0910  vancomycin (VANCOCIN) powder  Status:  Discontinued          As needed 12/11/23 0910 12/11/23 0943   12/11/23 0730  ceFAZolin (ANCEF) IVPB 2g/100 mL premix        2 g 200 mL/hr over 30 Minutes Intravenous On call to O.R. 12/11/23 0716 12/11/23 0817   12/11/23 0723  ceFAZolin (ANCEF) 2-4 GM/100ML-% IVPB  Status:  Discontinued       Note to Pharmacy: Crissie Sickles: cabinet override      12/11/23 0723 12/11/23 0817   12/11/23 0557  ceFAZolin (ANCEF) 2-4 GM/100ML-% IVPB       Note to Pharmacy: Phebe Colla N: cabinet override      12/11/23 0557 12/11/23 0821        Medications  Scheduled Meds:  atorvastatin  10 mg Oral Daily   buPROPion  300 mg Oral Daily   docusate sodium  100 mg Oral BID   enoxaparin (LOVENOX) injection  40 mg Subcutaneous Q24H  multivitamin with minerals  1 tablet Oral Daily   mupirocin ointment  1 Application Nasal BID   Continuous Infusions: PRN Meds:.acetaminophen, alum & mag hydroxide-simeth, HYDROmorphone (DILAUDID) injection, magnesium citrate, menthol-cetylpyridinium **OR** phenol, methocarbamol **OR** methocarbamol (ROBAXIN) injection, ondansetron **OR** ondansetron (ZOFRAN) IV, oxyCODONE, oxyCODONE, polyethylene glycol, sorbitol    Subjective:   Michelle Cisneros was seen and examined today.  Pain not well controlled.   Objective:   Vitals:   12/11/23 2005 12/11/23 2343 12/12/23 0323 12/12/23 0744  BP: (!) 145/87 (!) 141/72 123/72 106/67  Pulse: 82 73 75 78  Resp: 17 19 20 16   Temp: 98.8 F (37.1 C) 99.1 F (37.3 C) 98.3 F (36.8 C) 98.3 F (36.8 C)  TempSrc: Oral Oral Oral Oral  SpO2: 99% 96% 95% 96%  Weight:      Height:        Intake/Output Summary (Last 24 hours) at 12/12/2023 1418 Last data filed at 12/12/2023 0030 Gross per 24 hour  Intake 340 ml  Output 850 ml   Net -510 ml   Filed Weights   12/09/23 2116 12/11/23 0714  Weight: 74.4 kg 74.4 kg     Exam General exam: Appears calm and comfortable  Respiratory system: Clear to auscultation. Respiratory effort normal. Cardiovascular system: S1 & S2 heard, RRR. No JVD,  Gastrointestinal system: Abdomen is nondistended, soft and nontender.  Central nervous system: Alert and oriented. No focal neurological deficits. Extremities: Symmetric 5 x 5 power. Skin: No rashes,  Psychiatry:  Mood & affect appropriate.     Data Reviewed:  I have personally reviewed following labs and imaging studies   CBC Lab Results  Component Value Date   WBC 12.0 (H) 12/12/2023   RBC 4.23 12/12/2023   HGB 12.8 12/12/2023   HCT 38.0 12/12/2023   MCV 89.8 12/12/2023   MCH 30.3 12/12/2023   PLT 210 12/12/2023   MCHC 33.7 12/12/2023   RDW 12.1 12/12/2023   LYMPHSABS 0.6 (L) 12/09/2023   MONOABS 0.5 12/09/2023   EOSABS 0.0 12/09/2023   BASOSABS 0.0 12/09/2023     Last metabolic panel Lab Results  Component Value Date   NA 131 (L) 12/12/2023   K 4.1 12/12/2023   CL 99 12/12/2023   CO2 22 12/12/2023   BUN 14 12/12/2023   CREATININE 0.59 12/12/2023   GLUCOSE 118 (H) 12/12/2023   GFRNONAA >60 12/12/2023   GFRAA >60 05/22/2020   CALCIUM 8.9 12/12/2023   PROT 6.4 (L) 12/12/2023   ALBUMIN 3.1 (L) 12/12/2023   BILITOT 1.3 (H) 12/12/2023   ALKPHOS 62 12/12/2023   AST 30 12/12/2023   ALT 41 12/12/2023   ANIONGAP 10 12/12/2023    CBG (last 3)  No results for input(s): "GLUCAP" in the last 72 hours.    Coagulation Profile: No results for input(s): "INR", "PROTIME" in the last 168 hours.   Radiology Studies: DG Pelvis Portable Result Date: 12/11/2023 CLINICAL DATA:  Previous internal fixation of the left hip. EXAM: PORTABLE PELVIS 1-2 VIEWS COMPARISON:  12/09/2023 FINDINGS: Postoperative changes with a left total hip arthroplasty using non cemented femoral component and single screw fixing the  acetabular component. Components appear well seated. Normal alignment. No acute fracture or dislocation. Degenerative changes are demonstrated in the right hip. IMPRESSION: Left total hip arthroplasty.  No acute complication is suggested. Electronically Signed   By: Burman Nieves M.D.   On: 12/11/2023 18:18   DG HIP UNILAT WITH PELVIS 1V LEFT Result Date: 12/11/2023 CLINICAL DATA:  Left femoral  neck fracture. EXAM: DG HIP (WITH OR WITHOUT PELVIS) 1V*L* COMPARISON:  Femur radiographs dated 12/17/2023. FINDINGS: Intraoperative fluoroscopy images demonstrate a total left hip arthroplasty. The hardware appears intact and well aligned. IMPRESSION: Intraoperative fluoroscopy images demonstrate a total left hip arthroplasty. Electronically Signed   By: Romona Curls M.D.   On: 12/11/2023 11:21   DG C-Arm 1-60 Min-No Report Result Date: 12/11/2023 Fluoroscopy was utilized by the requesting physician.  No radiographic interpretation.   DG C-Arm 1-60 Min-No Report Result Date: 12/11/2023 Fluoroscopy was utilized by the requesting physician.  No radiographic interpretation.       Kathlen Mody M.D. [facility] 12/12/2023, 2:18 PM  Available via Epic secure chat 7am-7pm After 7 pm, please refer to night coverage provider listed on amion.

## 2023-12-12 NOTE — Progress Notes (Signed)
Foley dc'd at 0630 per order. Pt tolerated well, no bleeding, no pain noted. Pt transferred from bed to chair w/ RW x 1 assist. Call bell in reach

## 2023-12-12 NOTE — Evaluation (Signed)
Occupational Therapy Evaluation Patient Details Name: Michelle Cisneros MRN: [id number] DOB: [birth year] Today's Date: 12/12/2023   History of Present Illness Patient is a 67 year old female who was comes into the hospital with left hip pain after a fall. This happened [place] she [place] and stepped off a curb losing her balance. Did not hit her head, no loss of consciousness. She was found to have a left femoral neck fracture.  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  PMH of HTN, HLD, paroxysmal SVT, chronic hydrocephalus, lumbar spondylosis.   Clinical Impression   Pt currently at min assist level for simulated selfcare tasks, toilet transfers, and simulated walk-in shower transfers.  Slight increased left hip pain with mobility but overall tolerating movement well.  Prior to admission she was independent and lives with her spouse who can provide min assist as needed.  Feel based on current level of assist she will benefit from acute care OT to progress to work in strengthening, safety, and integration of AE/DME in order to progress to a supervision level for ADLs.  Recommend HHOT eval for safety.       If plan is discharge home, recommend the following: A little help with walking and/or transfers;A little help with bathing/dressing/bathroom;Assistance with cooking/housework;Assist for transportation;Help with stairs or ramp for entrance    Functional Status Assessment  Patient has had a recent decline in their functional status and demonstrates the ability to make significant improvements in function in a reasonable and predictable amount of time.  Equipment Recommendations  None recommended by OT       Precautions / Restrictions Precautions Precautions: Fall Restrictions Weight Bearing Restrictions Per Provider Order: No LLE Weight Bearing Per Provider Order: Weight bearing as tolerated      Mobility Bed Mobility                     Transfers Overall transfer level: Needs assistance Equipment used: Rolling walker (2 wheels) Transfers: Sit to/from Stand, Bed to chair/wheelchair/BSC Sit to Stand: Min assist     Step pivot transfers: Min assist     General transfer comment: Min instructional cueing for sequencing sit to stand and stand to sit for hand placement and technique.  Mod instructional cueing to not step too close or in front of the RW with mobility.      Balance Overall balance assessment: Needs assistance   Sitting balance-Leahy Scale: Normal     Standing balance support: During functional activity, Reliant on assistive device for balance Standing balance-Leahy Scale: Poor Standing balance comment: Patient needs UE support for balance with mobility.                           ADL either performed or assessed with clinical judgement   ADL Overall ADL's : Needs assistance/impaired Eating/Feeding: Independent;Sitting   Grooming: Wash/dry hands;Wash/dry face;Standing;Contact guard assist   Upper Body Bathing: Set up;Sitting   Lower Body Bathing: Minimal assistance;Sit to/from stand   Upper Body Dressing : Sitting;Set up   Lower Body Dressing: Minimal assistance;Sit to/from stand   Toilet Transfer: Minimal assistance;Rolling walker (2 wheels);Ambulation;Grab bars;Comfort height toilet   Toileting- Clothing Manipulation and Hygiene: Minimal assistance;Sit to/from stand   Tub/ Shower Transfer: Minimal assistance;Ambulation;Shower seat;Walk-in shower   Functional mobility during ADLs: Minimal assistance;Rolling walker (2 wheels) General ADL Comments: Provided education on walker safety during mobilization secondary to patient getting too close to the front of the  walker and not allowing adequate hip extension over her feet.  Discussed need for a walker bag for use when up to help transport items as well as not having throw rugs in the bathroom and using a towel to dry the inside of the  shower before attempting to transfer out post bathing.  Practiced posterior transfer into the shower and and anterior stepping out with simulated built in seat.  Pt's spouse there and supportive and recently had an ankle fx, so he is aware of techniques and has a sockaide pt can use if needed.     Vision Baseline Vision/History: 1 Wears glasses Ability to See in Adequate Light: 0 Adequate Patient Visual Report: No change from baseline Vision Assessment?: No apparent visual deficits     Perception Perception: Within Functional Limits       Praxis Praxis: WFL       Pertinent Vitals/Pain Pain Assessment Pain Assessment: Faces Faces Pain Scale: Hurts a little bit Pain Location: Left hip Pain Descriptors / Indicators: Discomfort, Grimacing Pain Intervention(s): Limited activity within patient's tolerance, Repositioned     Extremity/Trunk Assessment Upper Extremity Assessment Upper Extremity Assessment: Overall WFL for tasks assessed   Lower Extremity Assessment Lower Extremity Assessment: Defer to PT evaluation   Cervical / Trunk Assessment Cervical / Trunk Assessment: Other exceptions Cervical / Trunk Exceptions: flexed head and trunk   Communication Communication Communication: No apparent difficulties Cueing Techniques: Verbal cues   Cognition Arousal: Alert Behavior During Therapy: WFL for tasks assessed/performed Overall Cognitive Status: Within Functional Limits for tasks assessed                                                  Home Living Family/patient expects to be discharged to:: Private residence Living Arrangements: Spouse/significant other Available Help at Discharge: Family Type of Home: House Home Access: Level entry     Home Layout: Multi-level;Able to live on main level with bedroom/bathroom     Bathroom Shower/Tub: Producer, television/film/video: Standard     Home Equipment: Grab bars - toilet;Grab bars -  tub/shower;Shower seat - built Charity fundraiser (2 wheels);BSC/3in1          Prior Functioning/Environment Prior Level of Function : Independent/Modified Independent                        OT Problem List: Decreased strength;Impaired balance (sitting and/or standing);Pain;Decreased knowledge of use of DME or AE      OT Treatment/Interventions: Self-care/ADL training;DME and/or AE instruction;Therapeutic activities;Balance training;Patient/family education    OT Goals(Current goals can be found in the care plan section) Acute Rehab OT Goals Patient Stated Goal: Pt did not state but agreeable to working with therapy. OT Goal Formulation: With patient Time For Goal Achievement: 12/12/23 Potential to Achieve Goals: Good  OT Frequency: Min 1X/week       AM-PAC OT "6 Clicks" Daily Activity     Outcome Measure Help from another person eating meals?: None Help from another person taking care of personal grooming?: A Little Help from another person toileting, which includes using toliet, bedpan, or urinal?: A Little Help from another person bathing (including washing, rinsing, drying)?: A Little Help from another person to put on and taking off regular upper body clothing?: A Little Help from another person to put on and taking  off regular lower body clothing?: A Little 6 Click Score: 19   End of Session Equipment Utilized During Treatment: Gait belt Nurse Communication: Mobility status  Activity Tolerance: Patient tolerated treatment well Patient left: in chair;with call bell/phone within reach  OT Visit Diagnosis: Unsteadiness on feet (R26.81);Other abnormalities of gait and mobility (R26.89);Pain Pain - Right/Left: Left Pain - part of body: Hip                Time: 1116-1200 OT Time Calculation (min): 44 min Charges:  OT General Charges $OT Visit: 1 Visit OT Evaluation $OT Eval Moderate Complexity: 1 Mod OT Treatments $Self Care/Home Management : 23-37  mins  Perrin Maltese, OTR/L Acute Rehabilitation Services  Office [phone] 12/12/2023   Chistine Dematteo 12/12/2023, 1:27 PM

## 2023-12-12 NOTE — Plan of Care (Signed)

## 2023-12-12 NOTE — Evaluation (Signed)
Physical Therapy Evaluation  Patient Details Name: Michelle Cisneros MRN: [id number] DOB: 30-Jun-1956 Today's Date: 12/12/2023  History of Present Illness  Patient is a 67 year old female who was comes into the hospital with left hip pain after a fall. This happened while she was [place] a [place] and stepped off a curb losing her balance. Did not hit her head, no loss of consciousness. She was found to have a left femoral neck fracture.  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  PMH of HTN, HLD, paroxysmal SVT, chronic hydrocephalus, lumbar spondylosis.   Clinical Impression  Pt admitted with above diagnosis. Pt currently with functional limitations due to the deficits listed below (see PT Problem List). At the time of PT eval pt was able to perform transfers and ambulation with gross CGA and RW for support. Overall pt is moving well but cadence and sequencing with gait training need improvement to decrease risk for falls. Pt will benefit from acute skilled PT to increase their independence and safety with mobility to allow discharge.           If plan is discharge home, recommend the following: A little help with walking and/or transfers;A little help with bathing/dressing/bathroom;Assistance with cooking/housework;Assist for transportation;Help with stairs or ramp for entrance   Can travel by private vehicle        Equipment Recommendations Rolling walker (2 wheels);BSC/3in1  Recommendations for Other Services       Functional Status Assessment Patient has had a recent decline in their functional status and demonstrates the ability to make significant improvements in function in a reasonable and predictable amount of time.     Precautions / Restrictions Precautions Precautions: Fall Restrictions Weight Bearing Restrictions Per Provider Order: No LLE Weight Bearing Per Provider Order: Weight bearing as tolerated      Mobility  Bed Mobility                General bed mobility comments: Pt was received sitting up in the recliner.    Transfers Overall transfer level: Needs assistance Equipment used: Rolling walker (2 wheels) Transfers: Sit to/from Stand, Bed to chair/wheelchair/BSC Sit to Stand: Contact guard assist   Step pivot transfers: Contact guard assist       General transfer comment: VC's for sequencing and general safety with the RW for support. Hands on guarding for safety.    Ambulation/Gait Ambulation/Gait assistance: Contact guard assist Gait Distance (Feet): 200 Feet Assistive device: Rolling walker (2 wheels) Gait Pattern/deviations: Step-through pattern, Step-to pattern, Decreased stride length, Decreased step length - right, Antalgic, Trunk flexed Gait velocity: Decreased Gait velocity interpretation: 1.31 - 2.62 ft/sec, indicative of limited community ambulator   General Gait Details: Poor sequencing with uneven cadence and rushed gait speed. Pt with quick step-to pattern initially and was essentially leaving her RLE behind. With cues, able to even out step/stride length, improve heel strike, and demonstrate a more even cadence.  Stairs            Wheelchair Mobility     Tilt Bed    Modified Rankin (Stroke Patients Only)       Balance Overall balance assessment: Needs assistance   Sitting balance-Leahy Scale: Normal     Standing balance support: During functional activity, Reliant on assistive device for balance Standing balance-Leahy Scale: Poor Standing balance comment: Patient needs UE support for balance with mobility.  Pertinent Vitals/Pain Pain Assessment Pain Assessment: Faces Faces Pain Scale: Hurts a little bit Pain Location: Left hip Pain Descriptors / Indicators: Discomfort, Grimacing Pain Intervention(s): Limited activity within patient's tolerance, Monitored during session, Repositioned    Home Living Family/patient expects to be  discharged to:: Private residence Living Arrangements: Spouse/significant other Available Help at Discharge: Family Type of Home: House Home Access: Level entry       Home Layout: Multi-level;Able to live on main level with bedroom/bathroom Home Equipment: Grab bars - toilet;Grab bars - tub/shower;Shower seat - built Charity fundraiser (2 wheels);BSC/3in1      Prior Function Prior Level of Function : Independent/Modified Independent                     Extremity/Trunk Assessment   Upper Extremity Assessment Upper Extremity Assessment: Defer to OT evaluation    Lower Extremity Assessment Lower Extremity Assessment: LLE deficits/detail LLE Deficits / Details: Acute pain, decreased strength and AROM consistent with above mentioned surgery.    Cervical / Trunk Assessment Cervical / Trunk Assessment: Other exceptions Cervical / Trunk Exceptions: flexed head and trunk - baseline 2 spinal stenosis per pt report  Communication   Communication Communication: No apparent difficulties Cueing Techniques: Verbal cues;Gestural cues  Cognition Arousal: Alert Behavior During Therapy: WFL for tasks assessed/performed Overall Cognitive Status: Within Functional Limits for tasks assessed                                          General Comments      Exercises Total Joint Exercises Ankle Circles/Pumps: 10 reps Quad Sets: 10 reps Short Arc Quad: 10 reps Hip ABduction/ADduction: 10 reps Long Arc Quad: 10 reps   Assessment/Plan    PT Assessment Patient needs continued PT services  PT Problem List Decreased strength;Decreased activity tolerance;Decreased balance;Decreased mobility;Decreased knowledge of use of DME;Decreased safety awareness;Decreased knowledge of precautions;Pain       PT Treatment Interventions DME instruction;Gait training;Stair training;Functional mobility training;Therapeutic activities;Therapeutic exercise;Balance training;Patient/family  education    PT Goals (Current goals can be found in the Care Plan section)  Acute Rehab PT Goals Patient Stated Goal: Be able to go to her daughter's wedding in [place] Jan 18th PT Goal Formulation: With patient/family Time For Goal Achievement: 12/26/23 Potential to Achieve Goals: Good    Frequency Min 1X/week     Co-evaluation               AM-PAC PT "6 Clicks" Mobility  Outcome Measure Help needed turning from your back to your side while in a flat bed without using bedrails?: A Little Help needed moving from lying on your back to sitting on the side of a flat bed without using bedrails?: A Little Help needed moving to and from a bed to a chair (including a wheelchair)?: A Little Help needed standing up from a chair using your arms (e.g., wheelchair or bedside chair)?: A Little Help needed to walk in hospital room?: A Little Help needed climbing 3-5 steps with a railing? : A Little 6 Click Score: 18    End of Session Equipment Utilized During Treatment: Gait belt Activity Tolerance: Patient tolerated treatment well Patient left: in chair;with call bell/phone within reach;with family/visitor present Nurse Communication: Mobility status PT Visit Diagnosis: Unsteadiness on feet (R26.81);Pain Pain - Right/Left: Left Pain - part of body: Hip    Time: 4696-2952 PT Time Calculation (  min) (ACUTE ONLY): 26 min   Charges:   PT Evaluation $PT Eval Moderate Complexity: 1 Mod PT Treatments $Gait Training: 8-22 mins PT General Charges $$ ACUTE PT VISIT: 1 Visit         Conni Slipper, PT, DPT Acute Rehabilitation Services Secure Chat Preferred Office: [phone]   Marylynn Pearson 12/12/2023, 4:37 PM

## 2023-12-12 NOTE — Plan of Care (Signed)
  Problem: Education: Goal: Knowledge of General Education information will improve Description: Including pain rating scale, medication(s)/side effects and non-pharmacologic comfort measures Outcome: Progressing   Problem: Activity: Goal: Risk for activity intolerance will decrease Outcome: Progressing   Problem: Coping: Goal: Level of anxiety will decrease Outcome: Progressing   Problem: Safety: Goal: Ability to remain free from injury will improve Outcome: Progressing   Problem: Pain Management: Goal: General experience of comfort will improve Outcome: Progressing   Problem: Elimination: Goal: Will not experience complications related to bowel motility Outcome: Progressing Goal: Will not experience complications related to urinary retention Outcome: Progressing

## 2023-12-13 DIAGNOSIS — S72012A Unspecified intracapsular fracture of left femur, initial encounter for closed fracture: Secondary | ICD-10-CM | POA: Diagnosis not present

## 2023-12-13 DIAGNOSIS — I10 Essential (primary) hypertension: Secondary | ICD-10-CM | POA: Diagnosis not present

## 2023-12-13 DIAGNOSIS — G4733 Obstructive sleep apnea (adult) (pediatric): Secondary | ICD-10-CM

## 2023-12-13 DIAGNOSIS — E785 Hyperlipidemia, unspecified: Secondary | ICD-10-CM | POA: Diagnosis not present

## 2023-12-13 LAB — CBC
HCT: 35.6 % — ABNORMAL LOW (ref 36.0–46.0)
Hemoglobin: 12.3 g/dL (ref 12.0–15.0)
MCH: 31.1 pg (ref 26.0–34.0)
MCHC: 34.6 g/dL (ref 30.0–36.0)
MCV: 90.1 fL (ref 80.0–100.0)
Platelets: 211 10*3/uL (ref 150–400)
RBC: 3.95 MIL/uL (ref 3.87–5.11)
RDW: 12.1 % (ref 11.5–15.5)
WBC: 9.5 10*3/uL (ref 4.0–10.5)
nRBC: 0 % (ref 0.0–0.2)

## 2023-12-13 LAB — BASIC METABOLIC PANEL
Anion gap: 6 (ref 5–15)
BUN: 13 mg/dL (ref 8–23)
CO2: 25 mmol/L (ref 22–32)
Calcium: 8.6 mg/dL — ABNORMAL LOW (ref 8.9–10.3)
Chloride: 102 mmol/L (ref 98–111)
Creatinine, Ser: 0.64 mg/dL (ref 0.44–1.00)
GFR, Estimated: >60 mL/min (ref >60)
Glucose, Bld: 105 mg/dL — ABNORMAL HIGH (ref 70–99)
Potassium: 4 mmol/L (ref 3.5–5.1)
Sodium: 133 mmol/L — ABNORMAL LOW (ref 135–145)

## 2023-12-13 MED ORDER — DOCUSATE SODIUM 100 MG PO CAPS: 100.0000 mg | ORAL_CAPSULE | Freq: Two times a day (BID) | ORAL | 0 refills | Status: DC | PRN

## 2023-12-13 MED ORDER — POLYETHYLENE GLYCOL 3350 17 G PO PACK: 17.0000 g | PACK | Freq: Every day | ORAL | 0 refills | Status: DC | PRN

## 2023-12-13 MED ORDER — ADULT MULTIVITAMIN W/MINERALS CH: 1.0000 | ORAL_TABLET | Freq: Every day | ORAL | Status: AC

## 2023-12-13 MED ORDER — MUPIROCIN 2 % EX OINT: 1.0000 | TOPICAL_OINTMENT | Freq: Two times a day (BID) | CUTANEOUS | 0 refills | Status: DC

## 2023-12-13 NOTE — Progress Notes (Signed)
Explained discharge instructions to patient. Reviewed follow up appointment and next medication administration times. Also reviewed education. Patient verbalized having an understanding for instructions given. All belongings are in the patient's possession to include patient's prescriptions. IV was removed. No other needs verbalized. Will transport downstairs for discharge.

## 2023-12-13 NOTE — Progress Notes (Signed)
Physical Therapy Treatment Patient Details Name: Michelle Cisneros MRN: [id number] DOB: Feb 20, 1956 Today's Date: 12/13/2023   History of Present Illness Patient is a 67 year old female who was comes into the hospital with left hip pain after a fall. This happened while she was [place] and stepped off a curb losing her balance. Did not hit her head, no loss of consciousness. She was found to have a left femoral neck fracture.  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  PMH of HTN, HLD, paroxysmal SVT, chronic hydrocephalus, lumbar spondylosis.    PT Comments  Pt received in chair, pleasantly agreeable to therapy session and with good participation and tolerance for transfer and gait training with RW, RW adjusted for proper height when it arrived to her room. Pt needing up to CGA for safety with transfers and Supervision for gait this date using RW. Reviewed use of ice, hip HEP with teachback on exercises, benefits of mobility and activity pacing. Pt continues to benefit from PT services to progress toward functional mobility goals, anticipate pt safe to DC home with HHPT services once she is medically cleared.    If plan is discharge home, recommend the following: A little help with walking and/or transfers;A little help with bathing/dressing/bathroom;Assistance with cooking/housework;Assist for transportation;Help with stairs or ramp for entrance   Can travel by private vehicle        Equipment Recommendations  Rolling walker (2 wheels);BSC/3in1    Recommendations for Other Services       Precautions / Restrictions Precautions Precautions: Fall Precaution Comments: HEP handout for post-hip surgery brought to her room, reviewed use of IS and neutral hip posture when resting, no formal hip precs Restrictions Weight Bearing Restrictions Per Provider Order: Yes LLE Weight Bearing Per Provider Order: Weight bearing as tolerated     Mobility  Bed Mobility                General bed mobility comments: Pt was received sitting up in the recliner. Discussed using gait belt as leg lifter to assist her LLE over EOB.    Transfers Overall transfer level: Needs assistance Equipment used: Rolling walker (2 wheels) Transfers: Sit to/from Stand, Bed to chair/wheelchair/BSC Sit to Stand: Contact guard assist, Supervision           General transfer comment: VC's for sequencing and general safety with the RW for support. Hands on guarding for safety initially progressing to Supervision. Slightly decreased eccentric control with stand>sit, cues to take her time.    Ambulation/Gait Ambulation/Gait assistance: Supervision Gait Distance (Feet): 150 Feet Assistive device: Rolling walker (2 wheels) Gait Pattern/deviations: Step-through pattern, Step-to pattern, Decreased stride length, Decreased step length - right, Antalgic, Trunk flexed Gait velocity: Decreased     General Gait Details: Improved RW use, proximity and cadence this date, min cues for step-to sequencing and keeping LLE ahead to reduce pain/discomfort, pt receptive with good carryover of info. HR 99 bpm and SpO2 96% on RA during trial   Stairs Stairs:  (pt does not need to perform stairs at home)           Wheelchair Mobility     Tilt Bed    Modified Rankin (Stroke Patients Only)       Balance Overall balance assessment: Needs assistance Sitting-balance support: No upper extremity supported Sitting balance-Leahy Scale: Normal     Standing balance support: During functional activity, Reliant on assistive device for balance Standing balance-Leahy Scale: Poor Standing balance comment: Patient needs  UE support for balance with mobility.                            Cognition Arousal: Alert Behavior During Therapy: WFL for tasks assessed/performed Overall Cognitive Status: Within Functional Limits for tasks assessed                                           Exercises Total Joint Exercises Ankle Circles/Pumps: 10 reps, Seated, AROM Quad Sets: 5 reps, Seated (reviewed QS in supine/recliner, can do bilaterally when legs together and resting) Heel Slides:  (vcs for technique./demo) Hip ABduction/ADduction: 5 reps, AROM, Left, Seated (a couple reps in chair for teachback and visual demo for standing hip abd per HEP handout) Long Arc Quad: 10 reps, AROM, Left, Seated Marching in Standing:  (visual/verbal demo) Standing Hip Extension:  (visual/verbal demo) Other Exercises Other Exercises: also visual/verbal demo for standing hip abd and Hamstring curls with proper technique per HEP handout    General Comments General comments (skin integrity, edema, etc.): Reviewed ice frequency/skin observation to ensure integrity with use of ice PRN      Pertinent Vitals/Pain Pain Assessment Pain Assessment: Faces Faces Pain Scale: Hurts little more Pain Location: Left hip burning/sore Pain Descriptors / Indicators: Discomfort, Grimacing, Burning Pain Intervention(s): Monitored during session, Premedicated before session, Repositioned, Ice applied    Home Living                          Prior Function            PT Goals (current goals can now be found in the care plan section) Acute Rehab PT Goals Patient Stated Goal: Be able to go to her daughter's wedding in [place] Jan 18th PT Goal Formulation: With patient/family Time For Goal Achievement: 12/26/23 Progress towards PT goals: Progressing toward goals    Frequency    Min 1X/week      PT Plan      Co-evaluation              AM-PAC PT "6 Clicks" Mobility   Outcome Measure  Help needed turning from your back to your side while in a flat bed without using bedrails?: A Little Help needed moving from lying on your back to sitting on the side of a flat bed without using bedrails?: A Little Help needed moving to and from a bed to a chair (including a  wheelchair)?: A Little Help needed standing up from a chair using your arms (e.g., wheelchair or bedside chair)?: A Little Help needed to walk in hospital room?: A Little Help needed climbing 3-5 steps with a railing? : A Little 6 Click Score: 18    End of Session Equipment Utilized During Treatment: Gait belt (pt has gait belt for home) Activity Tolerance: Patient tolerated treatment well Patient left: in chair;with call bell/phone within reach;with family/visitor present (spouse in room) Nurse Communication: Mobility status PT Visit Diagnosis: Unsteadiness on feet (R26.81);Pain Pain - Right/Left: Left Pain - part of body: Hip     Time: 1227-1255 PT Time Calculation (min) (ACUTE ONLY): 28 min  Charges:    $Gait Training: 8-22 mins $Therapeutic Exercise: 8-22 mins PT General Charges $$ ACUTE PT VISIT: 1 Visit  Florina Ou., PTA Acute Rehabilitation Services Secure Chat Preferred 9a-5:30pm Office: [phone]    Dorathy Kinsman [facility] 12/13/2023, 1:21 PM

## 2023-12-13 NOTE — TOC Initial Note (Signed)
Transition of Care [facility]) - Initial/Assessment Note    Patient Details  Name: Michelle Cisneros MRN: [id number] Date of Birth: 10-11-1956  Transition of Care [facility]) CM/SW Contact:    Gala Lewandowsky, RN Phone Number: 12/13/2023, 12:01 PM  Clinical Narrative: Patient plans to transition home today. Patient in need of HH PT/OT- Case Manager spoke with patient and she has no preference for agency. Case Manager made the referral with [facility] and start of care will begin Thursday or Friday. Office to call the patient. DME to be delivered to the room and staff RN is aware. No further needs identified at this time.                   Expected Discharge Plan: Home w Home Health Services Barriers to Discharge: No Barriers Identified   Patient Goals and CMS Choice Patient states their goals for this hospitalization and ongoing recovery are:: to return home.   Choice offered to / list presented to : Patient (Patient has no preference- [facility] arranged.)      Expected Discharge Plan and Services In-house Referral: NA Discharge Planning Services: CM Consult Post Acute Care Choice: Home Health Living arrangements for the past 2 months: Single Family Home                 DME Arranged: Walker rolling, Bedside commode DME Agency: [employer] Date DME Agency Contacted: 12/13/23 Time DME Agency Contacted: [phone] Representative spoke with at DME Agency: Vaughan Basta HH Arranged: PT, OT HH Agency: [facility] Date [facility] Agency Contacted: 12/13/23 Time HH Agency Contacted: 0930 Representative spoke with at [facility] Agency: Kandee Keen  Prior Living Arrangements/Services Living arrangements for the past 2 months: Single Family Home Lives with:: Spouse Patient language and need for interpreter reviewed:: Yes        Need for Family Participation in Patient Care: Yes (Comment) Care giver support system in place?: Yes (comment) Current home services: DME Criminal Activity/Legal  Involvement Pertinent to Current Situation/Hospitalization: No - Comment as needed  Activities of Daily Living   ADL Screening (condition at time of admission) Independently performs ADLs?: No Does the patient have a NEW difficulty with bathing/dressing/toileting/self-feeding that is expected to last >3 days?: Yes (Initiates electronic notice to provider for possible OT consult) Does the patient have a NEW difficulty with getting in/out of bed, walking, or climbing stairs that is expected to last >3 days?: Yes (Initiates electronic notice to provider for possible PT consult) Does the patient have a NEW difficulty with communication that is expected to last >3 days?: No Is the patient deaf or have difficulty hearing?: No Does the patient have difficulty seeing, even when wearing glasses/contacts?: No Does the patient have difficulty concentrating, remembering, or making decisions?: No  Permission Sought/Granted Permission sought to share information with : Case Manager, Family Supports, Oceanographer granted to share information with : Yes, Verbal Permission Granted     Permission granted to share info w AGENCY: [facility]        Emotional Assessment Appearance:: Appears stated age Attitude/Demeanor/Rapport: Engaged Affect (typically observed): Appropriate Orientation: : Oriented to Self, Oriented to Place, Oriented to  Time, Oriented to Situation Alcohol / Substance Use: Not Applicable Psych Involvement: No (comment)  Admission diagnosis:  Left hip pain [M25.552] Closed fracture of proximal end of left femur, initial encounter (HCC) [S72.002A] Patient Active Problem List   Diagnosis Date Noted   Closed subcapital fracture of neck of left femur, initial  encounter (HCC) 12/09/2023   PSVT (paroxysmal supraventricular tachycardia) (HCC) 05/27/2023   Recurrent falls 08/16/2022   Hydrocephalus (HCC) 08/16/2022   Depression 08/16/2022   OSA (obstructive  sleep apnea) 04/27/2022   Palpitations 10/03/2021   Dyspnea on exertion 04/19/2020   Aortic atherosclerosis (HCC) 04/19/2020   Essential hypertension 04/19/2020   Hyperlipidemia 04/19/2020   Right carpal tunnel syndrome 11/09/2016   PCP:  Jerl Mina, MD Pharmacy:   [facility], [place] - [address] [place] [address] [place] [address] Phone: [phone] Fax: [phone]  [facility] [phone] - [place], [place] - [address] [place] [address] Phone: [phone] Fax: [phone]     Social Drivers of Health (SDOH) Social History: SDOH Screenings   Food Insecurity: No Food Insecurity (12/10/2023)  Housing: Unknown (12/10/2023)  Transportation Needs: No Transportation Needs (12/10/2023)  Utilities: Not At Risk (12/10/2023)  Financial Resource Strain: Medium Risk (03/17/2023)   Received from [facility], [facility]  Tobacco Use: Medium Risk (12/11/2023)   SDOH Interventions:     Readmission Risk Interventions     No data to display

## 2023-12-13 NOTE — Plan of Care (Signed)
  Problem: Education: Goal: Knowledge of General Education information will improve Description: Including pain rating scale, medication(s)/side effects and non-pharmacologic comfort measures Outcome: Adequate for Discharge   Problem: Health Behavior/Discharge Planning: Goal: Ability to manage health-related needs will improve Outcome: Adequate for Discharge   Problem: Clinical Measurements: Goal: Ability to maintain clinical measurements within normal limits will improve Outcome: Adequate for Discharge Goal: Will remain free from infection Outcome: Adequate for Discharge Goal: Diagnostic test results will improve Outcome: Adequate for Discharge Goal: Respiratory complications will improve Outcome: Adequate for Discharge Goal: Cardiovascular complication will be avoided Outcome: Adequate for Discharge   Problem: Activity: Goal: Risk for activity intolerance will decrease Outcome: Adequate for Discharge   Problem: Nutrition: Goal: Adequate nutrition will be maintained Outcome: Adequate for Discharge   Problem: Coping: Goal: Level of anxiety will decrease Outcome: Adequate for Discharge   Problem: Elimination: Goal: Will not experience complications related to bowel motility Outcome: Adequate for Discharge Goal: Will not experience complications related to urinary retention Outcome: Adequate for Discharge   Problem: Pain Management: Goal: General experience of comfort will improve Outcome: Adequate for Discharge   Problem: Safety: Goal: Ability to remain free from injury will improve Outcome: Adequate for Discharge   Problem: Skin Integrity: Goal: Risk for impaired skin integrity will decrease Outcome: Adequate for Discharge   Problem: Education: Goal: Verbalization of understanding the information provided (i.e., activity precautions, restrictions, etc) will improve Outcome: Adequate for Discharge Goal: Individualized Educational Video(s) Outcome: Adequate for  Discharge   Problem: Activity: Goal: Ability to ambulate and perform ADLs will improve Outcome: Adequate for Discharge   Problem: Clinical Measurements: Goal: Postoperative complications will be avoided or minimized Outcome: Adequate for Discharge   Problem: Self-Concept: Goal: Ability to maintain and perform role responsibilities to the fullest extent possible will improve Outcome: Adequate for Discharge   Problem: Pain Management: Goal: Pain level will decrease Outcome: Adequate for Discharge   Problem: Increased Nutrient Needs (NI-5.1) Goal: Food and/or nutrient delivery Description: Individualized approach for food/nutrient provision. Outcome: Adequate for Discharge   Problem: Acute Rehab OT Goals (only OT should resolve) Goal: Pt. Will Perform Grooming Outcome: Adequate for Discharge Goal: Pt. Will Perform Lower Body Bathing Outcome: Adequate for Discharge Goal: Pt. Will Perform Lower Body Dressing Outcome: Adequate for Discharge Goal: Pt. Will Transfer To Toilet Outcome: Adequate for Discharge Goal: Pt. Will Perform Toileting-Clothing Manipulation Outcome: Adequate for Discharge Goal: Pt. Will Perform Tub/Shower Transfer Outcome: Adequate for Discharge   Problem: Acute Rehab PT Goals(only PT should resolve) Goal: Pt Will Go Supine/Side To Sit Outcome: Adequate for Discharge Goal: Patient Will Transfer Sit To/From Stand Outcome: Adequate for Discharge Goal: Pt Will Ambulate Outcome: Adequate for Discharge

## 2023-12-14 NOTE — Discharge Summary (Signed)
Physician Discharge Summary   Patient: Michelle Cisneros MRN: [id number] DOB: November 02, 1956  Admit date:     12/09/2023  Discharge date: 12/13/2023  Discharge Physician: Kathlen Mody   PCP: Jerl Mina, MD   Recommendations at discharge:  Please follow up with PCP in one week.  Please follow up with orthopedics as recommended.  Please check cbc and bmp in one week.   Discharge Diagnoses: Principal Problem:   Closed subcapital fracture of neck of left femur, initial encounter (HCC) Active Problems:   Essential hypertension   Hyperlipidemia   Depression   OSA (obstructive sleep apnea)  Resolved Problems:   * No resolved hospital problems. *  Hospital Course: [name] is a 67 y.o. female with medical history significant for HTN, HLD, pSVT, chronic hydrocephalus, lumbar spondylosis, OSA on CPAP who is admitted with acute left hip fracture after mechanical fall.  Assessment and Plan:   Acute left proximal femur fracture Orthopedics consulted s/p THA on 12/22 Therapy evaluations pending. Continue to monitor Pain control     Essential hypertension Blood pressure parameters are optimal     Hyperlipidemia Continue with statin       Obstructive sleep apnea On CPAP at home       Depression Continue with Wellbutrin     Mild leukocytosis Continue to monitor         Consultants: orthopedics.  Procedures performed: THA  Disposition: Home Diet recommendation:  Regular diet DISCHARGE MEDICATION: Allergies as of 12/13/2023   No Known Allergies      Medication List     STOP taking these medications    aspirin EC 81 MG tablet   nitrofurantoin (macrocrystal-monohydrate) 100 MG capsule Commonly known as: MACROBID   phenazopyridine 200 MG tablet Commonly known as: PYRIDIUM       TAKE these medications    atorvastatin 10 MG tablet Commonly known as: LIPITOR TAKE ONE TABLET BY MOUTH DAILY   buPROPion 300 MG 24 hr tablet Commonly known  as: WELLBUTRIN XL TAKE ONE TABLET BY MOUTH DAILY   Calcium Carbonate-Vitamin D 600-400 MG-UNIT tablet Take 1 tablet by mouth daily.   docusate sodium 100 MG capsule Commonly known as: COLACE Take 1 capsule (100 mg total) by mouth 2 (two) times daily as needed for mild constipation.   enoxaparin 40 MG/0.4ML injection Commonly known as: LOVENOX Inject 0.4 mLs (40 mg total) into the skin daily for 14 days.   fluticasone 50 MCG/ACT nasal spray Commonly known as: FLONASE Place 1 spray into both nostrils daily as needed for allergies.   lisinopril 20 MG tablet Commonly known as: ZESTRIL Take 1 tablet (20 mg total) by mouth daily.   metroNIDAZOLE 0.75 % cream Commonly known as: METROCREAM Apply to face once to twice daily for rosacea.   multivitamin with minerals Tabs tablet Take 1 tablet by mouth daily.   mupirocin ointment 2 % Commonly known as: BACTROBAN Place 1 Application into the nose 2 (two) times daily.   oxyCODONE-acetaminophen 5-325 MG tablet Commonly known as: Percocet Take 1-2 tablets by mouth 2 (two) times daily as needed for severe pain (pain score 7-10).   polyethylene glycol 17 g packet Commonly known as: MIRALAX / GLYCOLAX Take 17 g by mouth daily as needed for mild constipation.               Discharge Care Instructions  (From admission, onward)           Start     Ordered   12/12/23 0000  Weight bearing as tolerated        12/12/23 0912            Follow-up Information     Cristie Hem, PA-C Follow up in 2 week(s).   Specialty: Orthopedic Surgery Why: For suture removal, For wound re-check Contact information: [address] [place] [address] [phone]         [employer] Follow up.   Why: Rolling walker and bedside commode to be delivered to the room. Contact information: [employer] Address: [address], [place], [place] [address]        [facility] Follow up.   Specialty: Home  Health Services Why: Physical and Occupational Therapy-office to call with visit times. Contact information: [address] [place] [address] [phone]         Jerl Mina, MD. Schedule an appointment as soon as possible for a visit in 1 week(s).   Specialty: Family Medicine Contact information: [address] [place] [address] [phone]                Discharge Exam: Ceasar Mons Weights   12/09/23 2116 12/11/23 0714  Weight: 74.4 kg 74.4 kg   General exam: Appears calm and comfortable  Respiratory system: Clear to auscultation. Respiratory effort normal. Cardiovascular system: S1 & S2 heard, RRR.  Gastrointestinal system: Abdomen is nondistended, soft and nontender. Central nervous system: Alert and oriented. No focal neurological deficits. Extremities: Symmetric 5 x 5 power. Skin: No rashes, lesions or ulcers Psychiatry:Mood & affect appropriate.    Condition at discharge: fair  The results of significant diagnostics from this hospitalization (including imaging, microbiology, ancillary and laboratory) are listed below for reference.   Imaging Studies: DG Pelvis Portable Result Date: 12/11/2023 CLINICAL DATA:  Previous internal fixation of the left hip. EXAM: PORTABLE PELVIS 1-2 VIEWS COMPARISON:  12/09/2023 FINDINGS: Postoperative changes with a left total hip arthroplasty using non cemented femoral component and single screw fixing the acetabular component. Components appear well seated. Normal alignment. No acute fracture or dislocation. Degenerative changes are demonstrated in the right hip. IMPRESSION: Left total hip arthroplasty.  No acute complication is suggested. Electronically Signed   By: Burman Nieves M.D.   On: 12/11/2023 18:18   DG HIP UNILAT WITH PELVIS 1V LEFT Result Date: 12/11/2023 CLINICAL DATA:  Left femoral neck fracture. EXAM: DG HIP (WITH OR WITHOUT PELVIS) 1V*L* COMPARISON:  Femur radiographs  dated 12/17/2023. FINDINGS: Intraoperative fluoroscopy images demonstrate a total left hip arthroplasty. The hardware appears intact and well aligned. IMPRESSION: Intraoperative fluoroscopy images demonstrate a total left hip arthroplasty. Electronically Signed   By: Romona Curls M.D.   On: 12/11/2023 11:21   DG C-Arm 1-60 Min-No Report Result Date: 12/11/2023 Fluoroscopy was utilized by the requesting physician.  No radiographic interpretation.   DG C-Arm 1-60 Min-No Report Result Date: 12/11/2023 Fluoroscopy was utilized by the requesting physician.  No radiographic interpretation.   Chest Portable 1 View Result Date: 12/10/2023 CLINICAL DATA:  Preoperative examination. EXAM: PORTABLE CHEST 1 VIEW COMPARISON:  07/05/2021 FINDINGS: Normal heart size and pulmonary vascularity. No focal airspace disease or consolidation in the lungs. No blunting of costophrenic angles. No pneumothorax. Mediastinal contours appear intact. Degenerative changes in the spine and shoulders. IMPRESSION: No active disease. Electronically Signed   By: Burman Nieves M.D.   On: 12/10/2023 00:42   DG Femur Min 2 Views Left Result Date: 12/09/2023 CLINICAL DATA:  Status post fall. EXAM:  LEFT FEMUR 2 VIEWS COMPARISON:  None Available. FINDINGS: There is acute fracture deformity extending through the neck of the proximal left femur. There is no evidence of dislocation. Soft tissues are unremarkable. IMPRESSION: Acute fracture of the proximal left femur. Electronically Signed   By: Aram Candela M.D.   On: 12/09/2023 22:58   DG Pelvis Portable Result Date: 12/09/2023 CLINICAL DATA:  Status post fall. EXAM: PORTABLE PELVIS 1-2 VIEWS COMPARISON:  None Available. FINDINGS: There is an acute fracture extending through the neck of the proximal left femur. Approximately 1/2 shaft width dorsal displacement of the distal fracture site is seen. There is no evidence of dislocation. IMPRESSION: Acute fracture of the proximal left  femur. Electronically Signed   By: Aram Candela M.D.   On: 12/09/2023 22:57   DG Knee 2 Views Left Result Date: 12/09/2023 CLINICAL DATA:  Status post fall. EXAM: LEFT KNEE - 1-2 VIEW COMPARISON:  None Available. FINDINGS: No evidence of an acute fracture or dislocation. Large medial and lateral marginal osteophytes are seen. Moderate to marked severity tricompartmental joint space narrowing is noted. Multiple approximally 1.5 cm oval shaped soft tissue calcifications are seen adjacent to the lateral aspect of the distal left femoral shaft. A small joint effusion is suspected. IMPRESSION: 1. Marked severity degenerative changes. 2. Small joint effusion. Electronically Signed   By: Aram Candela M.D.   On: 12/09/2023 22:56    Microbiology: Results for orders placed or performed during the hospital encounter of 12/09/23  Surgical PCR screen     Status: None   Collection Time: 12/10/23  5:33 AM   Specimen: Nasal Mucosa; Nasal Swab  Result Value Ref Range Status   MRSA, PCR NEGATIVE NEGATIVE Final   Staphylococcus aureus NEGATIVE NEGATIVE Final    Comment: (NOTE) The Xpert SA Assay (FDA approved for NASAL specimens in patients 19 years of age and older), is one component of a comprehensive surveillance program. It is not intended to diagnose infection nor to guide or monitor treatment. Performed at [facility], [address] [place], [place] [address]     Labs: CBC: Recent Labs  Lab 12/09/23 2221 12/10/23 0607 12/11/23 1048 12/12/23 0437 12/13/23 0614  WBC 13.4* 10.4 14.8* 12.0* 9.5  NEUTROABS 12.2*  --   --   --   --   HGB 13.7 13.1 13.5 12.8 12.3  HCT 40.9 38.2 40.4 38.0 35.6*  MCV 90.9 89.0 91.6 89.8 90.1  PLT 255 233 218 210 211   Basic Metabolic Panel: Recent Labs  Lab 12/09/23 2221 12/10/23 0607 12/11/23 1048 12/12/23 0437 12/13/23 0614  NA 134* 136  --  131* 133*  K 3.9 3.9  --  4.1 4.0  CL 104 102  --  99 102  CO2 20* 25  --  22 25  GLUCOSE  113* 152*  --  118* 105*  BUN 12 11  --  14 13  CREATININE 0.65 0.86 0.58 0.59 0.64  CALCIUM 9.1 9.4  --  8.9 8.6*  MG  --   --   --  1.9  --    Liver Function Tests: Recent Labs  Lab 12/09/23 2221 12/12/23 0437  AST 46* 30  ALT 91* 41  ALKPHOS 71 62  BILITOT 0.9 1.3*  PROT 6.9 6.4*  ALBUMIN 3.8 3.1*   CBG: No results for input(s): "GLUCAP" in the last 168 hours.  Discharge time spent: 39 MINUTES.   Signed: Kathlen Mody, MD [facility]

## 2023-12-15 ENCOUNTER — Encounter: Payer: Self-pay | Admitting: Urology

## 2023-12-16 ENCOUNTER — Telehealth: Payer: Self-pay | Admitting: Orthopaedic Surgery

## 2023-12-16 DIAGNOSIS — Z9181 History of falling: Secondary | ICD-10-CM | POA: Diagnosis not present

## 2023-12-16 DIAGNOSIS — E785 Hyperlipidemia, unspecified: Secondary | ICD-10-CM | POA: Diagnosis not present

## 2023-12-16 DIAGNOSIS — S72012D Unspecified intracapsular fracture of left femur, subsequent encounter for closed fracture with routine healing: Secondary | ICD-10-CM | POA: Diagnosis not present

## 2023-12-16 DIAGNOSIS — D72829 Elevated white blood cell count, unspecified: Secondary | ICD-10-CM | POA: Diagnosis not present

## 2023-12-16 DIAGNOSIS — G4733 Obstructive sleep apnea (adult) (pediatric): Secondary | ICD-10-CM | POA: Diagnosis not present

## 2023-12-16 DIAGNOSIS — M47816 Spondylosis without myelopathy or radiculopathy, lumbar region: Secondary | ICD-10-CM | POA: Diagnosis not present

## 2023-12-16 DIAGNOSIS — M25462 Effusion, left knee: Secondary | ICD-10-CM | POA: Diagnosis not present

## 2023-12-16 DIAGNOSIS — M1712 Unilateral primary osteoarthritis, left knee: Secondary | ICD-10-CM | POA: Diagnosis not present

## 2023-12-16 DIAGNOSIS — F32A Depression, unspecified: Secondary | ICD-10-CM | POA: Diagnosis not present

## 2023-12-16 DIAGNOSIS — Z4889 Encounter for other specified surgical aftercare: Secondary | ICD-10-CM | POA: Diagnosis not present

## 2023-12-16 DIAGNOSIS — Z96642 Presence of left artificial hip joint: Secondary | ICD-10-CM | POA: Diagnosis not present

## 2023-12-16 DIAGNOSIS — Z87891 Personal history of nicotine dependence: Secondary | ICD-10-CM | POA: Diagnosis not present

## 2023-12-16 DIAGNOSIS — I10 Essential (primary) hypertension: Secondary | ICD-10-CM | POA: Diagnosis not present

## 2023-12-16 DIAGNOSIS — I471 Supraventricular tachycardia, unspecified: Secondary | ICD-10-CM | POA: Diagnosis not present

## 2023-12-16 NOTE — Telephone Encounter (Signed)
Frances Furbish called requesting verbal orders for HHPT 2w2 1w4 starting next week, also needs clarification for hip precautions please advise call Amy at [phone], requesting a CB today being she is going on vacation [employer]

## 2023-12-16 NOTE — Telephone Encounter (Signed)
 No precautions

## 2023-12-16 NOTE — Telephone Encounter (Signed)
Any hip precautions for therapist?

## 2023-12-16 NOTE — Telephone Encounter (Signed)
Tried to call twice to give verbal. No answer. No voicemail at number provided in message.

## 2023-12-19 ENCOUNTER — Telehealth: Payer: Self-pay | Admitting: Orthopaedic Surgery

## 2023-12-19 NOTE — Telephone Encounter (Signed)
Patient called and wants a refill on Oxycodone with tylenol. CB#[phone]

## 2023-12-20 ENCOUNTER — Other Ambulatory Visit: Payer: Self-pay | Admitting: Physician Assistant

## 2023-12-20 MED ORDER — HYDROCODONE-ACETAMINOPHEN 5-325 MG PO TABS: 1.0000 | ORAL_TABLET | Freq: Two times a day (BID) | ORAL | 0 refills | Status: DC | PRN

## 2023-12-20 NOTE — Telephone Encounter (Signed)
Weaning to norco and I just sent in

## 2023-12-20 NOTE — Telephone Encounter (Signed)
Sent her mychart msg. ? ?

## 2023-12-23 ENCOUNTER — Other Ambulatory Visit: Payer: Self-pay | Admitting: Orthopaedic Surgery

## 2023-12-23 ENCOUNTER — Other Ambulatory Visit: Payer: Self-pay | Admitting: Physician Assistant

## 2023-12-23 MED ORDER — HYDROCODONE-ACETAMINOPHEN 5-325 MG PO TABS: 1.0000 | ORAL_TABLET | Freq: Two times a day (BID) | ORAL | 0 refills | Status: DC | PRN

## 2023-12-23 NOTE — Telephone Encounter (Signed)
 We weaned from oxy to norco and I sent in on 12/31

## 2023-12-26 DIAGNOSIS — G919 Hydrocephalus, unspecified: Secondary | ICD-10-CM | POA: Diagnosis not present

## 2023-12-26 DIAGNOSIS — Z87440 Personal history of urinary (tract) infections: Secondary | ICD-10-CM | POA: Diagnosis not present

## 2023-12-27 ENCOUNTER — Ambulatory Visit: Payer: PPO | Admitting: Cardiology

## 2023-12-27 ENCOUNTER — Telehealth: Payer: Self-pay

## 2023-12-27 DIAGNOSIS — G4733 Obstructive sleep apnea (adult) (pediatric): Secondary | ICD-10-CM

## 2023-12-27 NOTE — Progress Notes (Signed)
 This encounter was created in error - please disregard.

## 2023-12-27 NOTE — Telephone Encounter (Signed)
 Call to patient to discuss today's video visit for 3 pm. No answer, left message per DPR asking patient to call our office to discuss.  Needing to cancel appt as patient was supposed to have cpap titration that was never completed. Cpap titration recommendations forwarded to N. Joshua in sleep studies pool.

## 2023-12-27 NOTE — Addendum Note (Signed)
 Addended by: Luellen Pucker on: 12/27/2023 10:28 AM   Modules accepted: Orders

## 2023-12-28 DIAGNOSIS — G4733 Obstructive sleep apnea (adult) (pediatric): Secondary | ICD-10-CM | POA: Diagnosis not present

## 2023-12-28 DIAGNOSIS — Q039 Congenital hydrocephalus, unspecified: Secondary | ICD-10-CM | POA: Diagnosis not present

## 2023-12-28 DIAGNOSIS — Z8679 Personal history of other diseases of the circulatory system: Secondary | ICD-10-CM | POA: Diagnosis not present

## 2023-12-29 ENCOUNTER — Encounter: Payer: Self-pay | Admitting: Orthopaedic Surgery

## 2023-12-29 ENCOUNTER — Ambulatory Visit (INDEPENDENT_AMBULATORY_CARE_PROVIDER_SITE_OTHER): Payer: PPO | Admitting: Orthopaedic Surgery

## 2023-12-29 ENCOUNTER — Other Ambulatory Visit (INDEPENDENT_AMBULATORY_CARE_PROVIDER_SITE_OTHER): Payer: Self-pay

## 2023-12-29 DIAGNOSIS — Z96642 Presence of left artificial hip joint: Secondary | ICD-10-CM | POA: Diagnosis not present

## 2023-12-29 MED ORDER — ENOXAPARIN SODIUM 40 MG/0.4ML IJ SOSY: 40.0000 mg | PREFILLED_SYRINGE | Freq: Every day | INTRAMUSCULAR | 0 refills | Status: DC

## 2023-12-29 NOTE — Progress Notes (Signed)
 Post-Op Visit Note   Patient: Michelle Cisneros           Date of Birth: Jun 26, 1956           MRN: [id number] Visit Date: 12/29/2023 PCP: Valora Agent, MD   Assessment & Plan:  Chief Complaint:  Chief Complaint  Patient presents with   Left Hip - Follow-up    Left total hip arthroplasty 12/11/2023   Visit Diagnoses:  1. S/P total left hip arthroplasty     Plan: Patient is a very pleasant 68 year old female who comes in today 2 weeks status post left total hip replacement from a femoral neck fracture, date of surgery 12/11/2023.  She has been doing great.  She has been in some pain and has been taking Norco.  She has been getting home health physical therapy and is ambulating with a walker as well as a cane at times.  Overall, doing great.  Examination of her left hip: Well-healing surgical incision.  No evidence of infection or cellulitis.  Calves are soft nontender.  She is neurovascularly intact distally.  Today, sutures were removed and Steri-Strips applied.  Continue with home health physical therapy.  She is asked me about traveling to [place] next Thursday for her daughter's wedding.  We have discussed with her that it is not recommended from our standpoint based on increased risk of DVT but I believe she is okay assuming the risk.  I have extended her Lovenox  for another 2 weeks and have recommended compression socks as well as calf pumps.  Follow-up in 4 weeks for repeat evaluation and AP pelvis x-rays.  Call with concerns or questions.  Follow-Up Instructions: Return in about 4 weeks (around 01/26/2024).   Orders:  Orders Placed This Encounter  Procedures   XR FEMUR MIN 2 VIEWS LEFT   Meds ordered this encounter  Medications   enoxaparin  (LOVENOX ) 40 MG/0.4ML injection    Sig: Inject 0.4 mLs (40 mg total) into the skin daily for 14 days.    Dispense:  5.6 mL    Refill:  0    Imaging: XR FEMUR MIN 2 VIEWS LEFT Result Date: 12/29/2023 Well-seated prosthesis  without complication   PMFS History: Patient Active Problem List   Diagnosis Date Noted   Closed subcapital fracture of neck of left femur, initial encounter (HCC) 12/09/2023   PSVT (paroxysmal supraventricular tachycardia) (HCC) 05/27/2023   Recurrent falls 08/16/2022   Hydrocephalus (HCC) 08/16/2022   Depression 08/16/2022   OSA (obstructive sleep apnea) 04/27/2022   Palpitations 10/03/2021   Dyspnea on exertion 04/19/2020   Aortic atherosclerosis (HCC) 04/19/2020   Essential hypertension 04/19/2020   Hyperlipidemia 04/19/2020   Right carpal tunnel syndrome 11/09/2016   Past Medical History:  Diagnosis Date   Aortic atherosclerosis (HCC)    Arthritis    Back pain    Basal cell carcinoma    L forehead, txted in past by another provider   Collagen vascular disease (HCC)    Depression    DOE (dyspnea on exertion)    a. 05/2020 Echo: EF 60-65%, no rwma, nl RV size/fxn.   History of stress test    a. 04/2020 Lexiscan  MV: No ischemia/infarct.   Hyperlipemia    Hypertension    Melanoma (HCC) ~2015   R forearm txted in past by another provider   Recurrent UTI    Squamous cell carcinoma of skin    L med ankle, txted in past by another provider    Family History  Problem  Relation Age of Onset   Cancer Father        laryngeal   Atrial fibrillation Father    Stroke Mother    Breast cancer Neg Hx    Bladder Cancer Neg Hx    Kidney cancer Neg Hx     Past Surgical History:  Procedure Laterality Date   ABDOMINAL HYSTERECTOMY     COLONOSCOPY WITH PROPOFOL  N/A 05/26/2020   Procedure: COLONOSCOPY WITH PROPOFOL ;  Surgeon: Toledo, Ladell POUR, MD;  Location: [facility];  Service: Gastroenterology;  Laterality: N/A;   INCONTINENCE SURGERY     OOPHORECTOMY     TOTAL HIP ARTHROPLASTY Left 12/11/2023   Procedure: TOTAL HIP ARTHROPLASTY ANTERIOR APPROACH;  Surgeon: Jerri Kay HERO, MD;  Location: [facility] OR;  Service: Orthopedics;  Laterality: Left;   Social History   Occupational  History   Not on file  Tobacco Use   Smoking status: Former    Current packs/day: 0.00    Average packs/day: 1 pack/day for 30.0 years (30.0 ttl pk-yrs)    Types: Cigarettes    Start date: 10/03/1957    Quit date: 10/04/1987    Years since quitting: 36.2    Passive exposure: Past   Smokeless tobacco: Never  Vaping Use   Vaping status: Never Used  Substance and Sexual Activity   Alcohol use: Yes    Comment: occassional; once glass of wine   Drug use: Not Currently   Sexual activity: Not on file

## 2024-01-12 ENCOUNTER — Encounter: Payer: Self-pay | Admitting: Orthopaedic Surgery

## 2024-01-12 ENCOUNTER — Other Ambulatory Visit (INDEPENDENT_AMBULATORY_CARE_PROVIDER_SITE_OTHER): Payer: Self-pay

## 2024-01-12 ENCOUNTER — Ambulatory Visit: Payer: PPO | Admitting: Orthopaedic Surgery

## 2024-01-12 DIAGNOSIS — M1712 Unilateral primary osteoarthritis, left knee: Secondary | ICD-10-CM | POA: Diagnosis not present

## 2024-01-12 DIAGNOSIS — Z96642 Presence of left artificial hip joint: Secondary | ICD-10-CM | POA: Diagnosis not present

## 2024-01-12 NOTE — Progress Notes (Signed)
Post-Op Visit Note   Patient: Michelle Cisneros           Date of Birth: Nov 10, 1956           MRN: [id number] Visit Date: 01/12/2024 PCP: Jerl Mina, MD   Assessment & Plan:  Chief Complaint:  Chief Complaint  Patient presents with   Left Hip - Follow-up    Left total hip arthroplasty 12/11/2023   Visit Diagnoses:  1. S/P total left hip arthroplasty   2. Primary osteoarthritis of left knee     Plan: Patient is following up today for postop check and evaluation of separate issue of left knee pain.  Overall things are going well but she has noticed increased pain in her left knee.  Examination of the left hip shows fully surgical scar with fluid painless range of motion.  Examination of the left knee shows no joint effusion.  Crepitus with range of motion.  Her knee is quite degenerative and I think this is exacerbated by the recent hip injury and surgery.  I would expect this to resolve as she recovers from the hip.  She politely declined cortisone injection today.  Tylenol helps with the knee symptoms.  Recheck in 6 weeks.  Follow-Up Instructions: Return in about 6 weeks (around 02/23/2024) for with lindsey.   Orders:  Orders Placed This Encounter  Procedures   XR Pelvis 1-2 Views   No orders of the defined types were placed in this encounter.   Imaging: XR Pelvis 1-2 Views Result Date: 01/12/2024 Stable total hip replacement without complications   PMFS History: Patient Active Problem List   Diagnosis Date Noted   Closed subcapital fracture of neck of left femur, initial encounter (HCC) 12/09/2023   PSVT (paroxysmal supraventricular tachycardia) (HCC) 05/27/2023   Recurrent falls 08/16/2022   Hydrocephalus (HCC) 08/16/2022   Depression 08/16/2022   OSA (obstructive sleep apnea) 04/27/2022   Palpitations 10/03/2021   Dyspnea on exertion 04/19/2020   Aortic atherosclerosis (HCC) 04/19/2020   Essential hypertension 04/19/2020   Hyperlipidemia 04/19/2020    Right carpal tunnel syndrome 11/09/2016   Past Medical History:  Diagnosis Date   Aortic atherosclerosis (HCC)    Arthritis    Back pain    Basal cell carcinoma    L forehead, txted in past by another provider   Collagen vascular disease (HCC)    Depression    DOE (dyspnea on exertion)    a. 05/2020 Echo: EF 60-65%, no rwma, nl RV size/fxn.   History of stress test    a. 04/2020 Lexiscan MV: No ischemia/infarct.   Hyperlipemia    Hypertension    Melanoma (HCC) ~2015   R forearm txted in past by another provider   Recurrent UTI    Squamous cell carcinoma of skin    L med ankle, txted in past by another provider    Family History  Problem Relation Age of Onset   Cancer Father        laryngeal   Atrial fibrillation Father    Stroke Mother    Breast cancer Neg Hx    Bladder Cancer Neg Hx    Kidney cancer Neg Hx     Past Surgical History:  Procedure Laterality Date   ABDOMINAL HYSTERECTOMY     COLONOSCOPY WITH PROPOFOL N/A 05/26/2020   Procedure: COLONOSCOPY WITH PROPOFOL;  Surgeon: Toledo, Boykin Nearing, MD;  Location: [facility];  Service: Gastroenterology;  Laterality: N/A;   INCONTINENCE SURGERY     OOPHORECTOMY  TOTAL HIP ARTHROPLASTY Left 12/11/2023   Procedure: TOTAL HIP ARTHROPLASTY ANTERIOR APPROACH;  Surgeon: Tarry Kos, MD;  Location: [facility] OR;  Service: Orthopedics;  Laterality: Left;   Social History   Occupational History   Not on file  Tobacco Use   Smoking status: Former    Current packs/day: 0.00    Average packs/day: 1 pack/day for 30.0 years (30.0 ttl pk-yrs)    Types: Cigarettes    Start date: 10/03/1957    Quit date: 10/04/1987    Years since quitting: 36.2    Passive exposure: Past   Smokeless tobacco: Never  Vaping Use   Vaping status: Never Used  Substance and Sexual Activity   Alcohol use: Yes    Comment: occassional; once glass of wine   Drug use: Not Currently   Sexual activity: Not on file

## 2024-01-16 ENCOUNTER — Ambulatory Visit: Payer: PPO | Admitting: Physician Assistant

## 2024-01-16 ENCOUNTER — Encounter: Payer: Self-pay | Admitting: Physician Assistant

## 2024-01-16 VITALS — Ht 66.0 in | Wt 165.0 lb

## 2024-01-16 DIAGNOSIS — M810 Age-related osteoporosis without current pathological fracture: Secondary | ICD-10-CM | POA: Insufficient documentation

## 2024-01-16 DIAGNOSIS — M8000XA Age-related osteoporosis with current pathological fracture, unspecified site, initial encounter for fracture: Secondary | ICD-10-CM

## 2024-01-16 NOTE — Progress Notes (Signed)
Office Visit Note   Patient: Michelle Cisneros           Date of Birth: [birth year]           MRN: [id number] Visit Date: 01/16/2024              Requested by: Michelle Kos, MD [address] [place],  [place] [address] PCP: Jerl Mina, MD   Assessment & Plan: Visit Diagnoses:  1. Age-related osteoporosis without current pathological fracture     Plan: Patient is a pleasant 68 year old woman who is referred from Dr.Xu.  She is status post left total hip arthroplasty after she states sustained a left femoral neck fracture in December.  She thinks she has a history of lumbar fractures as well.  She has no history of heart attack cancer kidney disease ulcers bypass surgery reflux or seizures.  She went through menopause when she was 48 she thinks she is about 1-1/2 inches shorter than she wants was.  She is taking calcium and vitamin D as recommended by her primary care.  She is never done hormone replacement therapy she is a former smoker.  She does not drink she is working sit with PT now but she does normally like to go to the gym.  She had thyroid lab last summer which was normal.  She was recently hospitalized for her hip surgery she has good calcium and vitamin D readings.  Based on her appearance she is osteoporotic given her recent fractures.  She has not had a bone density scan in quite a while.  I recommend this.  Given her activity level and age and health I would recommend [facility] followed by [facility].  Will get her bone density scan and contact her with the results  Follow-Up Instructions: Will call after bone DEXA scan  Orders:  No orders of the defined types were placed in this encounter.  No orders of the defined types were placed in this encounter.     Procedures: No procedures performed   Clinical Data: No additional findings.   Subjective: Chief Complaint  Patient presents with   Osteoporosis    HPI pleasant 68 year old woman comes in today for  evaluation for osteoporosis.  She is active and lives with her husband.  She enjoys normally going to the gym.  Unfortunately she sustained a femoral neck fracture in her left hip and December and subsequently had hip replacement  Review of Systems  All other systems reviewed and are negative.    Objective: Vital Signs: Ht 5\' 6"  (1.676 m)   Wt 165 lb (74.8 kg)   BMI 26.63 kg/m   Physical Exam Constitutional:      Appearance: Normal appearance.  Pulmonary:     Effort: Pulmonary effort is normal.  Skin:    General: Skin is warm and dry.  Neurological:     General: No focal deficit present.     Mental Status: She is alert and oriented to person, place, and time.  Psychiatric:        Mood and Affect: Mood normal.        Behavior: Behavior normal.       Specialty Comments:  No specialty comments available.  Imaging: No results found.   PMFS History: Patient Active Problem List   Diagnosis Date Noted   Age-related osteoporosis without current pathological fracture 01/16/2024   Closed subcapital fracture of neck of left femur, initial encounter (HCC) 12/09/2023   PSVT (paroxysmal supraventricular tachycardia) (HCC) 05/27/2023  Recurrent falls 08/16/2022   Hydrocephalus (HCC) 08/16/2022   Depression 08/16/2022   OSA (obstructive sleep apnea) 04/27/2022   Palpitations 10/03/2021   Dyspnea on exertion 04/19/2020   Aortic atherosclerosis (HCC) 04/19/2020   Essential hypertension 04/19/2020   Hyperlipidemia 04/19/2020   Right carpal tunnel syndrome 11/09/2016   Past Medical History:  Diagnosis Date   Aortic atherosclerosis (HCC)    Arthritis    Back pain    Basal cell carcinoma    L forehead, txted in past by another provider   Collagen vascular disease (HCC)    Depression    DOE (dyspnea on exertion)    a. 05/2020 Echo: EF 60-65%, no rwma, nl RV size/fxn.   History of stress test    a. 04/2020 Lexiscan MV: No ischemia/infarct.   Hyperlipemia    Hypertension     Melanoma (HCC) ~2015   R forearm txted in past by another provider   Recurrent UTI    Squamous cell carcinoma of skin    L med ankle, txted in past by another provider    Family History  Problem Relation Age of Onset   Cancer Father        laryngeal   Atrial fibrillation Father    Stroke Mother    Breast cancer Neg Hx    Bladder Cancer Neg Hx    Kidney cancer Neg Hx     Past Surgical History:  Procedure Laterality Date   ABDOMINAL HYSTERECTOMY     COLONOSCOPY WITH PROPOFOL N/A 05/26/2020   Procedure: COLONOSCOPY WITH PROPOFOL;  Surgeon: Toledo, Boykin Nearing, MD;  Location: [facility] ENDOSCOPY;  Service: Gastroenterology;  Laterality: N/A;   INCONTINENCE SURGERY     OOPHORECTOMY     TOTAL HIP ARTHROPLASTY Left 12/11/2023   Procedure: TOTAL HIP ARTHROPLASTY ANTERIOR APPROACH;  Surgeon: Michelle Kos, MD;  Location: [facility] OR;  Service: Orthopedics;  Laterality: Left;   Social History   Occupational History   Not on file  Tobacco Use   Smoking status: Former    Current packs/day: 0.00    Average packs/day: 1 pack/day for 30.0 years (30.0 ttl pk-yrs)    Types: Cigarettes    Start date: 10/03/1957    Quit date: 10/04/1987    Years since quitting: 36.3    Passive exposure: Past   Smokeless tobacco: Never  Vaping Use   Vaping status: Never Used  Substance and Sexual Activity   Alcohol use: Yes    Comment: occassional; once glass of wine   Drug use: Not Currently   Sexual activity: Not on file

## 2024-01-17 ENCOUNTER — Encounter: Payer: Self-pay | Admitting: Orthopaedic Surgery

## 2024-01-17 ENCOUNTER — Other Ambulatory Visit: Payer: Self-pay

## 2024-01-17 DIAGNOSIS — Z96642 Presence of left artificial hip joint: Secondary | ICD-10-CM

## 2024-01-17 NOTE — Telephone Encounter (Signed)
Let's send her to outpatient PT.  Thanks.

## 2024-01-17 NOTE — Telephone Encounter (Signed)
Referral has been placed.

## 2024-01-23 ENCOUNTER — Ambulatory Visit: Payer: PPO | Admitting: Dermatology

## 2024-01-23 DIAGNOSIS — W908XXA Exposure to other nonionizing radiation, initial encounter: Secondary | ICD-10-CM

## 2024-01-23 DIAGNOSIS — R238 Other skin changes: Secondary | ICD-10-CM

## 2024-01-23 DIAGNOSIS — T24201A Burn of second degree of unspecified site of right lower limb, except ankle and foot, initial encounter: Secondary | ICD-10-CM | POA: Diagnosis not present

## 2024-01-23 DIAGNOSIS — B070 Plantar wart: Secondary | ICD-10-CM | POA: Diagnosis not present

## 2024-01-23 DIAGNOSIS — L820 Inflamed seborrheic keratosis: Secondary | ICD-10-CM

## 2024-01-23 DIAGNOSIS — L578 Other skin changes due to chronic exposure to nonionizing radiation: Secondary | ICD-10-CM

## 2024-01-23 DIAGNOSIS — L821 Other seborrheic keratosis: Secondary | ICD-10-CM | POA: Diagnosis not present

## 2024-01-23 NOTE — Progress Notes (Signed)
Follow-Up Visit   Subjective  Michelle Cisneros is a 68 y.o. female who presents for the following: 3 month follow-up ISKs of the left forehead, right upper temple, and mid crown scalp. She has an itchy spot on her back she would like checked. She also has a blister on her thigh from a hot tea burn. The patient also has a spot on her left plantar foot, painful when walks. She recently had a hip replacement in December.  The patient has spots, moles and lesions to be evaluated, some may be new or changing.   The following portions of the chart were reviewed this encounter and updated as appropriate: medications, allergies, medical history  Review of Systems:  No other skin or systemic complaints except as noted in HPI or Assessment and Plan.  Objective  Well appearing patient in no apparent distress; mood and affect are within normal limits.  A focused examination was performed of the following areas: Face, scalp  Relevant physical exam findings are noted in the Assessment and Plan.  Spinal Mid Back x 5, L upper forehead x 1 (residual) (6) Erythematous stuck-on, waxy papule or plaque at back; waxy pink macule at left upper forehead, scalp clear, R temple residual waxy tan patch (not bothersome) L plantar foot at ball 4.0 mm firm hyperkeratotic papule, dark violaceous macule deep after paring -- Discussed viral etiology and contagion.   Assessment & Plan   SEBORRHEIC KERATOSIS - Stuck-on, waxy, tan-brown papules and/or plaques  - Benign-appearing - Discussed benign etiology and prognosis. - Observe - Call for any changes  ACTINIC DAMAGE - chronic, secondary to cumulative UV radiation exposure/sun exposure over time - diffuse scaly erythematous macules with underlying dyspigmentation - Recommend daily broad spectrum sunscreen SPF 30+ to sun-exposed areas, reapply every 2 hours as needed.  - Recommend staying in the shade or wearing long sleeves, sun glasses (UVA+UVB protection)  and wide brim hats (4-inch brim around the entire circumference of the hat). - Call for new or changing lesions.   INFLAMED SEBORRHEIC KERATOSIS (6) Spinal Mid Back x 5, L upper forehead x 1 (residual) (6) Symptomatic, irritating, patient would like treated. Destruction of lesion - Spinal Mid Back x 5, L upper forehead x 1 (residual) (6)  Destruction method: cryotherapy   Informed consent: discussed and consent obtained   Lesion destroyed using liquid nitrogen: Yes   Region frozen until ice ball extended beyond lesion: Yes   Outcome: patient tolerated procedure well with no complications   Post-procedure details: wound care instructions given   Additional details:  Prior to procedure, discussed risks of blister formation, small wound, skin dyspigmentation, or rare scar following cryotherapy. Recommend Vaseline ointment to treated areas while healing.  PLANTAR WART L plantar foot at ball With bleeding, Recheck on follow-up. Discussed biopsy on f/u if no change.  Viral Wart (HPV) Counseling  Discussed viral / HPV (Human Papilloma Virus) etiology and risk of spread /infectivity to other areas of body as well as to other people.  Multiple treatments and methods may be required to clear warts and it is possible treatment may not be successful.  Treatment risks include discoloration; scarring and there is still potential for wart recurrence.  Recommend using Curad Mediplast pads. Cut to fit wart or callus. Cover with Elastoplast waterproof tape or any waterproof band-aid. Change every 3 to 4 days, or sooner if necessary.  Treatment may require several months of regular use before results are seen.  Destruction of lesion - L plantar  foot at ball  Destruction method: cryotherapy   Destruction method comment:  Paring performed prior to cryotherapy. Informed consent: discussed and consent obtained   Lesion destroyed using liquid nitrogen: Yes   Region frozen until ice ball extended beyond  lesion: Yes   Outcome: patient tolerated procedure well with no complications   Post-procedure details: wound care instructions given   Additional details:  Prior to procedure, discussed risks of blister formation, small wound, skin dyspigmentation, or rare scar following cryotherapy. Recommend Vaseline ointment to treated areas while healing.  SEBORRHEIC KERATOSIS   BULLAE    Bullae secondary to burn, healing  Exam: Pink eroded bulla at right lower hip  Treatment: Recommend OTC Aquaphor and Bandage daily.  Return in about 1 month (around 02/20/2024) for wart.  ICherlyn Labella, CMA, am acting as scribe for Willeen Niece, MD .   Documentation: I have reviewed the above documentation for accuracy and completeness, and I agree with the above.  Willeen Niece, MD

## 2024-01-23 NOTE — Patient Instructions (Addendum)
Cryotherapy Aftercare  Wash gently with soap and water everyday.   Apply Vaseline and Band-Aid daily until healed.   Recommend using Curad Mediplast pads. Cut to fit wart or callus. Cover with Elastoplast waterproof tape or any waterproof band-aid. Change every 3 to 4 days, or sooner if necessary.  Treatment may require several months of regular use before results are seen.  Viral Warts   Viral warts are growths of the skin caused by viral infection of the skin. If you have been given the diagnosis of viral warts or molluscum contagiosum there are a few things that you must understand about your condition:  There is no guaranteed treatment method available for this condition. Multiple treatments may be required, The treatments may be time consuming and require multiple visits to the dermatology office. The treatment may be expensive. You will be charged each time you come into the office to have the spots treated. The treated areas may develop new lesions further complicating treatment. The treated areas may leave a scar. There is no guarantee that even after multiple treatments that the spots will be successfully treated. These are caused by a viral infection and can be spread to other areas of the skin and to other people by direct contact. Therefore, new spots may occur.   Due to recent changes in healthcare laws, you may see results of your pathology and/or laboratory studies on MyChart before the doctors have had a chance to review them. We understand that in some cases there may be results that are confusing or concerning to you. Please understand that not all results are received at the same time and often the doctors may need to interpret multiple results in order to provide you with the best plan of care or course of treatment. Therefore, we ask that you please give [place] 2 business days to thoroughly review all your results before contacting the office for clarification. Should we see a  critical lab result, you will be contacted sooner.   If You Need Anything After Your Visit  If you have any questions or concerns for your doctor, please call our main line at [phone] and press option 4 to reach your doctor's medical assistant. If no one answers, please leave a voicemail as directed and we will return your call as soon as possible. Messages left after 4 pm will be answered the following business day.   You may also send [place] a message via MyChart. We typically respond to MyChart messages within 1-2 business days.  For prescription refills, please ask your pharmacy to contact our office. Our fax number is [phone].  If you have an urgent issue when the clinic is closed that cannot wait until the next business day, you can page your doctor at the number below.    Please note that while we do our best to be available for urgent issues outside of office hours, we are not available 24/7.   If you have an urgent issue and are unable to reach [place], you may choose to seek medical care at your doctor's office, retail clinic, urgent care center, or emergency room.  If you have a medical emergency, please immediately call 911 or go to the emergency department.  Pager Numbers  - Dr. Gwen Pounds: [phone]  - Dr. Roseanne Reno: [phone]  - Dr. Katrinka Blazing: [phone]   In the event of inclement weather, please call our main line at [phone] for an update on the status of any delays or closures.  Dermatology  Medication Tips: Please keep the boxes that topical medications come in in order to help keep track of the instructions about where and how to use these. Pharmacies typically print the medication instructions only on the boxes and not directly on the medication tubes.   If your medication is too expensive, please contact our office at [phone] option 4 or send [place] a message through MyChart.   We are unable to tell what your co-pay for medications will be in advance as  this is different depending on your insurance coverage. However, we may be able to find a substitute medication at lower cost or fill out paperwork to get insurance to cover a needed medication.   If a prior authorization is required to get your medication covered by your insurance company, please allow [place] 1-2 business days to complete this process.  Drug prices often vary depending on where the prescription is filled and some pharmacies may offer cheaper prices.  The website www.goodrx.com contains coupons for medications through different pharmacies. The prices here do not account for what the cost may be with help from insurance (it may be cheaper with your insurance), but the website can give you the price if you did not use any insurance.  - You can print the associated coupon and take it with your prescription to the pharmacy.  - You may also stop by our office during regular business hours and pick up a GoodRx coupon card.  - If you need your prescription sent electronically to a different pharmacy, notify our office through [facility] or by phone at [phone] option 4.     Si Usted Necesita Algo Despus de Su Visita  Tambin puede enviarnos un mensaje a travs de Clinical cytogeneticist. Por lo general respondemos a los mensajes de MyChart en el transcurso de 1 a 2 das hbiles.  Para renovar recetas, por favor pida a su farmacia que se ponga en contacto con nuestra oficina. Annie Sable de fax es [place] [phone].  Si tiene un asunto urgente cuando la clnica est cerrada y que no puede esperar hasta el siguiente da hbil, puede llamar/localizar a su doctor(a) al nmero que aparece a continuacin.   Por favor, tenga en cuenta que aunque hacemos todo lo posible para estar disponibles para asuntos urgentes fuera del horario de [place], no estamos disponibles las 24 horas del da, los 7 [address]  [address] de la [place].   Si tiene un problema urgente y no puede comunicarse con nosotros, puede optar por  buscar atencin mdica  en el consultorio de su doctor(a), en una clnica privada, en un centro de atencin urgente o en una sala de emergencias.  Si tiene Engineer, drilling, por favor llame inmediatamente al 911 o vaya a la sala de emergencias.  Nmeros de bper  - Dr. Gwen Pounds: [phone]  - Dra. Roseanne Reno: [phone]  - Dr. Katrinka Blazing: [phone]   En caso de inclemencias del tiempo, por favor llame a Lacy Duverney principal al [phone] para una actualizacin sobre el [place] de cualquier retraso o cierre.  Consejos para la medicacin en dermatologa: Por favor, guarde las cajas en las que vienen los medicamentos de uso tpico para ayudarle a seguir las instrucciones sobre dnde y cmo usarlos. Las farmacias generalmente imprimen las instrucciones del medicamento slo en las cajas y no directamente en los tubos del [place].   Si su medicamento es muy caro, por favor, pngase en contacto con Rolm Gala llamando al [phone] y presione la opcin 4 o envenos un  mensaje a travs de MyChart.   No podemos decirle cul ser su copago por los medicamentos por adelantado ya que esto es diferente dependiendo de la cobertura de su seguro. Sin embargo, es posible que podamos encontrar un medicamento sustituto a Audiological scientist un formulario para que el seguro cubra el medicamento que se considera necesario.   Si se requiere una autorizacin previa para que su compaa de seguros [place] su medicamento, por favor permtanos de 1 a 2 das hbiles para completar [address]  Los precios de los medicamentos varan con frecuencia dependiendo del Environmental consultant de dnde se surte la receta y alguna farmacias pueden ofrecer precios ms baratos.  El sitio web www.goodrx.com tiene cupones para medicamentos de Health and safety inspector. Los precios aqu no tienen en cuenta lo que podra costar con la ayuda del seguro (puede ser ms barato con su seguro), pero el sitio web puede darle el precio si no  utiliz Tourist information centre manager.  - Puede imprimir el cupn correspondiente y llevarlo con su receta a la farmacia.  - Tambin puede pasar por nuestra oficina durante el horario de atencin regular y Education officer, museum una tarjeta de cupones de GoodRx.  - Si necesita que su receta se enve electrnicamente a una farmacia diferente, informe a nuestra oficina a travs de MyChart de [place] o por telfono llamando al [phone] y presione la opcin 4.

## 2024-01-25 ENCOUNTER — Ambulatory Visit: Payer: PPO | Attending: Podiatry

## 2024-01-25 DIAGNOSIS — R262 Difficulty in walking, not elsewhere classified: Secondary | ICD-10-CM | POA: Diagnosis not present

## 2024-01-25 DIAGNOSIS — M545 Low back pain, unspecified: Secondary | ICD-10-CM | POA: Insufficient documentation

## 2024-01-25 DIAGNOSIS — M25552 Pain in left hip: Secondary | ICD-10-CM | POA: Diagnosis not present

## 2024-01-25 DIAGNOSIS — G8929 Other chronic pain: Secondary | ICD-10-CM | POA: Insufficient documentation

## 2024-01-25 DIAGNOSIS — R2689 Other abnormalities of gait and mobility: Secondary | ICD-10-CM | POA: Insufficient documentation

## 2024-01-25 DIAGNOSIS — R269 Unspecified abnormalities of gait and mobility: Secondary | ICD-10-CM | POA: Diagnosis not present

## 2024-01-25 DIAGNOSIS — Z96642 Presence of left artificial hip joint: Secondary | ICD-10-CM | POA: Diagnosis not present

## 2024-01-25 NOTE — Therapy (Signed)
 OUTPATIENT PHYSICAL THERAPY LOWER EXTREMITY EVALUATION   Patient Name: Michelle Cisneros MRN: [id number] DOB:08-05-1956, 68 y.o., female Today's Date: 01/25/2024  END OF SESSION:  PT End of Session - 01/25/24 0903     Visit Number 1    Number of Visits 17    Date for PT Re-Evaluation 03/23/24    PT Start Time 0903    PT Stop Time 0944    PT Time Calculation (min) 41 min    Activity Tolerance Patient tolerated treatment well    Behavior During Therapy [facility] for tasks assessed/performed             Past Medical History:  Diagnosis Date   Aortic atherosclerosis (HCC)    Arthritis    Back pain    Basal cell carcinoma    L forehead, txted in past by another provider   Collagen vascular disease (HCC)    Depression    DOE (dyspnea on exertion)    a. 05/2020 Echo: EF 60-65%, no rwma, nl RV size/fxn.   History of stress test    a. 04/2020 Lexiscan  MV: No ischemia/infarct.   Hyperlipemia    Hypertension    Melanoma (HCC) ~2015   R forearm txted in past by another provider   Recurrent UTI    Squamous cell carcinoma of skin    L med ankle, txted in past by another provider   Past Surgical History:  Procedure Laterality Date   ABDOMINAL HYSTERECTOMY     COLONOSCOPY WITH PROPOFOL  N/A 05/26/2020   Procedure: COLONOSCOPY WITH PROPOFOL ;  Surgeon: Toledo, Ladell POUR, MD;  Location: [facility] ENDOSCOPY;  Service: Gastroenterology;  Laterality: N/A;   INCONTINENCE SURGERY     OOPHORECTOMY     TOTAL HIP ARTHROPLASTY Left 12/11/2023   Procedure: TOTAL HIP ARTHROPLASTY ANTERIOR APPROACH;  Surgeon: Jerri Kay HERO, MD;  Location: [facility] OR;  Service: Orthopedics;  Laterality: Left;   Patient Active Problem List   Diagnosis Date Noted   Age-related osteoporosis without current pathological fracture 01/16/2024   Closed subcapital fracture of neck of left femur, initial encounter (HCC) 12/09/2023   PSVT (paroxysmal supraventricular tachycardia) (HCC) 05/27/2023   Recurrent falls 08/16/2022    Hydrocephalus (HCC) 08/16/2022   Depression 08/16/2022   OSA (obstructive sleep apnea) 04/27/2022   Palpitations 10/03/2021   Dyspnea on exertion 04/19/2020   Aortic atherosclerosis (HCC) 04/19/2020   Essential hypertension 04/19/2020   Hyperlipidemia 04/19/2020   Right carpal tunnel syndrome 11/09/2016    PCP: Valora Agent, MD   REFERRING PROVIDER: Jerri Kay HERO, MD  REFERRING DIAG: [phone] (ICD-10-CM) - S/P total left hip arthroplasty  THERAPY DIAG:  Difficulty in walking, not elsewhere classified - Plan: PT plan of care cert/re-cert  Pain in left hip - Plan: PT plan of care cert/re-cert  Rationale for Evaluation and Treatment: Rehabilitation  ONSET DATE: 12/11/2023  SUBJECTIVE:   SUBJECTIVE STATEMENT: L hip: no pain currently. Just has pain and numbness around her incision.   PERTINENT HISTORY: S/P L THA on 12/11/2023, anterior approach secondary to a fall. [place], pt missed a step on the curb and fell onto her L side resulting in a fracture in L hip. Had home health PT which finished yesterday. Exercises included side stepping, marches, knee flexion, weight shifting, tip toes, steps, SAQ, SLR hip flexion, bridging. Has a hx of back pain  Blood pressure is controlled.  No latex allergies  PAIN:  Are you having pain? Yes: NPRS scale: 0/10 Pain location: L anterior  hip at incision Pain description: pain and numbness Aggravating factors: not provided Relieving factors: not provided  PRECAUTIONS: Anterior hip and Fall  RED FLAGS: Bowel or bladder incontinence: No and Cauda equina syndrome: No   WEIGHT BEARING RESTRICTIONS: WBAT  FALLS:  Has patient fallen in last 6 months? Yes. Number of falls Most recent fall was December 09, 2023 resulting on L hip fracture S/P THA anterior approach.   LIVING ENVIRONMENT: Lives with: lives with their spouse; daughter lives 15 minutes away. Lives in: House/apartment, first floor set up Stairs: Yes:  Internal: 10 steps; can reach both and External: 0 steps; none Has following equipment at home: Single point cane, Walker - 2 wheeled, Tour manager, and Grab bars  OCCUPATION: Retired   PLOF: Independent  PATIENT GOALS: Be able to negotiate steps normally. Improve strength and balance  NEXT MD VISIT: around February 21, 2024  OBJECTIVE:  Note: Objective measures were completed at Evaluation unless otherwise noted.  DIAGNOSTIC FINDINGS:   PATIENT SURVEYS:  LEFS 30/80 (01/25/2024)  COGNITION: Overall cognitive status: Within functional limits for tasks assessed     SENSATION:   EDEMA:      POSTURE: forward flexed, decreased B hip extension  PALPATION:   LOWER EXTREMITY ROM:  Passive ROM Right eval Left eval  Hip flexion    Hip extension    Hip abduction    Hip adduction    Hip internal rotation    Hip external rotation    Knee flexion    Knee extension    Ankle dorsiflexion    Ankle plantarflexion    Ankle inversion    Ankle eversion     (Blank rows = not tested)  LOWER EXTREMITY MMT:  MMT Right eval Left eval  Hip flexion 4 4  Hip extension (seated manually resisted) 4+ 4+  Hip abduction (seated manually resisted) 4- 3+  Hip adduction    Hip internal rotation    Hip external rotation    Knee flexion    Knee extension 5 4+  Ankle dorsiflexion 4+ 4  Ankle plantarflexion    Ankle inversion    Ankle eversion     (Blank rows = not tested)  LOWER EXTREMITY SPECIAL TESTS:    FUNCTIONAL TESTS:  5 times sit to stand: 13.16 seconds without B UE assist Timed up and go (TUG): 11.26 seconds, 9.79 seconds, 9.28 seconds without use of AD Dynamic Gait Index: 16   GAIT: Distance walked: 30 ft Assistive device utilized: Single point cane Level of assistance: Modified independence Comments: Antalgic, decreased stance L LE, SPC on R, decreased B hip extension, forward flexed                                                                                                                                 TREATMENT DATE: 01/25/2024    PATIENT EDUCATION:  Education details: POC Person educated: Patient Education method: Explanation Education comprehension: verbalized understanding  HOME EXERCISE PROGRAM:   ASSESSMENT:  CLINICAL IMPRESSION: Patient is a 67 y.o. female who was seen today for physical therapy evaluation and treatment for S/P L THA anterior approach on 12/11/2023. She currently presents with altered gait pattern and posture, decreased L hip strength, decreased balance, and difficulty performing standing tasks and gait. Pt will benefit from skilled physical therapy services to address the aforementioned deficits.      OBJECTIVE IMPAIRMENTS: Abnormal gait, difficulty walking, decreased ROM, decreased strength, improper body mechanics, postural dysfunction, and pain.   ACTIVITY LIMITATIONS: carrying, lifting, standing, squatting, stairs, transfers, and locomotion level  PARTICIPATION LIMITATIONS:   PERSONAL FACTORS: Age, Fitness, Past/current experiences, and 3+ comorbidities: back pain, Dyspnea on exertion, HTN  are also affecting patient's functional outcome.   REHAB POTENTIAL: Fair    CLINICAL DECISION MAKING: Stable/uncomplicated  EVALUATION COMPLEXITY: Low   GOALS: Goals reviewed with patient? Yes  SHORT TERM GOALS: Target date: 02/10/2024 Pt will be independent with her initial HEP to improve strength, function, and ability to ambulate and perform standing tasks with less difficulty.  Baseline: Pt has not yet started her initial HEP (01/25/2024) Goal status: INITIAL    LONG TERM GOALS: Target date: 03/23/2024  Pt will improve her Dynamic Gait Index (DGI) score to 19 or more as a demonstration of improved balance and decreased fall risk.   Baseline: DGI score 16 (01/25/2024) Goal status: INITIAL  2.  Pt will improve her LEFS score by at least 15 points as a demonstration of improved function.  Baseline: LEFS 30/80  (01/25/2024) Goal status: INITIAL  3.  Pt will improve B glute med and max strength by at least 1/2 MMT grade to promote ability to ambulate and perform standing tasks with less difficulty.  Baseline:  MMT Right eval Left eval  Hip extension (seated manually resisted) 4+ 4+  Hip abduction (seated manually resisted) 4- 3+   Goal status: INITIAL  4.  Pt will improve her 5 times sit <> stand time to 10 seconds or less as a demonstration of improved functional LE strength  Baseline: 5 times sit to stand: 13.16 seconds without B UE assist (01/25/2024) Goal status: INITIAL  5.  Pt will be able to ambulate 500 ft or more without AD and no LOB to promote mobility.  Baseline: Pt currently ambulating with SPC (01/25/2024) Goal status: INITIAL     PLAN:  PT FREQUENCY: 1-2x/week  PT DURATION: 8 weeks  PLANNED INTERVENTIONS: 97110-Therapeutic exercises, 97530- Therapeutic activity, [id number]- Neuromuscular re-education, 97535- Self Care, 02859- Manual therapy, [id number]- Gait training, 97014- Electrical stimulation (unattended), [phone]- Ionotophoresis 4mg /ml Dexamethasone , Patient/Family education, Balance training, and Stair training  PLAN FOR NEXT SESSION: trunk and hip strengthening, gait, balance, manual techniques, modalities PRN   Tawsha Terrero, PT, DPT 01/25/2024, 4:25 PM

## 2024-01-27 ENCOUNTER — Ambulatory Visit: Payer: PPO

## 2024-01-27 DIAGNOSIS — R269 Unspecified abnormalities of gait and mobility: Secondary | ICD-10-CM

## 2024-01-27 DIAGNOSIS — M25552 Pain in left hip: Secondary | ICD-10-CM

## 2024-01-27 DIAGNOSIS — R262 Difficulty in walking, not elsewhere classified: Secondary | ICD-10-CM

## 2024-01-27 NOTE — Therapy (Signed)
 OUTPATIENT PHYSICAL THERAPY TREATMENT   Patient Name: Michelle Cisneros MRN: [id number] DOB:08/08/1956, 68 y.o., female Today's Date: 01/27/2024  END OF SESSION:  PT End of Session - 01/27/24 0951     Visit Number 2    Number of Visits 17    Date for PT Re-Evaluation 03/23/24    PT Start Time 0951    PT Stop Time 1030    PT Time Calculation (min) 39 min    Activity Tolerance Patient tolerated treatment well    Behavior During Therapy [facility] for tasks assessed/performed              Past Medical History:  Diagnosis Date   Aortic atherosclerosis (HCC)    Arthritis    Back pain    Basal cell carcinoma    L forehead, txted in past by another provider   Collagen vascular disease (HCC)    Depression    DOE (dyspnea on exertion)    a. 05/2020 Echo: EF 60-65%, no rwma, nl RV size/fxn.   History of stress test    a. 04/2020 Lexiscan  MV: No ischemia/infarct.   Hyperlipemia    Hypertension    Melanoma (HCC) ~2015   R forearm txted in past by another provider   Recurrent UTI    Squamous cell carcinoma of skin    L med ankle, txted in past by another provider   Past Surgical History:  Procedure Laterality Date   ABDOMINAL HYSTERECTOMY     COLONOSCOPY WITH PROPOFOL  N/A 05/26/2020   Procedure: COLONOSCOPY WITH PROPOFOL ;  Surgeon: Toledo, Ladell POUR, MD;  Location: [facility] ENDOSCOPY;  Service: Gastroenterology;  Laterality: N/A;   INCONTINENCE SURGERY     OOPHORECTOMY     TOTAL HIP ARTHROPLASTY Left 12/11/2023   Procedure: TOTAL HIP ARTHROPLASTY ANTERIOR APPROACH;  Surgeon: Jerri Kay HERO, MD;  Location: [facility] OR;  Service: Orthopedics;  Laterality: Left;   Patient Active Problem List   Diagnosis Date Noted   Age-related osteoporosis without current pathological fracture 01/16/2024   Closed subcapital fracture of neck of left femur, initial encounter (HCC) 12/09/2023   PSVT (paroxysmal supraventricular tachycardia) (HCC) 05/27/2023   Recurrent falls 08/16/2022   Hydrocephalus (HCC)  08/16/2022   Depression 08/16/2022   OSA (obstructive sleep apnea) 04/27/2022   Palpitations 10/03/2021   Dyspnea on exertion 04/19/2020   Aortic atherosclerosis (HCC) 04/19/2020   Essential hypertension 04/19/2020   Hyperlipidemia 04/19/2020   Right carpal tunnel syndrome 11/09/2016    PCP: Valora Agent, MD   REFERRING PROVIDER: Jerri Kay HERO, MD  REFERRING DIAG: [phone] (ICD-10-CM) - S/P total left hip arthroplasty  THERAPY DIAG:  Difficulty in walking, not elsewhere classified  Pain in left hip  Abnormality of gait and mobility  Rationale for Evaluation and Treatment: Rehabilitation  ONSET DATE: 12/11/2023  SUBJECTIVE:   SUBJECTIVE STATEMENT: L hip: no pain currently. Just the normal limitations in the morning. Has not been able to do her HEP since coming back from her daughter's weadding    PERTINENT HISTORY: S/P L THA on 12/11/2023, anterior approach secondary to a fall. [place], pt missed a step on the curb and fell onto her L side resulting in a fracture in L hip. Had home health PT which finished yesterday. Exercises included side stepping, marches, knee flexion, weight shifting, tip toes, steps, SAQ, SLR hip flexion, bridging. Has a hx of back pain  Blood pressure is controlled.  No latex allergies  PAIN:  Are you having pain? Yes: NPRS  scale: 0/10 Pain location: L anterior hip at incision Pain description: pain and numbness Aggravating factors: not provided Relieving factors: not provided  PRECAUTIONS: Anterior hip and Fall  RED FLAGS: Bowel or bladder incontinence: No and Cauda equina syndrome: No   WEIGHT BEARING RESTRICTIONS: WBAT  FALLS:  Has patient fallen in last 6 months? Yes. Number of falls Most recent fall was December 09, 2023 resulting on L hip fracture S/P THA anterior approach.   LIVING ENVIRONMENT: Lives with: lives with their spouse; daughter lives 15 minutes away. Lives in: House/apartment, first floor  set up Stairs: Yes: Internal: 10 steps; can reach both and External: 0 steps; none Has following equipment at home: Single point cane, Walker - 2 wheeled, Tour manager, and Grab bars  OCCUPATION: Retired   PLOF: Independent  PATIENT GOALS: Be able to negotiate steps normally. Improve strength and balance  NEXT MD VISIT: around February 21, 2024  OBJECTIVE:  Note: Objective measures were completed at Evaluation unless otherwise noted.  DIAGNOSTIC FINDINGS:   PATIENT SURVEYS:  LEFS 30/80 (01/25/2024)  COGNITION: Overall cognitive status: Within functional limits for tasks assessed     SENSATION:   EDEMA:      POSTURE: forward flexed, decreased B hip extension  PALPATION:   LOWER EXTREMITY ROM:  Passive ROM Right eval Left eval  Hip flexion    Hip extension    Hip abduction    Hip adduction    Hip internal rotation    Hip external rotation    Knee flexion    Knee extension    Ankle dorsiflexion    Ankle plantarflexion    Ankle inversion    Ankle eversion     (Blank rows = not tested)  LOWER EXTREMITY MMT:  MMT Right eval Left eval  Hip flexion 4 4  Hip extension (seated manually resisted) 4+ 4+  Hip abduction (seated manually resisted) 4- 3+  Hip adduction    Hip internal rotation    Hip external rotation    Knee flexion    Knee extension 5 4+  Ankle dorsiflexion 4+ 4  Ankle plantarflexion    Ankle inversion    Ankle eversion     (Blank rows = not tested)  LOWER EXTREMITY SPECIAL TESTS:    FUNCTIONAL TESTS:  5 times sit to stand: 13.16 seconds without B UE assist Timed up and go (TUG): 11.26 seconds, 9.79 seconds, 9.28 seconds without use of AD Dynamic Gait Index: 16   GAIT: Distance walked: 30 ft Assistive device utilized: Single point cane Level of assistance: Modified independence Comments: Antalgic, decreased stance L LE, SPC on R, decreased B hip extension, forward flexed                                                                                                                                 TREATMENT DATE: 01/27/2024    Therapeutic Activities  Sit <> stand from regular chair no UE  assist 10x2  Forward step up onto Air Ex pad with contralateral UE assist PRN. CGA  L 10x3  Lateral step up onto Air Ex pad with one UE Assist PRN  L 10x3  Forward step up onto and over Air ex pad with contralateral UE assist PRN    L 10x3  Forward step up onto and off first regular step with B UE assist   L 10x2    Improved technique, movement at target joints, use of target muscles after mod verbal, visual, tactile cues.    Gait training SLS with contralateral UE light touch assist to promote L LE strength and balance during stance phase of gait  L 15 seconds x 3. Low back discomfort/fatigue  Gait without use of AD, cues for heel strike 50 ft x 4  Side stepping 20 ft to the R and 20 ft to the L without UE assist  Then with yellow band around knees 20 ft to the R and 20 ft to the L for 2 sets  Stepping over 4 mini hurdles to promote hip flexion and foot clearance during gait and obstacle negotiatoin 10x2 with CGA to min A (secondary to LOB)    Improved technique, movement at target joints, use of target muscles after mod verbal, visual, tactile cues.      PATIENT EDUCATION:  Education details: POC Person educated: Patient Education method: Explanation Education comprehension: verbalized understanding  HOME EXERCISE PROGRAM:   ASSESSMENT:  CLINICAL IMPRESSION: Worked on functional LE strengthening, gait trainding and obstacle negotation to promote ability to move around at home with less difficulty. Pt states feeling more loose after session. Pt will benefit from continued skilled physical therapy services to improve strength, balance, and function.       OBJECTIVE IMPAIRMENTS: Abnormal gait, difficulty walking, decreased ROM, decreased strength, improper body mechanics, postural dysfunction, and pain.    ACTIVITY LIMITATIONS: carrying, lifting, standing, squatting, stairs, transfers, and locomotion level  PARTICIPATION LIMITATIONS:   PERSONAL FACTORS: Age, Fitness, Past/current experiences, and 3+ comorbidities: back pain, Dyspnea on exertion, HTN  are also affecting patient's functional outcome.   REHAB POTENTIAL: Fair    CLINICAL DECISION MAKING: Stable/uncomplicated  EVALUATION COMPLEXITY: Low   GOALS: Goals reviewed with patient? Yes  SHORT TERM GOALS: Target date: 02/10/2024 Pt will be independent with her initial HEP to improve strength, function, and ability to ambulate and perform standing tasks with less difficulty.  Baseline: Pt has not yet started her initial HEP (01/25/2024) Goal status: INITIAL    LONG TERM GOALS: Target date: 03/23/2024  Pt will improve her Dynamic Gait Index (DGI) score to 19 or more as a demonstration of improved balance and decreased fall risk.   Baseline: DGI score 16 (01/25/2024) Goal status: INITIAL  2.  Pt will improve her LEFS score by at least 15 points as a demonstration of improved function.  Baseline: LEFS 30/80 (01/25/2024) Goal status: INITIAL  3.  Pt will improve B glute med and max strength by at least 1/2 MMT grade to promote ability to ambulate and perform standing tasks with less difficulty.  Baseline:  MMT Right eval Left eval  Hip extension (seated manually resisted) 4+ 4+  Hip abduction (seated manually resisted) 4- 3+   Goal status: INITIAL  4.  Pt will improve her 5 times sit <> stand time to 10 seconds or less as a demonstration of improved functional LE strength  Baseline: 5 times sit to stand: 13.16 seconds without B UE assist (01/25/2024) Goal  status: INITIAL  5.  Pt will be able to ambulate 500 ft or more without AD and no LOB to promote mobility.  Baseline: Pt currently ambulating with SPC (01/25/2024) Goal status: INITIAL     PLAN:  PT FREQUENCY: 1-2x/week  PT DURATION: 8 weeks  PLANNED INTERVENTIONS:  97110-Therapeutic exercises, 97530- Therapeutic activity, [id number]- Neuromuscular re-education, 97535- Self Care, 02859- Manual therapy, [id number]- Gait training, 97014- Electrical stimulation (unattended), [phone]- Ionotophoresis 4mg /ml Dexamethasone , Patient/Family education, Balance training, and Stair training  PLAN FOR NEXT SESSION: trunk and hip strengthening, gait, balance, manual techniques, modalities PRN   Floy Riegler, PT, DPT 01/27/2024, 11:59 AM

## 2024-01-28 DIAGNOSIS — G4733 Obstructive sleep apnea (adult) (pediatric): Secondary | ICD-10-CM | POA: Diagnosis not present

## 2024-01-30 ENCOUNTER — Telehealth: Payer: Self-pay | Admitting: Cardiology

## 2024-01-30 NOTE — Telephone Encounter (Signed)
 Patient states she was calling about a call she received about sleep. Please advise

## 2024-01-31 ENCOUNTER — Ambulatory Visit: Payer: PPO | Admitting: Physical Therapy

## 2024-02-01 ENCOUNTER — Ambulatory Visit: Payer: PPO

## 2024-02-01 DIAGNOSIS — R262 Difficulty in walking, not elsewhere classified: Secondary | ICD-10-CM

## 2024-02-01 DIAGNOSIS — M25552 Pain in left hip: Secondary | ICD-10-CM

## 2024-02-01 DIAGNOSIS — R269 Unspecified abnormalities of gait and mobility: Secondary | ICD-10-CM

## 2024-02-01 NOTE — Therapy (Signed)
OUTPATIENT PHYSICAL THERAPY TREATMENT   Patient Name: Michelle Cisneros MRN: [id number] DOB:24-Dec-1955, 68 y.o., female Today's Date: 02/01/2024  END OF SESSION:  PT End of Session - 02/01/24 0951     Visit Number 3    Number of Visits 17    Date for PT Re-Evaluation 03/23/24    PT Start Time 0951    PT Stop Time 1033    PT Time Calculation (min) 42 min    Activity Tolerance Patient tolerated treatment well    Behavior During Therapy [facility] for tasks assessed/performed               Past Medical History:  Diagnosis Date   Aortic atherosclerosis (HCC)    Arthritis    Back pain    Basal cell carcinoma    L forehead, txted in past by another provider   Collagen vascular disease (HCC)    Depression    DOE (dyspnea on exertion)    a. 05/2020 Echo: EF 60-65%, no rwma, nl RV size/fxn.   History of stress test    a. 04/2020 Lexiscan MV: No ischemia/infarct.   Hyperlipemia    Hypertension    Melanoma (HCC) ~2015   R forearm txted in past by another provider   Recurrent UTI    Squamous cell carcinoma of skin    L med ankle, txted in past by another provider   Past Surgical History:  Procedure Laterality Date   ABDOMINAL HYSTERECTOMY     COLONOSCOPY WITH PROPOFOL N/A 05/26/2020   Procedure: COLONOSCOPY WITH PROPOFOL;  Surgeon: Toledo, Boykin Nearing, MD;  Location: [facility] ENDOSCOPY;  Service: Gastroenterology;  Laterality: N/A;   INCONTINENCE SURGERY     OOPHORECTOMY     TOTAL HIP ARTHROPLASTY Left 12/11/2023   Procedure: TOTAL HIP ARTHROPLASTY ANTERIOR APPROACH;  Surgeon: Tarry Kos, MD;  Location: [facility] OR;  Service: Orthopedics;  Laterality: Left;   Patient Active Problem List   Diagnosis Date Noted   Age-related osteoporosis without current pathological fracture 01/16/2024   Closed subcapital fracture of neck of left femur, initial encounter (HCC) 12/09/2023   PSVT (paroxysmal supraventricular tachycardia) (HCC) 05/27/2023   Recurrent falls 08/16/2022   Hydrocephalus (HCC)  08/16/2022   Depression 08/16/2022   OSA (obstructive sleep apnea) 04/27/2022   Palpitations 10/03/2021   Dyspnea on exertion 04/19/2020   Aortic atherosclerosis (HCC) 04/19/2020   Essential hypertension 04/19/2020   Hyperlipidemia 04/19/2020   Right carpal tunnel syndrome 11/09/2016    PCP: Jerl Mina, MD   REFERRING PROVIDER: Tarry Kos, MD  REFERRING DIAG: [phone] (ICD-10-CM) - S/P total left hip arthroplasty  THERAPY DIAG:  Difficulty in walking, not elsewhere classified  Pain in left hip  Abnormality of gait and mobility  Rationale for Evaluation and Treatment: Rehabilitation  ONSET DATE: 12/11/2023  SUBJECTIVE:   SUBJECTIVE STATEMENT: L hip is not bothering her.    PERTINENT HISTORY: S/P L THA on 12/11/2023, anterior approach secondary to a fall. [place], pt missed a step on the curb and fell onto her L side resulting in a fracture in L hip. Had home health PT which finished yesterday. Exercises included side stepping, marches, knee flexion, weight shifting, tip toes, steps, SAQ, SLR hip flexion, bridging. Has a hx of back pain  Blood pressure is controlled.  No latex allergies  PAIN:  Are you having pain? Yes: NPRS scale: 0/10 Pain location: L anterior hip at incision Pain description: "pain and numbness" Aggravating factors: not provided Relieving factors:  not provided  PRECAUTIONS: Anterior hip and Fall  RED FLAGS: Bowel or bladder incontinence: No and Cauda equina syndrome: No   WEIGHT BEARING RESTRICTIONS: WBAT  FALLS:  Has patient fallen in last 6 months? Yes. Number of falls Most recent fall was December 09, 2023 resulting on L hip fracture S/P THA anterior approach.   LIVING ENVIRONMENT: Lives with: lives with their spouse; daughter lives 15 minutes away. Lives in: House/apartment, first floor set up Stairs: Yes: Internal: 10 steps; can reach both and External: 0 steps; none Has following equipment at home:  Single point cane, Walker - 2 wheeled, Tour manager, and Grab bars  OCCUPATION: Retired   PLOF: Independent  PATIENT GOALS: Be able to negotiate steps normally. Improve strength and balance  NEXT MD VISIT: around February 21, 2024  OBJECTIVE:  Note: Objective measures were completed at Evaluation unless otherwise noted.  DIAGNOSTIC FINDINGS:   PATIENT SURVEYS:  LEFS 30/80 (01/25/2024)  COGNITION: Overall cognitive status: Within functional limits for tasks assessed     SENSATION:   EDEMA:      POSTURE: forward flexed, decreased B hip extension  PALPATION:   LOWER EXTREMITY ROM:  Passive ROM Right eval Left eval  Hip flexion    Hip extension    Hip abduction    Hip adduction    Hip internal rotation    Hip external rotation    Knee flexion    Knee extension    Ankle dorsiflexion    Ankle plantarflexion    Ankle inversion    Ankle eversion     (Blank rows = not tested)  LOWER EXTREMITY MMT:  MMT Right eval Left eval  Hip flexion 4 4  Hip extension (seated manually resisted) 4+ 4+  Hip abduction (seated manually resisted) 4- 3+  Hip adduction    Hip internal rotation    Hip external rotation    Knee flexion    Knee extension 5 4+  Ankle dorsiflexion 4+ 4  Ankle plantarflexion    Ankle inversion    Ankle eversion     (Blank rows = not tested)  LOWER EXTREMITY SPECIAL TESTS:    FUNCTIONAL TESTS:  5 times sit to stand: 13.16 seconds without B UE assist Timed up and go (TUG): 11.26 seconds, 9.79 seconds, 9.28 seconds without use of AD Dynamic Gait Index: 16   GAIT: Distance walked: 30 ft Assistive device utilized: Single point cane Level of assistance: Modified independence Comments: Antalgic, decreased stance L LE, SPC on R, decreased B hip extension, forward flexed                                                                                                                                TREATMENT DATE: 02/01/2024    Therapeutic  Activities  Sit <> stand from regular chair no UE assist 10x2  Forward sled push no weight 30 ft x 2   Forward step up onto Air Ex  pad with contralateral UE assist PRN. CGA  L 10x3  Lateral step up onto Air Ex pad with one UE Assist PRN CGA to SBA  L 10x3  Forward step up onto and over Air ex pad with contralateral UE assist PRN  L 10x3  Forward step up onto and off first regular step with B UE assist   L 10x2  Standing with B UE assist  L hip abduction with yellow band 10x2   Then 8x3 second holds   Standing static mini lunge with contralateral UE assist   L 10x3    Improved technique, movement at target joints, use of target muscles after mod verbal, visual, tactile cues.    Gait training SLS with contralateral UE light touch assist to promote L LE strength and balance during stance phase of gait  L 10x3 with 5 second holds  Gait without use of AD, cues for heel strike 100 ft  Stepping over 4 mini hurdles to promote hip flexion and foot clearance during gait and obstacle negotiatoin 10x2 with CGA   LOB x 1 secondary to decrease foot clearance    Improved technique, movement at target joints, use of target muscles after mod verbal, visual, tactile cues.      PATIENT EDUCATION:  Education details: POC Person educated: Patient Education method: Explanation Education comprehension: verbalized understanding  HOME EXERCISE PROGRAM: Access Code: [email or web address] URL: [email or web address] Date: 02/01/2024 Prepared by: Loralyn Freshwater  Exercises - Standing Partial Lunge  - 1 x daily - 7 x weekly - 3 sets - 10 reps  ASSESSMENT:  CLINICAL IMPRESSION: Continued working on functional LE strengthening, gait training and obstacle negotation to promote ability to move around at home with less difficulty. Pt tolerated session well without aggravation of symptoms. Pt will benefit from continued skilled physical therapy services to improve strength, balance, and  function.       OBJECTIVE IMPAIRMENTS: Abnormal gait, difficulty walking, decreased ROM, decreased strength, improper body mechanics, postural dysfunction, and pain.   ACTIVITY LIMITATIONS: carrying, lifting, standing, squatting, stairs, transfers, and locomotion level  PARTICIPATION LIMITATIONS:   PERSONAL FACTORS: Age, Fitness, Past/current experiences, and 3+ comorbidities: back pain, Dyspnea on exertion, HTN  are also affecting patient's functional outcome.   REHAB POTENTIAL: Fair    CLINICAL DECISION MAKING: Stable/uncomplicated  EVALUATION COMPLEXITY: Low   GOALS: Goals reviewed with patient? Yes  SHORT TERM GOALS: Target date: 02/10/2024 Pt will be independent with her initial HEP to improve strength, function, and ability to ambulate and perform standing tasks with less difficulty.  Baseline: Pt has not yet started her initial HEP (01/25/2024) Goal status: INITIAL    LONG TERM GOALS: Target date: 03/23/2024  Pt will improve her Dynamic Gait Index (DGI) score to 19 or more as a demonstration of improved balance and decreased fall risk.   Baseline: DGI score 16 (01/25/2024) Goal status: INITIAL  2.  Pt will improve her LEFS score by at least 15 points as a demonstration of improved function.  Baseline: LEFS 30/80 (01/25/2024) Goal status: INITIAL  3.  Pt will improve B glute med and max strength by at least 1/2 MMT grade to promote ability to ambulate and perform standing tasks with less difficulty.  Baseline:  MMT Right eval Left eval  Hip extension (seated manually resisted) 4+ 4+  Hip abduction (seated manually resisted) 4- 3+   Goal status: INITIAL  4.  Pt will improve her 5 times sit <> stand time to 10 seconds or less as a  demonstration of improved functional LE strength  Baseline: 5 times sit to stand: 13.16 seconds without B UE assist (01/25/2024) Goal status: INITIAL  5.  Pt will be able to ambulate 500 ft or more without AD and no LOB to promote mobility.   Baseline: Pt currently ambulating with SPC (01/25/2024) Goal status: INITIAL     PLAN:  PT FREQUENCY: 1-2x/week  PT DURATION: 8 weeks  PLANNED INTERVENTIONS: 97110-Therapeutic exercises, 97530- Therapeutic activity, [id number]- Neuromuscular re-education, 97535- Self Care, 40981- Manual therapy, [id number]- Gait training, 97014- Electrical stimulation (unattended), [phone]- Ionotophoresis 4mg /ml Dexamethasone, Patient/Family education, Balance training, and Stair training  PLAN FOR NEXT SESSION: trunk and hip strengthening, gait, balance, manual techniques, modalities PRN   Elenie Coven, PT, DPT 02/01/2024, 4:16 PM

## 2024-02-03 ENCOUNTER — Ambulatory Visit: Payer: PPO

## 2024-02-03 DIAGNOSIS — R269 Unspecified abnormalities of gait and mobility: Secondary | ICD-10-CM

## 2024-02-03 DIAGNOSIS — M25552 Pain in left hip: Secondary | ICD-10-CM

## 2024-02-03 DIAGNOSIS — R262 Difficulty in walking, not elsewhere classified: Secondary | ICD-10-CM

## 2024-02-03 NOTE — Therapy (Signed)
OUTPATIENT PHYSICAL THERAPY TREATMENT   Patient Name: Michelle Cisneros MRN: [id number] DOB:1956/09/20, 68 y.o., female Today's Date: 02/03/2024  END OF SESSION:  PT End of Session - 02/03/24 0947     Visit Number 4    Number of Visits 17    Date for PT Re-Evaluation 03/23/24    PT Start Time 0947    PT Stop Time 1029    PT Time Calculation (min) 42 min    Activity Tolerance Patient tolerated treatment well    Behavior During Therapy [facility] for tasks assessed/performed                Past Medical History:  Diagnosis Date   Aortic atherosclerosis (HCC)    Arthritis    Back pain    Basal cell carcinoma    L forehead, txted in past by another provider   Collagen vascular disease (HCC)    Depression    DOE (dyspnea on exertion)    a. 05/2020 Echo: EF 60-65%, no rwma, nl RV size/fxn.   History of stress test    a. 04/2020 Lexiscan MV: No ischemia/infarct.   Hyperlipemia    Hypertension    Melanoma (HCC) ~2015   R forearm txted in past by another provider   Recurrent UTI    Squamous cell carcinoma of skin    L med ankle, txted in past by another provider   Past Surgical History:  Procedure Laterality Date   ABDOMINAL HYSTERECTOMY     COLONOSCOPY WITH PROPOFOL N/A 05/26/2020   Procedure: COLONOSCOPY WITH PROPOFOL;  Surgeon: Toledo, Boykin Nearing, MD;  Location: [facility] ENDOSCOPY;  Service: Gastroenterology;  Laterality: N/A;   INCONTINENCE SURGERY     OOPHORECTOMY     TOTAL HIP ARTHROPLASTY Left 12/11/2023   Procedure: TOTAL HIP ARTHROPLASTY ANTERIOR APPROACH;  Surgeon: Tarry Kos, MD;  Location: [facility] OR;  Service: Orthopedics;  Laterality: Left;   Patient Active Problem List   Diagnosis Date Noted   Age-related osteoporosis without current pathological fracture 01/16/2024   Closed subcapital fracture of neck of left femur, initial encounter (HCC) 12/09/2023   PSVT (paroxysmal supraventricular tachycardia) (HCC) 05/27/2023   Recurrent falls 08/16/2022   Hydrocephalus  (HCC) 08/16/2022   Depression 08/16/2022   OSA (obstructive sleep apnea) 04/27/2022   Palpitations 10/03/2021   Dyspnea on exertion 04/19/2020   Aortic atherosclerosis (HCC) 04/19/2020   Essential hypertension 04/19/2020   Hyperlipidemia 04/19/2020   Right carpal tunnel syndrome 11/09/2016    PCP: Jerl Mina, MD   REFERRING PROVIDER: Tarry Kos, MD  REFERRING DIAG: [phone] (ICD-10-CM) - S/P total left hip arthroplasty  THERAPY DIAG:  Difficulty in walking, not elsewhere classified  Pain in left hip  Abnormality of gait and mobility  Rationale for Evaluation and Treatment: Rehabilitation  ONSET DATE: 12/11/2023  SUBJECTIVE:   SUBJECTIVE STATEMENT: No pain or discomfort. Was fine after last session.    PERTINENT HISTORY: S/P L THA on 12/11/2023, anterior approach secondary to a fall. [place], pt missed a step on the curb and fell onto her L side resulting in a fracture in L hip. Had home health PT which finished yesterday. Exercises included side stepping, marches, knee flexion, weight shifting, tip toes, steps, SAQ, SLR hip flexion, bridging. Has a hx of back pain  Blood pressure is controlled.  No latex allergies  PAIN:  Are you having pain? Yes: NPRS scale: 0/10 Pain location: L anterior hip at incision Pain description: "pain and numbness" Aggravating factors:  not provided Relieving factors: not provided  PRECAUTIONS: Anterior hip and Fall  RED FLAGS: Bowel or bladder incontinence: No and Cauda equina syndrome: No   WEIGHT BEARING RESTRICTIONS: WBAT  FALLS:  Has patient fallen in last 6 months? Yes. Number of falls Most recent fall was December 09, 2023 resulting on L hip fracture S/P THA anterior approach.   LIVING ENVIRONMENT: Lives with: lives with their spouse; daughter lives 15 minutes away. Lives in: House/apartment, first floor set up Stairs: Yes: Internal: 10 steps; can reach both and External: 0 steps; none Has  following equipment at home: Single point cane, Walker - 2 wheeled, Tour manager, and Grab bars  OCCUPATION: Retired   PLOF: Independent  PATIENT GOALS: Be able to negotiate steps normally. Improve strength and balance  NEXT MD VISIT: around February 21, 2024  OBJECTIVE:  Note: Objective measures were completed at Evaluation unless otherwise noted.  DIAGNOSTIC FINDINGS:   PATIENT SURVEYS:  LEFS 30/80 (01/25/2024)  COGNITION: Overall cognitive status: Within functional limits for tasks assessed     SENSATION:   EDEMA:      POSTURE: forward flexed, decreased B hip extension  PALPATION:   LOWER EXTREMITY ROM:  Passive ROM Right eval Left eval  Hip flexion    Hip extension    Hip abduction    Hip adduction    Hip internal rotation    Hip external rotation    Knee flexion    Knee extension    Ankle dorsiflexion    Ankle plantarflexion    Ankle inversion    Ankle eversion     (Blank rows = not tested)  LOWER EXTREMITY MMT:  MMT Right eval Left eval  Hip flexion 4 4  Hip extension (seated manually resisted) 4+ 4+  Hip abduction (seated manually resisted) 4- 3+  Hip adduction    Hip internal rotation    Hip external rotation    Knee flexion    Knee extension 5 4+  Ankle dorsiflexion 4+ 4  Ankle plantarflexion    Ankle inversion    Ankle eversion     (Blank rows = not tested)  LOWER EXTREMITY SPECIAL TESTS:    FUNCTIONAL TESTS:  5 times sit to stand: 13.16 seconds without B UE assist Timed up and go (TUG): 11.26 seconds, 9.79 seconds, 9.28 seconds without use of AD Dynamic Gait Index: 16   GAIT: Distance walked: 30 ft Assistive device utilized: Single point cane Level of assistance: Modified independence Comments: Antalgic, decreased stance L LE, SPC on R, decreased B hip extension, forward flexed                                                                                                                                TREATMENT DATE:  02/03/2024    Neuromuscular re-education  NuStep seat 7, arms 7, level 3 for 5 minutes   SLS with contralateral UE light touch assist to promote L LE strength  and balance during stance phase of gait and fore obstacle negotiation  L 10x3 with 5 second holds  SLS with contralateral toe taps onto forward cone with contralateral UE light touch assist PRN, CGA from PT  Standing on L with R LE toe taps 10x  SLS with contralateral foot in pillow case on hard floor with contralateral UE light touch assist PRN, CGA from PT    Standing on L with R hip circles clockwise and counterclockwise 10x3 each direction   Standing on L with R hip abduction 10x3   Stepping over 4 mini hurdles to promote hip flexion and foot clearance during gait and obstacle negotiatoin 10x2 with CGA   LOB x 3 secondary to decrease foot clearance and distraction  Pt demonstrates tendency to trip with R foot  Forward step up onto and over Air ex pad with contralateral UE assist PRN  L 10x3 CGA to SBA  Lateral step up onto and over Air Ex pad without UE assist, CGA  L 10x3  R 10x3  On Air ex beam with B UE assist PRN  Siede stepping 10x each diretion     Improved technique, movement at target joints, use of target muscles after mod verbal, visual, tactile cues.      PATIENT EDUCATION:  Education details: POC Person educated: Patient Education method: Explanation Education comprehension: verbalized understanding  HOME EXERCISE PROGRAM: Access Code: [email or web address] URL: [email or web address] Date: 02/01/2024 Prepared by: Loralyn Freshwater  Exercises - Standing Partial Lunge  - 1 x daily - 7 x weekly - 3 sets - 10 reps - Standing Single Leg Stance with Unilateral Counter Support  - 1 x daily - 7 x weekly - 3 sets - 10 reps - 5 seconds hold  ASSESSMENT:  CLINICAL IMPRESSION:  Worked on L LE neuromuscular control and strengthening to promote ability to ambulate and negotiate obstacles with less  difficulty. Pt demonstrates tendency to trip with her R foot. Cues needed to promote hip and knee flexion for foot clearance.  Pt tolerated session well without aggravation of symptoms. Pt will benefit from continued skilled physical therapy services to improve strength, balance, and function.       OBJECTIVE IMPAIRMENTS: Abnormal gait, difficulty walking, decreased ROM, decreased strength, improper body mechanics, postural dysfunction, and pain.   ACTIVITY LIMITATIONS: carrying, lifting, standing, squatting, stairs, transfers, and locomotion level  PARTICIPATION LIMITATIONS:   PERSONAL FACTORS: Age, Fitness, Past/current experiences, and 3+ comorbidities: back pain, Dyspnea on exertion, HTN  are also affecting patient's functional outcome.   REHAB POTENTIAL: Fair    CLINICAL DECISION MAKING: Stable/uncomplicated  EVALUATION COMPLEXITY: Low   GOALS: Goals reviewed with patient? Yes  SHORT TERM GOALS: Target date: 02/10/2024 Pt will be independent with her initial HEP to improve strength, function, and ability to ambulate and perform standing tasks with less difficulty.  Baseline: Pt has not yet started her initial HEP (01/25/2024) Goal status: INITIAL    LONG TERM GOALS: Target date: 03/23/2024  Pt will improve her Dynamic Gait Index (DGI) score to 19 or more as a demonstration of improved balance and decreased fall risk.   Baseline: DGI score 16 (01/25/2024) Goal status: INITIAL  2.  Pt will improve her LEFS score by at least 15 points as a demonstration of improved function.  Baseline: LEFS 30/80 (01/25/2024) Goal status: INITIAL  3.  Pt will improve B glute med and max strength by at least 1/2 MMT grade to promote ability to ambulate and perform standing tasks with less  difficulty.  Baseline:  MMT Right eval Left eval  Hip extension (seated manually resisted) 4+ 4+  Hip abduction (seated manually resisted) 4- 3+   Goal status: INITIAL  4.  Pt will improve her 5 times sit  <> stand time to 10 seconds or less as a demonstration of improved functional LE strength  Baseline: 5 times sit to stand: 13.16 seconds without B UE assist (01/25/2024) Goal status: INITIAL  5.  Pt will be able to ambulate 500 ft or more without AD and no LOB to promote mobility.  Baseline: Pt currently ambulating with SPC (01/25/2024) Goal status: INITIAL     PLAN:  PT FREQUENCY: 1-2x/week  PT DURATION: 8 weeks  PLANNED INTERVENTIONS: 97110-Therapeutic exercises, 97530- Therapeutic activity, [id number]- Neuromuscular re-education, 97535- Self Care, 40981- Manual therapy, [id number]- Gait training, 97014- Electrical stimulation (unattended), [phone]- Ionotophoresis 4mg /ml Dexamethasone, Patient/Family education, Balance training, and Stair training  PLAN FOR NEXT SESSION: trunk and hip strengthening, gait, balance, manual techniques, modalities PRN   Gerline Ratto, PT, DPT 02/03/2024, 11:36 AM

## 2024-02-06 ENCOUNTER — Ambulatory Visit: Payer: PPO

## 2024-02-06 ENCOUNTER — Telehealth: Payer: Self-pay

## 2024-02-06 DIAGNOSIS — M25552 Pain in left hip: Secondary | ICD-10-CM

## 2024-02-06 DIAGNOSIS — R262 Difficulty in walking, not elsewhere classified: Secondary | ICD-10-CM

## 2024-02-06 NOTE — Telephone Encounter (Signed)
Call to patient to find out if she is willing to complete cpap titration so she can be seen for follow up by Dr. Mayford Knife. No answer, LMTCB per DPR.  Patient was previously contacted on MyChart on 01/30/24. She was also called on 12/27/23 to see if we could assist with setting up Cpap titration so she can continue  cpap treatment and have compliance and follow up appointments with Dr. Mayford Knife. Patient will not be seen until she completes cpap titration per Dr. Mayford Knife.   Letter Sent.

## 2024-02-06 NOTE — Telephone Encounter (Signed)
**Note De-Identified Michelle Cisneros Obfuscation** Per the [facility] message sent to the pt from Dr Norris Cross nurse on 01/30/24, I did a CPAP Titration PA through the [facility] portal.  Per the [facility] provider portal this PA has been approved. Service Dates: 02/06/2024 - 05/06/2024 Outpatient Authorization [id number]  I have forwarded the CPAP Titration order to the sleep lab so they can contact the pt to schedule the test.

## 2024-02-06 NOTE — Therapy (Signed)
OUTPATIENT PHYSICAL THERAPY TREATMENT   Patient Name: Michelle Cisneros MRN: [id number] DOB:13-Jul-1956, 68 y.o., female Today's Date: 02/06/2024  END OF SESSION:  PT End of Session - 02/06/24 0901     Visit Number 5    Number of Visits 17    Date for PT Re-Evaluation 03/23/24    PT Start Time 0902    PT Stop Time 0942    PT Time Calculation (min) 40 min    Activity Tolerance Patient tolerated treatment well    Behavior During Therapy [facility] for tasks assessed/performed                 Past Medical History:  Diagnosis Date   Aortic atherosclerosis (HCC)    Arthritis    Back pain    Basal cell carcinoma    L forehead, txted in past by another provider   Collagen vascular disease (HCC)    Depression    DOE (dyspnea on exertion)    a. 05/2020 Echo: EF 60-65%, no rwma, nl RV size/fxn.   History of stress test    a. 04/2020 Lexiscan MV: No ischemia/infarct.   Hyperlipemia    Hypertension    Melanoma (HCC) ~2015   R forearm txted in past by another provider   Recurrent UTI    Squamous cell carcinoma of skin    L med ankle, txted in past by another provider   Past Surgical History:  Procedure Laterality Date   ABDOMINAL HYSTERECTOMY     COLONOSCOPY WITH PROPOFOL N/A 05/26/2020   Procedure: COLONOSCOPY WITH PROPOFOL;  Surgeon: Toledo, Boykin Nearing, MD;  Location: [facility] ENDOSCOPY;  Service: Gastroenterology;  Laterality: N/A;   INCONTINENCE SURGERY     OOPHORECTOMY     TOTAL HIP ARTHROPLASTY Left 12/11/2023   Procedure: TOTAL HIP ARTHROPLASTY ANTERIOR APPROACH;  Surgeon: Tarry Kos, MD;  Location: [facility] OR;  Service: Orthopedics;  Laterality: Left;   Patient Active Problem List   Diagnosis Date Noted   Age-related osteoporosis without current pathological fracture 01/16/2024   Closed subcapital fracture of neck of left femur, initial encounter (HCC) 12/09/2023   PSVT (paroxysmal supraventricular tachycardia) (HCC) 05/27/2023   Recurrent falls 08/16/2022   Hydrocephalus  (HCC) 08/16/2022   Depression 08/16/2022   OSA (obstructive sleep apnea) 04/27/2022   Palpitations 10/03/2021   Dyspnea on exertion 04/19/2020   Aortic atherosclerosis (HCC) 04/19/2020   Essential hypertension 04/19/2020   Hyperlipidemia 04/19/2020   Right carpal tunnel syndrome 11/09/2016    PCP: Jerl Mina, MD   REFERRING PROVIDER: Tarry Kos, MD  REFERRING DIAG: [phone] (ICD-10-CM) - S/P total left hip arthroplasty  THERAPY DIAG:  Difficulty in walking, not elsewhere classified  Pain in left hip  Rationale for Evaluation and Treatment: Rehabilitation  ONSET DATE: 12/11/2023  SUBJECTIVE:   SUBJECTIVE STATEMENT: No pain or discomfort. Was fine after last session.    PERTINENT HISTORY: S/P L THA on 12/11/2023, anterior approach secondary to a fall. [place], pt missed a step on the curb and fell onto her L side resulting in a fracture in L hip. Had home health PT which finished yesterday. Exercises included side stepping, marches, knee flexion, weight shifting, tip toes, steps, SAQ, SLR hip flexion, bridging. Has a hx of back pain  Blood pressure is controlled.  No latex allergies  PAIN:  Are you having pain? Yes: NPRS scale: 0/10 Pain location: L anterior hip at incision Pain description: "pain and numbness" Aggravating factors: not provided Relieving factors: not  provided  PRECAUTIONS: Anterior hip and Fall  RED FLAGS: Bowel or bladder incontinence: No and Cauda equina syndrome: No   WEIGHT BEARING RESTRICTIONS: WBAT  FALLS:  Has patient fallen in last 6 months? Yes. Number of falls Most recent fall was December 09, 2023 resulting on L hip fracture S/P THA anterior approach.   LIVING ENVIRONMENT: Lives with: lives with their spouse; daughter lives 15 minutes away. Lives in: House/apartment, first floor set up Stairs: Yes: Internal: 10 steps; can reach both and External: 0 steps; none Has following equipment at home: Single  point cane, Walker - 2 wheeled, Tour manager, and Grab bars  OCCUPATION: Retired   PLOF: Independent  PATIENT GOALS: Be able to negotiate steps normally. Improve strength and balance  NEXT MD VISIT: around February 21, 2024  OBJECTIVE:  Note: Objective measures were completed at Evaluation unless otherwise noted.  DIAGNOSTIC FINDINGS:   PATIENT SURVEYS:  LEFS 30/80 (01/25/2024)  COGNITION: Overall cognitive status: Within functional limits for tasks assessed     SENSATION:   EDEMA:      POSTURE: forward flexed, decreased B hip extension  PALPATION:   LOWER EXTREMITY ROM:  Passive ROM Right eval Left eval  Hip flexion    Hip extension    Hip abduction    Hip adduction    Hip internal rotation    Hip external rotation    Knee flexion    Knee extension    Ankle dorsiflexion    Ankle plantarflexion    Ankle inversion    Ankle eversion     (Blank rows = not tested)  LOWER EXTREMITY MMT:  MMT Right eval Left eval  Hip flexion 4 4  Hip extension (seated manually resisted) 4+ 4+  Hip abduction (seated manually resisted) 4- 3+  Hip adduction    Hip internal rotation    Hip external rotation    Knee flexion    Knee extension 5 4+  Ankle dorsiflexion 4+ 4  Ankle plantarflexion    Ankle inversion    Ankle eversion     (Blank rows = not tested)  LOWER EXTREMITY SPECIAL TESTS:    FUNCTIONAL TESTS:  5 times sit to stand: 13.16 seconds without B UE assist Timed up and go (TUG): 11.26 seconds, 9.79 seconds, 9.28 seconds without use of AD Dynamic Gait Index: 16   GAIT: Distance walked: 30 ft Assistive device utilized: Single point cane Level of assistance: Modified independence Comments: Antalgic, decreased stance L LE, SPC on R, decreased B hip extension, forward flexed                                                                                                                                TREATMENT DATE: 02/06/2024   Therapeutic  exercise  NuStep seat 8, arms 8, level 3 for 5 minutes  Standing with B UE assist   Hip abduction with yellow band around knees   R 10x3  L 10x3  Side stepping with yellow band around knees  30 ft to the L and 30 ft to the R  Sit <> stand with yellow band around knees, emphasis on femoral control 10x, no UE assist    Standing with B UE assist  B gastroc stretch at first stair step 30 seconds x 3    Improved exercise technique, movement at target joints, use of target muscles after mod verbal, visual, tactile cues.     Neuromuscular re-education Standing alternating toe taps onto cone with one UE assist PRN  R 10x3  L 10x3   Unable to clear R and L during 9th repetition of 3rd set, Min A to recover   SLS with contralateral UE light touch assist to promote L LE strength and balance during stance phase of gait and fore obstacle negotiation  L 10x3 with 5 second holds   Stepping over 4 mini hurdles to promote hip flexion and foot clearance during gait and obstacle negotiatoin 10x2 with CGA   Did not clear x 3 secondary to decrease foot clearance   Pt demonstrates tendency to trip with R foot. Cues for ankle DF needed to promote foot clearance.    SLS with contralateral foot in pillow case on hard floor with contralateral UE light touch assist PRN, CGA from PT    Standing on L with R hip circles clockwise and counterclockwise 10x3 each direction   Standing on L with R hip abduction 10x3    Forward step up onto and over Air ex pad with contralateral UE assist PRN  L 10x3 CGA to SBA      Improved technique, movement at target joints, use of target muscles after mod verbal, visual, tactile cues.      PATIENT EDUCATION:  Education details: POC Person educated: Patient Education method: Explanation Education comprehension: verbalized understanding  HOME EXERCISE PROGRAM: Access Code: [email or web address] URL: [email or web address] Date:  02/01/2024 Prepared by: Loralyn Freshwater  Exercises - Standing Partial Lunge  - 1 x daily - 7 x weekly - 3 sets - 10 reps - Standing Single Leg Stance with Unilateral Counter Support  - 1 x daily - 7 x weekly - 3 sets - 10 reps - 5 seconds hold - Standing Gastroc Stretch on Step with Counter Support  - 3 x daily - 7 x weekly - 1 sets - 10 reps - 30 seconds hold    ASSESSMENT:  CLINICAL IMPRESSION:  Continued working on L LE strengthening and neuromuscular control to promote ability to ambulate and negotiate obstacles with less difficulty. Pt demonstrates tendency to trip with her R foot. Cues needed to promote ankle DF for foot clearance.  Pt tolerated session well without aggravation of symptoms. Pt will benefit from continued skilled physical therapy services to improve strength, balance, and function.       OBJECTIVE IMPAIRMENTS: Abnormal gait, difficulty walking, decreased ROM, decreased strength, improper body mechanics, postural dysfunction, and pain.   ACTIVITY LIMITATIONS: carrying, lifting, standing, squatting, stairs, transfers, and locomotion level  PARTICIPATION LIMITATIONS:   PERSONAL FACTORS: Age, Fitness, Past/current experiences, and 3+ comorbidities: back pain, Dyspnea on exertion, HTN  are also affecting patient's functional outcome.   REHAB POTENTIAL: Fair    CLINICAL DECISION MAKING: Stable/uncomplicated  EVALUATION COMPLEXITY: Low   GOALS: Goals reviewed with patient? Yes  SHORT TERM GOALS: Target date: 02/10/2024 Pt will be independent with her initial HEP to improve strength, function, and ability to ambulate and perform standing tasks with less difficulty.  Baseline: Pt has not yet started her initial HEP (01/25/2024) Goal status: INITIAL    LONG TERM GOALS: Target date: 03/23/2024  Pt will improve her Dynamic Gait Index (DGI) score to 19 or more as a demonstration of improved balance and decreased fall risk.   Baseline: DGI score 16 (01/25/2024) Goal  status: INITIAL  2.  Pt will improve her LEFS score by at least 15 points as a demonstration of improved function.  Baseline: LEFS 30/80 (01/25/2024) Goal status: INITIAL  3.  Pt will improve B glute med and max strength by at least 1/2 MMT grade to promote ability to ambulate and perform standing tasks with less difficulty.  Baseline:  MMT Right eval Left eval  Hip extension (seated manually resisted) 4+ 4+  Hip abduction (seated manually resisted) 4- 3+   Goal status: INITIAL  4.  Pt will improve her 5 times sit <> stand time to 10 seconds or less as a demonstration of improved functional LE strength  Baseline: 5 times sit to stand: 13.16 seconds without B UE assist (01/25/2024) Goal status: INITIAL  5.  Pt will be able to ambulate 500 ft or more without AD and no LOB to promote mobility.  Baseline: Pt currently ambulating with SPC (01/25/2024) Goal status: INITIAL     PLAN:  PT FREQUENCY: 1-2x/week  PT DURATION: 8 weeks  PLANNED INTERVENTIONS: 97110-Therapeutic exercises, 97530- Therapeutic activity, [id number]- Neuromuscular re-education, 97535- Self Care, [id number]- Manual therapy, [id number]- Gait training, 97014- Electrical stimulation (unattended), [phone]- Ionotophoresis 4mg /ml Dexamethasone, Patient/Family education, Balance training, and Stair training  PLAN FOR NEXT SESSION: trunk and hip strengthening, gait, balance, manual techniques, modalities PRN   Xitlaly Ault, PT, DPT 02/06/2024, 9:49 AM

## 2024-02-07 ENCOUNTER — Ambulatory Visit: Payer: PPO | Admitting: Cardiology

## 2024-02-08 ENCOUNTER — Ambulatory Visit: Payer: PPO

## 2024-02-13 ENCOUNTER — Ambulatory Visit: Payer: PPO

## 2024-02-13 ENCOUNTER — Telehealth: Payer: Self-pay | Admitting: Cardiology

## 2024-02-13 DIAGNOSIS — R2689 Other abnormalities of gait and mobility: Secondary | ICD-10-CM

## 2024-02-13 DIAGNOSIS — M545 Low back pain, unspecified: Secondary | ICD-10-CM

## 2024-02-13 DIAGNOSIS — M25552 Pain in left hip: Secondary | ICD-10-CM

## 2024-02-13 DIAGNOSIS — R262 Difficulty in walking, not elsewhere classified: Secondary | ICD-10-CM | POA: Diagnosis not present

## 2024-02-13 DIAGNOSIS — R269 Unspecified abnormalities of gait and mobility: Secondary | ICD-10-CM

## 2024-02-13 NOTE — Therapy (Signed)
 OUTPATIENT PHYSICAL THERAPY TREATMENT   Patient Name: Michelle Cisneros MRN: [id number] DOB:[birth year], 68 y.o., female Today's Date: 02/13/2024  END OF SESSION:  PT End of Session - 02/13/24 0857     Visit Number 6    Number of Visits 17    Date for PT Re-Evaluation 03/23/24    PT Start Time 0900    PT Stop Time 0945    PT Time Calculation (min) 45 min    Activity Tolerance Patient tolerated treatment well    Behavior During Therapy [facility] for tasks assessed/performed              Past Medical History:  Diagnosis Date   Aortic atherosclerosis (HCC)    Arthritis    Back pain    Basal cell carcinoma    L forehead, txted in past by another provider   Collagen vascular disease (HCC)    Depression    DOE (dyspnea on exertion)    a. 05/2020 Echo: EF 60-65%, no rwma, nl RV size/fxn.   History of stress test    a. 04/2020 Lexiscan MV: No ischemia/infarct.   Hyperlipemia    Hypertension    Melanoma (HCC) ~2015   R forearm txted in past by another provider   Recurrent UTI    Squamous cell carcinoma of skin    L med ankle, txted in past by another provider   Past Surgical History:  Procedure Laterality Date   ABDOMINAL HYSTERECTOMY     COLONOSCOPY WITH PROPOFOL N/A 05/26/2020   Procedure: COLONOSCOPY WITH PROPOFOL;  Surgeon: Toledo, Boykin Nearing, MD;  Location: [facility] ENDOSCOPY;  Service: Gastroenterology;  Laterality: N/A;   INCONTINENCE SURGERY     OOPHORECTOMY     TOTAL HIP ARTHROPLASTY Left 12/11/2023   Procedure: TOTAL HIP ARTHROPLASTY ANTERIOR APPROACH;  Surgeon: Tarry Kos, MD;  Location: [facility] OR;  Service: Orthopedics;  Laterality: Left;   Patient Active Problem List   Diagnosis Date Noted   Age-related osteoporosis without current pathological fracture 01/16/2024   Closed subcapital fracture of neck of left femur, initial encounter (HCC) 12/09/2023   PSVT (paroxysmal supraventricular tachycardia) (HCC) 05/27/2023   Recurrent falls 08/16/2022   Hydrocephalus (HCC)  08/16/2022   Depression 08/16/2022   OSA (obstructive sleep apnea) 04/27/2022   Palpitations 10/03/2021   Dyspnea on exertion 04/19/2020   Aortic atherosclerosis (HCC) 04/19/2020   Essential hypertension 04/19/2020   Hyperlipidemia 04/19/2020   Right carpal tunnel syndrome 11/09/2016    PCP: Jerl Mina, MD   REFERRING PROVIDER: Tarry Kos, MD  REFERRING DIAG: [phone] (ICD-10-CM) - S/P total left hip arthroplasty  THERAPY DIAG:  Difficulty in walking, not elsewhere classified  Pain in left hip  Abnormality of gait and mobility  Other abnormalities of gait and mobility  Chronic midline low back pain without sciatica  Rationale for Evaluation and Treatment: Rehabilitation  ONSET DATE: 12/11/2023  SUBJECTIVE:   SUBJECTIVE STATEMENT:  Pt denies any complications since the last visit.  Pt feels as though she has more difficulty with walking when talking.   PERTINENT HISTORY: S/P L THA on 12/11/2023, anterior approach secondary to a fall. [place], pt missed a step on the curb and fell onto her L side resulting in a fracture in L hip. Had home health PT which finished yesterday. Exercises included side stepping, marches, knee flexion, weight shifting, tip toes, steps, SAQ, SLR hip flexion, bridging. Has a hx of back pain  Blood pressure is controlled.  No latex allergies  PAIN:  Are you having pain? Yes: NPRS scale: 0/10 Pain location: L anterior hip at incision Pain description: "pain and numbness" Aggravating factors: not provided Relieving factors: not provided  PRECAUTIONS: Anterior hip and Fall  RED FLAGS: Bowel or bladder incontinence: No and Cauda equina syndrome: No   WEIGHT BEARING RESTRICTIONS: WBAT  FALLS:  Has patient fallen in last 6 months? Yes. Number of falls Most recent fall was December 09, 2023 resulting on L hip fracture S/P THA anterior approach.   LIVING ENVIRONMENT: Lives with: lives with their spouse;  daughter lives 15 minutes away. Lives in: House/apartment, first floor set up Stairs: Yes: Internal: 10 steps; can reach both and External: 0 steps; none Has following equipment at home: Single point cane, Walker - 2 wheeled, Tour manager, and Grab bars  OCCUPATION: Retired   PLOF: Independent  PATIENT GOALS: Be able to negotiate steps normally. Improve strength and balance  NEXT MD VISIT: around February 21, 2024  OBJECTIVE:  Note: Objective measures were completed at Evaluation unless otherwise noted.  DIAGNOSTIC FINDINGS:   PATIENT SURVEYS:  LEFS 30/80 (01/25/2024)  COGNITION: Overall cognitive status: Within functional limits for tasks assessed     SENSATION:   EDEMA:    POSTURE: forward flexed, decreased B hip extension  PALPATION:   LOWER EXTREMITY ROM:  Passive ROM Right eval Left eval  Hip flexion    Hip extension    Hip abduction    Hip adduction    Hip internal rotation    Hip external rotation    Knee flexion    Knee extension    Ankle dorsiflexion    Ankle plantarflexion    Ankle inversion    Ankle eversion     (Blank rows = not tested)  LOWER EXTREMITY MMT:  MMT Right eval Left eval  Hip flexion 4 4  Hip extension (seated manually resisted) 4+ 4+  Hip abduction (seated manually resisted) 4- 3+  Hip adduction    Hip internal rotation    Hip external rotation    Knee flexion    Knee extension 5 4+  Ankle dorsiflexion 4+ 4  Ankle plantarflexion    Ankle inversion    Ankle eversion     (Blank rows = not tested)  LOWER EXTREMITY SPECIAL TESTS:    FUNCTIONAL TESTS:  5 times sit to stand: 13.16 seconds without B UE assist Timed up and go (TUG): 11.26 seconds, 9.79 seconds, 9.28 seconds without use of AD Dynamic Gait Index: 16   GAIT: Distance walked: 30 ft Assistive device utilized: Single point cane Level of assistance: Modified independence Comments: Antalgic, decreased stance L LE, SPC on R, decreased B hip extension, forward  flexed                                                                                                                                TREATMENT DATE: 02/13/24    TherAct:  NuStep seat 8,  arms 8, level 3 for 5 minutes  Standing hip abduction with RTB, 3x10 each LE with UE support on treadmill Standing exaggerated marches with RTB around knees, 2x15 each LE Standing hip extensions with RTB around knees, 2x15 each LE  STS with RTB around the knees, focus on eccentric lowering when sitting.  Lateral crab walks with RTB around knees, 30' each direction x3  Squats with TRX support, focus on placing more weight on the L LE, x15  Exercises above performed to improve the overall strength necessary for keeping balance during upright activities.     Neuromuscular re-education  Standing weight shifts on theraband board, forward/backward 2x10 Standing latera weight shifts on theraband board, x10 each direction, however pt noting it to be too much on her ankles and knee, so was discontinued, Standing cone taps in semi circle, 6 total cones with random color call outs, 2x10 each  Standing step ups at first step on stairs, 2x10 each LE leading, pt reporting increased pain in the L knee during attempts   Improved technique, movement at target joints, use of target muscles after mod verbal, visual, tactile cues.      PATIENT EDUCATION:  Education details: POC Person educated: Patient Education method: Explanation Education comprehension: verbalized understanding  HOME EXERCISE PROGRAM: Access Code: [email or web address] URL: [email or web address] Date: 02/01/2024 Prepared by: Loralyn Freshwater  Exercises - Standing Partial Lunge  - 1 x daily - 7 x weekly - 3 sets - 10 reps - Standing Single Leg Stance with Unilateral Counter Support  - 1 x daily - 7 x weekly - 3 sets - 10 reps - 5 seconds hold - Standing Gastroc Stretch on Step with Counter Support  - 3 x daily - 7 x weekly - 1 sets -  10 reps - 30 seconds hold    ASSESSMENT:  CLINICAL IMPRESSION:  Pt responded well to the exercises, however is limited by knee pain and reporting that the knee is buckling.  Therapist discussed the difference between pain and buckling, however pt unable to differentiate between the two.  Pt educated on weakness of the L LE causing the buckling, however pain needs to be assessed before continuing.  Pt otherwise tolerated the exercises and was able to increase in resistance by using the RTB instead of the yellow like in past sessions.   Pt will continue to benefit from skilled therapy to address remaining deficits in order to improve overall QoL and return to PLOF.       OBJECTIVE IMPAIRMENTS: Abnormal gait, difficulty walking, decreased ROM, decreased strength, improper body mechanics, postural dysfunction, and pain.   ACTIVITY LIMITATIONS: carrying, lifting, standing, squatting, stairs, transfers, and locomotion level  PARTICIPATION LIMITATIONS:   PERSONAL FACTORS: Age, Fitness, Past/current experiences, and 3+ comorbidities: back pain, Dyspnea on exertion, HTN  are also affecting patient's functional outcome.   REHAB POTENTIAL: Fair    CLINICAL DECISION MAKING: Stable/uncomplicated  EVALUATION COMPLEXITY: Low   GOALS: Goals reviewed with patient? Yes  SHORT TERM GOALS: Target date: 02/10/2024 Pt will be independent with her initial HEP to improve strength, function, and ability to ambulate and perform standing tasks with less difficulty.  Baseline: Pt has not yet started her initial HEP (01/25/2024) Goal status: INITIAL    LONG TERM GOALS: Target date: 03/23/2024  Pt will improve her Dynamic Gait Index (DGI) score to 19 or more as a demonstration of improved balance and decreased fall risk.   Baseline: DGI score 16 (01/25/2024) Goal status: INITIAL  2.  Pt  will improve her LEFS score by at least 15 points as a demonstration of improved function.  Baseline: LEFS 30/80  (01/25/2024) Goal status: INITIAL  3.  Pt will improve B glute med and max strength by at least 1/2 MMT grade to promote ability to ambulate and perform standing tasks with less difficulty.  Baseline:  MMT Right eval Left eval  Hip extension (seated manually resisted) 4+ 4+  Hip abduction (seated manually resisted) 4- 3+   Goal status: INITIAL  4.  Pt will improve her 5 times sit <> stand time to 10 seconds or less as a demonstration of improved functional LE strength  Baseline: 5 times sit to stand: 13.16 seconds without B UE assist (01/25/2024) Goal status: INITIAL  5.  Pt will be able to ambulate 500 ft or more without AD and no LOB to promote mobility.  Baseline: Pt currently ambulating with SPC (01/25/2024) Goal status: INITIAL     PLAN:  PT FREQUENCY: 1-2x/week  PT DURATION: 8 weeks  PLANNED INTERVENTIONS: 97110-Therapeutic exercises, 97530- Therapeutic activity, 97112- Neuromuscular re-education, 97535- Self Care, 16109- Manual therapy, [phone]- Gait training, 97014- Electrical stimulation (unattended), [phone]- Ionotophoresis 4mg /ml Dexamethasone, Patient/Family education, Balance training, and Stair training  PLAN FOR NEXT SESSION: trunk and hip strengthening, gait, balance, manual techniques, modalities PRN   Nolon Bussing, PT, DPT Physical Therapist - [facility]  [facility]  02/13/24, 10:19 AM

## 2024-02-13 NOTE — Telephone Encounter (Signed)
 Patient contacted [place] to schedule sleep study, I called over to the sleep lab to see if someone could assist her but noone picked up. I sent a message to sleep scheduler, Camelia Eng, to call patient back to schedule.

## 2024-02-15 ENCOUNTER — Ambulatory Visit: Payer: PPO

## 2024-02-20 ENCOUNTER — Ambulatory Visit: Payer: PPO

## 2024-02-21 ENCOUNTER — Ambulatory Visit: Payer: PPO | Admitting: Dermatology

## 2024-02-21 DIAGNOSIS — Z03818 Encounter for observation for suspected exposure to other biological agents ruled out: Secondary | ICD-10-CM | POA: Diagnosis not present

## 2024-02-21 DIAGNOSIS — R058 Other specified cough: Secondary | ICD-10-CM | POA: Diagnosis not present

## 2024-02-21 DIAGNOSIS — J209 Acute bronchitis, unspecified: Secondary | ICD-10-CM | POA: Diagnosis not present

## 2024-02-22 ENCOUNTER — Ambulatory Visit: Payer: PPO

## 2024-02-23 ENCOUNTER — Encounter: Payer: PPO | Admitting: Physician Assistant

## 2024-02-25 DIAGNOSIS — G4733 Obstructive sleep apnea (adult) (pediatric): Secondary | ICD-10-CM | POA: Diagnosis not present

## 2024-02-27 ENCOUNTER — Ambulatory Visit: Payer: PPO | Attending: Podiatry

## 2024-02-27 DIAGNOSIS — R2689 Other abnormalities of gait and mobility: Secondary | ICD-10-CM | POA: Diagnosis not present

## 2024-02-27 DIAGNOSIS — R262 Difficulty in walking, not elsewhere classified: Secondary | ICD-10-CM | POA: Diagnosis not present

## 2024-02-27 DIAGNOSIS — M25552 Pain in left hip: Secondary | ICD-10-CM | POA: Diagnosis not present

## 2024-02-27 DIAGNOSIS — R269 Unspecified abnormalities of gait and mobility: Secondary | ICD-10-CM | POA: Insufficient documentation

## 2024-02-27 NOTE — Therapy (Signed)
 OUTPATIENT PHYSICAL THERAPY TREATMENT   Patient Name: Michelle Cisneros MRN: [id number] DOB:[birth year], 68 y.o., female Today's Date: 02/27/2024  END OF SESSION:  PT End of Session - 02/27/24 1303     Visit Number 7    Number of Visits 17    Date for PT Re-Evaluation 03/23/24    PT Start Time 1303    PT Stop Time 1341    PT Time Calculation (min) 38 min    Activity Tolerance Patient tolerated treatment well    Behavior During Therapy WFL for tasks assessed/performed               Past Medical History:  Diagnosis Date   Aortic atherosclerosis (HCC)    Arthritis    Back pain    Basal cell carcinoma    L forehead, txted in past by another provider   Collagen vascular disease (HCC)    Depression    DOE (dyspnea on exertion)    a. 05/2020 Echo: EF 60-65%, no rwma, nl RV size/fxn.   History of stress test    a. 04/2020 Lexiscan MV: No ischemia/infarct.   Hyperlipemia    Hypertension    Melanoma (HCC) ~2015   R forearm txted in past by another provider   Recurrent UTI    Squamous cell carcinoma of skin    L med ankle, txted in past by another provider   Past Surgical History:  Procedure Laterality Date   ABDOMINAL HYSTERECTOMY     COLONOSCOPY WITH PROPOFOL N/A 05/26/2020   Procedure: COLONOSCOPY WITH PROPOFOL;  Surgeon: Toledo, Boykin Nearing, MD;  Location: [facility];  Service: Gastroenterology;  Laterality: N/A;   INCONTINENCE SURGERY     OOPHORECTOMY     TOTAL HIP ARTHROPLASTY Left 12/11/2023   Procedure: TOTAL HIP ARTHROPLASTY ANTERIOR APPROACH;  Surgeon: Tarry Kos, MD;  Location: [facility] OR;  Service: Orthopedics;  Laterality: Left;   Patient Active Problem List   Diagnosis Date Noted   Age-related osteoporosis without current pathological fracture 01/16/2024   Closed subcapital fracture of neck of left femur, initial encounter (HCC) 12/09/2023   PSVT (paroxysmal supraventricular tachycardia) (HCC) 05/27/2023   Recurrent falls 08/16/2022   Hydrocephalus (HCC)  08/16/2022   Depression 08/16/2022   OSA (obstructive sleep apnea) 04/27/2022   Palpitations 10/03/2021   Dyspnea on exertion 04/19/2020   Aortic atherosclerosis (HCC) 04/19/2020   Essential hypertension 04/19/2020   Hyperlipidemia 04/19/2020   Right carpal tunnel syndrome 11/09/2016    PCP: Jerl Mina, MD   REFERRING PROVIDER: Tarry Kos, MD  REFERRING DIAG: [phone] (ICD-10-CM) - S/P total left hip arthroplasty  THERAPY DIAG:  Difficulty in walking, not elsewhere classified  Pain in left hip  Abnormality of gait and mobility  Rationale for Evaluation and Treatment: Rehabilitation  ONSET DATE: 12/11/2023  SUBJECTIVE:   SUBJECTIVE STATEMENT: L hip is doing fine. Got the flue from her grand daughter. No L knee pain currently.       PERTINENT HISTORY: S/P L THA on 12/11/2023, anterior approach secondary to a fall. [place], pt missed a step on the curb and fell onto her L side resulting in a fracture in L hip. Had home health PT which finished yesterday. Exercises included side stepping, marches, knee flexion, weight shifting, tip toes, steps, SAQ, SLR hip flexion, bridging. Has a hx of back pain  Blood pressure is controlled.  No latex allergies  PAIN:  Are you having pain? Yes: NPRS scale: 0/10 Pain location: L anterior  hip at incision Pain description: "pain and numbness" Aggravating factors: not provided Relieving factors: not provided  PRECAUTIONS: Anterior hip and Fall  RED FLAGS: Bowel or bladder incontinence: No and Cauda equina syndrome: No   WEIGHT BEARING RESTRICTIONS: WBAT  FALLS:  Has patient fallen in last 6 months? Yes. Number of falls Most recent fall was December 09, 2023 resulting on L hip fracture S/P THA anterior approach.   LIVING ENVIRONMENT: Lives with: lives with their spouse; daughter lives 15 minutes away. Lives in: House/apartment, first floor set up Stairs: Yes: Internal: 10 steps; can reach both and  External: 0 steps; none Has following equipment at home: Single point cane, Walker - 2 wheeled, Tour manager, and Grab bars  OCCUPATION: Retired   PLOF: Independent  PATIENT GOALS: Be able to negotiate steps normally. Improve strength and balance  NEXT MD VISIT: around February 21, 2024  OBJECTIVE:  Note: Objective measures were completed at Evaluation unless otherwise noted.  DIAGNOSTIC FINDINGS:   PATIENT SURVEYS:  LEFS 30/80 (01/25/2024)  COGNITION: Overall cognitive status: Within functional limits for tasks assessed     SENSATION:   EDEMA:    POSTURE: forward flexed, decreased B hip extension  PALPATION:   LOWER EXTREMITY ROM:  Passive ROM Right eval Left eval  Hip flexion    Hip extension    Hip abduction    Hip adduction    Hip internal rotation    Hip external rotation    Knee flexion    Knee extension    Ankle dorsiflexion    Ankle plantarflexion    Ankle inversion    Ankle eversion     (Blank rows = not tested)  LOWER EXTREMITY MMT:  MMT Right eval Left eval  Hip flexion 4 4  Hip extension (seated manually resisted) 4+ 4+  Hip abduction (seated manually resisted) 4- 3+  Hip adduction    Hip internal rotation    Hip external rotation    Knee flexion    Knee extension 5 4+  Ankle dorsiflexion 4+ 4  Ankle plantarflexion    Ankle inversion    Ankle eversion     (Blank rows = not tested)  LOWER EXTREMITY SPECIAL TESTS:    FUNCTIONAL TESTS:  5 times sit to stand: 13.16 seconds without B UE assist Timed up and go (TUG): 11.26 seconds, 9.79 seconds, 9.28 seconds without use of AD Dynamic Gait Index: 16   GAIT: Distance walked: 30 ft Assistive device utilized: Single point cane Level of assistance: Modified independence Comments: Antalgic, decreased stance L LE, SPC on R, decreased B hip extension, forward flexed                                                                                                                                 TREATMENT DATE: 02/27/24   Therapeutic exercises  NuStep seat 8, arms 8, level 3 for 5 minutes  Standing hip abduction with RTB, 10x,  then 10x5 seconds  each LE with UE support on treadmill  Static mini lunge with red band around knees with contralateral UE assist   L 10x3, cues for femoral control   Side stepping with red band around knees 30 ft to the R and 30 ft to the L for 3 sets  Forward step up onto first reagular step with red band around knees with B UE assist  L 10x  Stepping over 4 mini hurdles 10x2  CGA  Did not clear back/trail foot 3x total (R and L foot)  Standing B gastroc stretch at stair step with B UE assist 40 seconds x 3  To promote foot clearance during obstacle negotiation.   Forward sled push 30 ft x 4    Improved technique, movement at target joints, use of target muscles after mod verbal, visual, tactile cues.      PATIENT EDUCATION:  Education details: POC Person educated: Patient Education method: Explanation Education comprehension: verbalized understanding  HOME EXERCISE PROGRAM: Access Code: [email or web address] URL: [email or web address] Date: 02/01/2024 Prepared by: Loralyn Freshwater  Exercises - Standing Partial Lunge  - 1 x daily - 7 x weekly - 3 sets - 10 reps - Standing Single Leg Stance with Unilateral Counter Support  - 1 x daily - 7 x weekly - 3 sets - 10 reps - 5 seconds hold - Standing Gastroc Stretch on Step with Counter Support  - 3 x daily - 7 x weekly - 1 sets - 10 reps - 30 seconds hold    ASSESSMENT:  CLINICAL IMPRESSION:  Continued working on L LE strengthening and femoral control to improve function and balance at home. No reports of L hip and knee pain or weakness after session. Able to clear obstacles overall, however, trailing foot tends to trip onto the mini hurdles at times in which limited ankle DF may play a factor. Continued working on B gastroc stretch to help address. Pt tolerated session well without  aggravation of symptoms. Pt will benefit from continued skilled physical therapy services to improve strength, balance, function, and ability to ambulate with less difficulty.        OBJECTIVE IMPAIRMENTS: Abnormal gait, difficulty walking, decreased ROM, decreased strength, improper body mechanics, postural dysfunction, and pain.   ACTIVITY LIMITATIONS: carrying, lifting, standing, squatting, stairs, transfers, and locomotion level  PARTICIPATION LIMITATIONS:   PERSONAL FACTORS: Age, Fitness, Past/current experiences, and 3+ comorbidities: back pain, Dyspnea on exertion, HTN  are also affecting patient's functional outcome.   REHAB POTENTIAL: Fair    CLINICAL DECISION MAKING: Stable/uncomplicated  EVALUATION COMPLEXITY: Low   GOALS: Goals reviewed with patient? Yes  SHORT TERM GOALS: Target date: 02/10/2024 Pt will be independent with her initial HEP to improve strength, function, and ability to ambulate and perform standing tasks with less difficulty.  Baseline: Pt has not yet started her initial HEP (01/25/2024) Goal status: INITIAL    LONG TERM GOALS: Target date: 03/23/2024  Pt will improve her Dynamic Gait Index (DGI) score to 19 or more as a demonstration of improved balance and decreased fall risk.   Baseline: DGI score 16 (01/25/2024) Goal status: INITIAL  2.  Pt will improve her LEFS score by at least 15 points as a demonstration of improved function.  Baseline: LEFS 30/80 (01/25/2024) Goal status: INITIAL  3.  Pt will improve B glute med and max strength by at least 1/2 MMT grade to promote ability to ambulate and perform standing tasks with less difficulty.  Baseline:  MMT Right  eval Left eval  Hip extension (seated manually resisted) 4+ 4+  Hip abduction (seated manually resisted) 4- 3+   Goal status: INITIAL  4.  Pt will improve her 5 times sit <> stand time to 10 seconds or less as a demonstration of improved functional LE strength  Baseline: 5 times sit to  stand: 13.16 seconds without B UE assist (01/25/2024) Goal status: INITIAL  5.  Pt will be able to ambulate 500 ft or more without AD and no LOB to promote mobility.  Baseline: Pt currently ambulating with SPC (01/25/2024) Goal status: INITIAL     PLAN:  PT FREQUENCY: 1-2x/week  PT DURATION: 8 weeks  PLANNED INTERVENTIONS: 97110-Therapeutic exercises, 97530- Therapeutic activity, 97112- Neuromuscular re-education, 97535- Self Care, 95188- Manual therapy, [phone]- Gait training, 97014- Electrical stimulation (unattended), [phone]- Ionotophoresis 4mg /ml Dexamethasone, Patient/Family education, Balance training, and Stair training  PLAN FOR NEXT SESSION: trunk and hip strengthening, gait, balance, manual techniques, modalities PRN   Loralyn Freshwater PT, DPT  Physical Therapist - [facility]  [facility]  02/27/24, 3:44 PM

## 2024-02-28 ENCOUNTER — Encounter: Payer: Self-pay | Admitting: Physician Assistant

## 2024-02-28 ENCOUNTER — Telehealth: Payer: Self-pay

## 2024-02-28 ENCOUNTER — Ambulatory Visit (INDEPENDENT_AMBULATORY_CARE_PROVIDER_SITE_OTHER): Payer: PPO | Admitting: Physician Assistant

## 2024-02-28 DIAGNOSIS — Z96642 Presence of left artificial hip joint: Secondary | ICD-10-CM

## 2024-02-28 NOTE — Telephone Encounter (Signed)
 I called the patient to let her know she may call the [facility] @ [facility] directly, since they can see the order in epic (I did also fax it to them, just to be sure). Ph [phone] & fax #[phone]. The patient said she will be calling them soon to schedule this dexascan.

## 2024-02-28 NOTE — Progress Notes (Signed)
 Post-Op Visit Note   Patient: Michelle Cisneros           Date of Birth: [birth year]           MRN: [id number] Visit Date: 02/28/2024 PCP: Jerl Mina, MD   Assessment & Plan:  Chief Complaint:  Chief Complaint  Patient presents with   Left Hip - Follow-up    Left total hip arthroplasty 12/11/2023   Visit Diagnoses:  1. S/P total left hip arthroplasty     Plan: Patient is a pleasant 68 year old female who comes in today approximately 3 months status post left total hip replacement from a femoral neck fracture, date of surgery 12/11/2023.  She has been doing great.  No complaints pain.  She is in physical therapy working on strengthening exercises.  Overall feeling great.  Left hip exam reveals painless hip flexion and logroll.  She is neurovascularly intact distally.  At this point, she may continue to advance with activity as tolerated.  Dental prophylaxis reinforced for 2 years postop.  Due to this being a hip fracture, we have already made a referral to [facility]  She will follow-up with her as needed for this.  Follow-up with [place] as needed.  Call with concerns or questions.  Follow-Up Instructions: Return if symptoms worsen or fail to improve.   Orders:  No orders of the defined types were placed in this encounter.  No orders of the defined types were placed in this encounter.   Imaging: No new imaging  PMFS History: Patient Active Problem List   Diagnosis Date Noted   Age-related osteoporosis without current pathological fracture 01/16/2024   Closed subcapital fracture of neck of left femur, initial encounter (HCC) 12/09/2023   PSVT (paroxysmal supraventricular tachycardia) (HCC) 05/27/2023   Recurrent falls 08/16/2022   Hydrocephalus (HCC) 08/16/2022   Depression 08/16/2022   OSA (obstructive sleep apnea) 04/27/2022   Palpitations 10/03/2021   Dyspnea on exertion 04/19/2020   Aortic atherosclerosis (HCC) 04/19/2020   Essential hypertension 04/19/2020    Hyperlipidemia 04/19/2020   Right carpal tunnel syndrome 11/09/2016   Past Medical History:  Diagnosis Date   Aortic atherosclerosis (HCC)    Arthritis    Back pain    Basal cell carcinoma    L forehead, txted in past by another provider   Collagen vascular disease (HCC)    Depression    DOE (dyspnea on exertion)    a. 05/2020 Echo: EF 60-65%, no rwma, nl RV size/fxn.   History of stress test    a. 04/2020 Lexiscan MV: No ischemia/infarct.   Hyperlipemia    Hypertension    Melanoma (HCC) ~2015   R forearm txted in past by another provider   Recurrent UTI    Squamous cell carcinoma of skin    L med ankle, txted in past by another provider    Family History  Problem Relation Age of Onset   Cancer Father        laryngeal   Atrial fibrillation Father    Stroke Mother    Breast cancer Neg Hx    Bladder Cancer Neg Hx    Kidney cancer Neg Hx     Past Surgical History:  Procedure Laterality Date   ABDOMINAL HYSTERECTOMY     COLONOSCOPY WITH PROPOFOL N/A 05/26/2020   Procedure: COLONOSCOPY WITH PROPOFOL;  Surgeon: Toledo, Boykin Nearing, MD;  Location: [facility];  Service: Gastroenterology;  Laterality: N/A;   INCONTINENCE SURGERY     OOPHORECTOMY     TOTAL  HIP ARTHROPLASTY Left 12/11/2023   Procedure: TOTAL HIP ARTHROPLASTY ANTERIOR APPROACH;  Surgeon: Tarry Kos, MD;  Location: [facility] OR;  Service: Orthopedics;  Laterality: Left;   Social History   Occupational History   Not on file  Tobacco Use   Smoking status: Former    Current packs/day: 0.00    Average packs/day: 1 pack/day for 30.0 years (30.0 ttl pk-yrs)    Types: Cigarettes    Start date: 10/03/1957    Quit date: 10/04/1987    Years since quitting: 36.4    Passive exposure: Past   Smokeless tobacco: Never  Vaping Use   Vaping status: Never Used  Substance and Sexual Activity   Alcohol use: Yes    Comment: occassional; once glass of wine   Drug use: Not Currently   Sexual activity: Not on file

## 2024-02-28 NOTE — Telephone Encounter (Signed)
 Patient is here seeing Mardella Layman today. She said that [place] ordered a DEXA, but still has not heard anything about scheduling this. Can you check and let patient know? Thanks!

## 2024-02-29 ENCOUNTER — Ambulatory Visit: Payer: PPO

## 2024-02-29 ENCOUNTER — Ambulatory Visit

## 2024-02-29 DIAGNOSIS — R2689 Other abnormalities of gait and mobility: Secondary | ICD-10-CM

## 2024-02-29 DIAGNOSIS — R269 Unspecified abnormalities of gait and mobility: Secondary | ICD-10-CM

## 2024-02-29 DIAGNOSIS — R262 Difficulty in walking, not elsewhere classified: Secondary | ICD-10-CM | POA: Diagnosis not present

## 2024-02-29 DIAGNOSIS — M25552 Pain in left hip: Secondary | ICD-10-CM

## 2024-02-29 NOTE — Therapy (Unsigned)
 OUTPATIENT PHYSICAL THERAPY TREATMENT   Patient Name: Shallen Luedke MRN: [id number] DOB:27-Nov-1956, 68 y.o., female Today's Date: 03/01/2024  END OF SESSION:  PT End of Session - 02/29/24 1519     Visit Number 8    Number of Visits 17    Date for PT Re-Evaluation 03/23/24    PT Start Time 1517    PT Stop Time 1600    PT Time Calculation (min) 43 min    Activity Tolerance Patient tolerated treatment well    Behavior During Therapy WFL for tasks assessed/performed               Past Medical History:  Diagnosis Date   Aortic atherosclerosis (HCC)    Arthritis    Back pain    Basal cell carcinoma    L forehead, txted in past by another provider   Collagen vascular disease (HCC)    Depression    DOE (dyspnea on exertion)    a. 05/2020 Echo: EF 60-65%, no rwma, nl RV size/fxn.   History of stress test    a. 04/2020 Lexiscan MV: No ischemia/infarct.   Hyperlipemia    Hypertension    Melanoma (HCC) ~2015   R forearm txted in past by another provider   Recurrent UTI    Squamous cell carcinoma of skin    L med ankle, txted in past by another provider   Past Surgical History:  Procedure Laterality Date   ABDOMINAL HYSTERECTOMY     COLONOSCOPY WITH PROPOFOL N/A 05/26/2020   Procedure: COLONOSCOPY WITH PROPOFOL;  Surgeon: Toledo, Boykin Nearing, MD;  Location: [facility] ENDOSCOPY;  Service: Gastroenterology;  Laterality: N/A;   INCONTINENCE SURGERY     OOPHORECTOMY     TOTAL HIP ARTHROPLASTY Left 12/11/2023   Procedure: TOTAL HIP ARTHROPLASTY ANTERIOR APPROACH;  Surgeon: Tarry Kos, MD;  Location: [facility] OR;  Service: Orthopedics;  Laterality: Left;   Patient Active Problem List   Diagnosis Date Noted   Age-related osteoporosis without current pathological fracture 01/16/2024   Closed subcapital fracture of neck of left femur, initial encounter (HCC) 12/09/2023   PSVT (paroxysmal supraventricular tachycardia) (HCC) 05/27/2023   Recurrent falls 08/16/2022   Hydrocephalus (HCC)  08/16/2022   Depression 08/16/2022   OSA (obstructive sleep apnea) 04/27/2022   Palpitations 10/03/2021   Dyspnea on exertion 04/19/2020   Aortic atherosclerosis (HCC) 04/19/2020   Essential hypertension 04/19/2020   Hyperlipidemia 04/19/2020   Right carpal tunnel syndrome 11/09/2016    PCP: Jerl Mina, MD   REFERRING PROVIDER: Tarry Kos, MD  REFERRING DIAG: [phone] (ICD-10-CM) - S/P total left hip arthroplasty  THERAPY DIAG:  Difficulty in walking, not elsewhere classified  Pain in left hip  Abnormality of gait and mobility  Other abnormalities of gait and mobility  Rationale for Evaluation and Treatment: Rehabilitation  ONSET DATE: 12/11/2023  SUBJECTIVE:   SUBJECTIVE STATEMENT: Reports overall doing well. Denies falls.    PERTINENT HISTORY: S/P L THA on 12/11/2023, anterior approach secondary to a fall. [place], pt missed a step on the curb and fell onto her L side resulting in a fracture in L hip. Had home health PT which finished yesterday. Exercises included side stepping, marches, knee flexion, weight shifting, tip toes, steps, SAQ, SLR hip flexion, bridging. Has a hx of back pain  Blood pressure is controlled.  No latex allergies  PAIN:  Are you having pain? Yes: NPRS scale: 0/10 Pain location: L anterior hip at incision Pain description: "pain and  numbness" Aggravating factors: not provided Relieving factors: not provided  PRECAUTIONS: Anterior hip and Fall  RED FLAGS: Bowel or bladder incontinence: No and Cauda equina syndrome: No   WEIGHT BEARING RESTRICTIONS: WBAT  FALLS:  Has patient fallen in last 6 months? Yes. Number of falls Most recent fall was December 09, 2023 resulting on L hip fracture S/P THA anterior approach.   LIVING ENVIRONMENT: Lives with: lives with their spouse; daughter lives 15 minutes away. Lives in: House/apartment, first floor set up Stairs: Yes: Internal: 10 steps; can reach both and  External: 0 steps; none Has following equipment at home: Single point cane, Walker - 2 wheeled, Tour manager, and Grab bars  OCCUPATION: Retired   PLOF: Independent  PATIENT GOALS: Be able to negotiate steps normally. Improve strength and balance  NEXT MD VISIT: around February 21, 2024  OBJECTIVE:  Note: Objective measures were completed at Evaluation unless otherwise noted.  DIAGNOSTIC FINDINGS:   PATIENT SURVEYS:  LEFS 30/80 (01/25/2024)  COGNITION: Overall cognitive status: Within functional limits for tasks assessed     SENSATION:   EDEMA:    POSTURE: forward flexed, decreased B hip extension  PALPATION:   LOWER EXTREMITY ROM:  Passive ROM Right eval Left eval  Hip flexion    Hip extension    Hip abduction    Hip adduction    Hip internal rotation    Hip external rotation    Knee flexion    Knee extension    Ankle dorsiflexion    Ankle plantarflexion    Ankle inversion    Ankle eversion     (Blank rows = not tested)  LOWER EXTREMITY MMT:  MMT Right eval Left eval  Hip flexion 4 4  Hip extension (seated manually resisted) 4+ 4+  Hip abduction (seated manually resisted) 4- 3+  Hip adduction    Hip internal rotation    Hip external rotation    Knee flexion    Knee extension 5 4+  Ankle dorsiflexion 4+ 4  Ankle plantarflexion    Ankle inversion    Ankle eversion     (Blank rows = not tested)  LOWER EXTREMITY SPECIAL TESTS:    FUNCTIONAL TESTS:  5 times sit to stand: 13.16 seconds without B UE assist Timed up and go (TUG): 11.26 seconds, 9.79 seconds, 9.28 seconds without use of AD Dynamic Gait Index: 16   GAIT: Distance walked: 30 ft Assistive device utilized: Single point cane Level of assistance: Modified independence Comments: Antalgic, decreased stance L LE, SPC on R, decreased B hip extension, forward flexed                                                                                                                                 TREATMENT DATE: 03/01/24   Therapeutic exercises  NuStep seat 8, arms 8, level 3 for 3 minutes. Not billed.  There.Act:     Standing hip abduction with RTB: x12,  LLE. Progressed to GTB, 3x12/LE.    Lateral step ups RLE eccentric lowering to airex pad: 2x8. LLE with significant weakness and unable to perform. Reduced to X6 laps asc/desc 4 steps side stepping.     Forward x6 hurdles reciprocal pattern. X6 laps CGA. X6 laps with 3# AW's donned. SBA.    Side stepping x6 hurdle step to pattern step overs: x6 laps. X6 laps with 3# AW's donned. SBA   Lunges with RTB at knees. 2x8/side. Pt unable to flex L knee into lunge without buckling of LLE due to significant eccentric weakness of quadriceps.     PATIENT EDUCATION:  Education details: POC Person educated: Patient Education method: Explanation Education comprehension: verbalized understanding  HOME EXERCISE PROGRAM: Access Code: [email or web address] URL: [email or web address] Date: 02/01/2024 Prepared by: Loralyn Freshwater  Exercises - Standing Partial Lunge  - 1 x daily - 7 x weekly - 3 sets - 10 reps - Standing Single Leg Stance with Unilateral Counter Support  - 1 x daily - 7 x weekly - 3 sets - 10 reps - 5 seconds hold - Standing Gastroc Stretch on Step with Counter Support  - 3 x daily - 7 x weekly - 1 sets - 10 reps - 30 seconds hold    ASSESSMENT:  CLINICAL IMPRESSION: Continuing primary PT POC addressing proximal hip strength, SLS and foot clearance activities. Pt able to progress in resistance with hip abduction and foot clearance exercise with minimal difficulty tripping over obstacles. Covering PT however discovering pt with significant quad weakness in LLE compared to RLE with inability to eccentrically control from 6" step down to airex pad or perform a lunge without keeping L knee in extremely limited flexion without buckling of limb. Notable quad atrophy also noted in LLE compared to RLE. Encourage future  sessions to address quad weakness as this appears to be pt's biggest current deficit and leas to risk for falls with step up obstacles. Pt will benefit from continued skilled physical therapy services to improve strength, balance, function, and ability to ambulate with less difficulty.      OBJECTIVE IMPAIRMENTS: Abnormal gait, difficulty walking, decreased ROM, decreased strength, improper body mechanics, postural dysfunction, and pain.   ACTIVITY LIMITATIONS: carrying, lifting, standing, squatting, stairs, transfers, and locomotion level  PARTICIPATION LIMITATIONS:   PERSONAL FACTORS: Age, Fitness, Past/current experiences, and 3+ comorbidities: back pain, Dyspnea on exertion, HTN  are also affecting patient's functional outcome.   REHAB POTENTIAL: Fair    CLINICAL DECISION MAKING: Stable/uncomplicated  EVALUATION COMPLEXITY: Low   GOALS: Goals reviewed with patient? Yes  SHORT TERM GOALS: Target date: 02/10/2024 Pt will be independent with her initial HEP to improve strength, function, and ability to ambulate and perform standing tasks with less difficulty.  Baseline: Pt has not yet started her initial HEP (01/25/2024) Goal status: INITIAL    LONG TERM GOALS: Target date: 03/23/2024  Pt will improve her Dynamic Gait Index (DGI) score to 19 or more as a demonstration of improved balance and decreased fall risk.   Baseline: DGI score 16 (01/25/2024) Goal status: INITIAL  2.  Pt will improve her LEFS score by at least 15 points as a demonstration of improved function.  Baseline: LEFS 30/80 (01/25/2024) Goal status: INITIAL  3.  Pt will improve B glute med and max strength by at least 1/2 MMT grade to promote ability to ambulate and perform standing tasks with less difficulty.  Baseline:  MMT Right eval Left eval  Hip extension (seated manually resisted) 4+ 4+  Hip abduction (seated manually resisted) 4- 3+   Goal status: INITIAL  4.  Pt will improve her 5 times sit <> stand  time to 10 seconds or less as a demonstration of improved functional LE strength  Baseline: 5 times sit to stand: 13.16 seconds without B UE assist (01/25/2024) Goal status: INITIAL  5.  Pt will be able to ambulate 500 ft or more without AD and no LOB to promote mobility.  Baseline: Pt currently ambulating with SPC (01/25/2024) Goal status: INITIAL     PLAN:  PT FREQUENCY: 1-2x/week  PT DURATION: 8 weeks  PLANNED INTERVENTIONS: 97110-Therapeutic exercises, 97530- Therapeutic activity, [id number]- Neuromuscular re-education, 97535- Self Care, 10272- Manual therapy, [id number]- Gait training, 97014- Electrical stimulation (unattended), 97033- Ionotophoresis 4mg /ml Dexamethasone, Patient/Family education, Balance training, and Stair training  PLAN FOR NEXT SESSION: LLE Quad strength. Assess 1RM or 3 RM RLE versus LLE.   Delphia Grates. Fairly IV, PT, DPT Physical Therapist- [place]  [facility]  03/01/24, 8:05 AM

## 2024-03-05 ENCOUNTER — Ambulatory Visit: Payer: PPO

## 2024-03-05 DIAGNOSIS — M25552 Pain in left hip: Secondary | ICD-10-CM

## 2024-03-05 DIAGNOSIS — R269 Unspecified abnormalities of gait and mobility: Secondary | ICD-10-CM

## 2024-03-05 DIAGNOSIS — R262 Difficulty in walking, not elsewhere classified: Secondary | ICD-10-CM

## 2024-03-05 NOTE — Therapy (Signed)
 OUTPATIENT PHYSICAL THERAPY TREATMENT   Patient Name: Michelle Cisneros MRN: [id number] DOB:02-May-1956, 68 y.o., female Today's Date: 03/05/2024  END OF SESSION:  PT End of Session - 03/05/24 1301     Visit Number 9    Number of Visits 17    Date for PT Re-Evaluation 03/23/24    PT Start Time 1302    PT Stop Time 1342    PT Time Calculation (min) 40 min    Activity Tolerance Patient tolerated treatment well    Behavior During Therapy [facility] for tasks assessed/performed                Past Medical History:  Diagnosis Date   Aortic atherosclerosis (HCC)    Arthritis    Back pain    Basal cell carcinoma    L forehead, txted in past by another provider   Collagen vascular disease (HCC)    Depression    DOE (dyspnea on exertion)    a. 05/2020 Echo: EF 60-65%, no rwma, nl RV size/fxn.   History of stress test    a. 04/2020 Lexiscan MV: No ischemia/infarct.   Hyperlipemia    Hypertension    Melanoma (HCC) ~2015   R forearm txted in past by another provider   Recurrent UTI    Squamous cell carcinoma of skin    L med ankle, txted in past by another provider   Past Surgical History:  Procedure Laterality Date   ABDOMINAL HYSTERECTOMY     COLONOSCOPY WITH PROPOFOL N/A 05/26/2020   Procedure: COLONOSCOPY WITH PROPOFOL;  Surgeon: Toledo, Boykin Nearing, MD;  Location: [facility] ENDOSCOPY;  Service: Gastroenterology;  Laterality: N/A;   INCONTINENCE SURGERY     OOPHORECTOMY     TOTAL HIP ARTHROPLASTY Left 12/11/2023   Procedure: TOTAL HIP ARTHROPLASTY ANTERIOR APPROACH;  Surgeon: Tarry Kos, MD;  Location: [facility] OR;  Service: Orthopedics;  Laterality: Left;   Patient Active Problem List   Diagnosis Date Noted   Age-related osteoporosis without current pathological fracture 01/16/2024   Closed subcapital fracture of neck of left femur, initial encounter (HCC) 12/09/2023   PSVT (paroxysmal supraventricular tachycardia) (HCC) 05/27/2023   Recurrent falls 08/16/2022   Hydrocephalus  (HCC) 08/16/2022   Depression 08/16/2022   OSA (obstructive sleep apnea) 04/27/2022   Palpitations 10/03/2021   Dyspnea on exertion 04/19/2020   Aortic atherosclerosis (HCC) 04/19/2020   Essential hypertension 04/19/2020   Hyperlipidemia 04/19/2020   Right carpal tunnel syndrome 11/09/2016    PCP: Jerl Mina, MD   REFERRING PROVIDER: Tarry Kos, MD  REFERRING DIAG: [phone] (ICD-10-CM) - S/P total left hip arthroplasty  THERAPY DIAG:  Difficulty in walking, not elsewhere classified  Pain in left hip  Abnormality of gait and mobility  Rationale for Evaluation and Treatment: Rehabilitation  ONSET DATE: 12/11/2023  SUBJECTIVE:   SUBJECTIVE STATEMENT: L hip is doing fine. No problems with the hip.      PERTINENT HISTORY: S/P L THA on 12/11/2023, anterior approach secondary to a fall. [place], pt missed a step on the curb and fell onto her L side resulting in a fracture in L hip. Had home health PT which finished yesterday. Exercises included side stepping, marches, knee flexion, weight shifting, tip toes, steps, SAQ, SLR hip flexion, bridging. Has a hx of back pain  Blood pressure is controlled.  No latex allergies  PAIN:  Are you having pain? Yes: NPRS scale: 0/10 Pain location: L anterior hip at incision Pain description: "pain and  numbness" Aggravating factors: not provided Relieving factors: not provided  PRECAUTIONS: Anterior hip and Fall  RED FLAGS: Bowel or bladder incontinence: No and Cauda equina syndrome: No   WEIGHT BEARING RESTRICTIONS: WBAT  FALLS:  Has patient fallen in last 6 months? Yes. Number of falls Most recent fall was December 09, 2023 resulting on L hip fracture S/P THA anterior approach.   LIVING ENVIRONMENT: Lives with: lives with their spouse; daughter lives 15 minutes away. Lives in: House/apartment, first floor set up Stairs: Yes: Internal: 10 steps; can reach both and External: 0 steps; none Has  following equipment at home: Single point cane, Walker - 2 wheeled, Tour manager, and Grab bars  OCCUPATION: Retired   PLOF: Independent  PATIENT GOALS: Be able to negotiate steps normally. Improve strength and balance  NEXT MD VISIT: around February 21, 2024  OBJECTIVE:  Note: Objective measures were completed at Evaluation unless otherwise noted.  DIAGNOSTIC FINDINGS:   PATIENT SURVEYS:  LEFS 30/80 (01/25/2024)  COGNITION: Overall cognitive status: Within functional limits for tasks assessed     SENSATION:   EDEMA:    POSTURE: forward flexed, decreased B hip extension  PALPATION:   LOWER EXTREMITY ROM:  Passive ROM Right eval Left eval  Hip flexion    Hip extension    Hip abduction    Hip adduction    Hip internal rotation    Hip external rotation    Knee flexion    Knee extension    Ankle dorsiflexion    Ankle plantarflexion    Ankle inversion    Ankle eversion     (Blank rows = not tested)  LOWER EXTREMITY MMT:  MMT Right eval Left eval  Hip flexion 4 4  Hip extension (seated manually resisted) 4+ 4+  Hip abduction (seated manually resisted) 4- 3+  Hip adduction    Hip internal rotation    Hip external rotation    Knee flexion    Knee extension 5 4+  Ankle dorsiflexion 4+ 4  Ankle plantarflexion    Ankle inversion    Ankle eversion     (Blank rows = not tested)  LOWER EXTREMITY SPECIAL TESTS:    FUNCTIONAL TESTS:  5 times sit to stand: 13.16 seconds without B UE assist Timed up and go (TUG): 11.26 seconds, 9.79 seconds, 9.28 seconds without use of AD Dynamic Gait Index: 16   GAIT: Distance walked: 30 ft Assistive device utilized: Single point cane Level of assistance: Modified independence Comments: Antalgic, decreased stance L LE, SPC on R, decreased B hip extension, forward flexed                                                                                                                                TREATMENT DATE:  03/05/24  There.Act:                 Standing with B UE assist,   Green band around knees  Hip abduction     R 12x3    L 12x3    Standing static mini lunge L 10x    L knee discomfort   Forward step up onto first regular step with B UE assist    L 10x. Difficulty with eccentric lower secondary to weakness   Seated LAQ 3 lbs   L 10x5 seconds with eccentric lowering  To promote ability to negotiate stairs with less difficulty.    Lateral step down from Air Ex pad with eccentric lowering with B UE assist   L 5x. Difficulty performing  Standing tandem stance with B UE assist   L foot forward   Mini squat 10x5 to improve closed chain L quadriceps strength  Seated leg press seat 5  L plate 5 for [id number]   Can perform at the [employer]  Standing gastroc stretch at stair step with B UE assist 1 minute x 2  SLS with B UE assist  L with slight knee flexion 10x10 seconds     Improved technique, movement at target joints, use of target muscles after mod verbal, visual, tactile cues.      PATIENT EDUCATION:  Education details: POC Person educated: Patient Education method: Explanation Education comprehension: verbalized understanding  HOME EXERCISE PROGRAM: Access Code: [email or web address] URL: [email or web address] Date: 02/01/2024 Prepared by: Loralyn Freshwater  Exercises - Standing Partial Lunge  - 1 x daily - 7 x weekly - 3 sets - 10 reps - Standing Single Leg Stance with Unilateral Counter Support  - 1 x daily - 7 x weekly - 3 sets - 10 reps - 5 seconds hold - Standing Gastroc Stretch on Step with Counter Support  - 3 x daily - 7 x weekly - 1 sets - 10 reps - 30 seconds hold  - Seated Long Arc Quad with Ankle Weight  - 1 x daily - 7 x weekly - 3 sets - 10-15 reps - 5 seconds hold  Seated leg press seat 5  L plate 5 for 11B1   Can perform at the [employer]  - Standing Theme park manager on Step with Counter Support  - 3 x daily - 7 x weekly - 1 sets - 3 reps - 60 seconds  hold   ASSESSMENT:  CLINICAL IMPRESSION:  Worked on improving L LE strength especially her quadriceps secondary to closed chain weakness with difficulty performing L knee flexion in closed chain. Improved ability to do so after quadriceps strengthening. Pt tolerated session well without aggravation of symptoms. Pt will benefit from continued skilled physical therapy services to improve strength, balance, function, and ability to ambulate with less difficulty.        OBJECTIVE IMPAIRMENTS: Abnormal gait, difficulty walking, decreased ROM, decreased strength, improper body mechanics, postural dysfunction, and pain.   ACTIVITY LIMITATIONS: carrying, lifting, standing, squatting, stairs, transfers, and locomotion level  PARTICIPATION LIMITATIONS:   PERSONAL FACTORS: Age, Fitness, Past/current experiences, and 3+ comorbidities: back pain, Dyspnea on exertion, HTN  are also affecting patient's functional outcome.   REHAB POTENTIAL: Fair    CLINICAL DECISION MAKING: Stable/uncomplicated  EVALUATION COMPLEXITY: Low   GOALS: Goals reviewed with patient? Yes  SHORT TERM GOALS: Target date: 02/10/2024 Pt will be independent with her initial HEP to improve strength, function, and ability to ambulate and perform standing tasks with less difficulty.  Baseline: Pt has not yet started her initial HEP (01/25/2024) Goal status: INITIAL    LONG TERM GOALS: Target date: 03/23/2024  Pt will improve her Dynamic Gait  Index (DGI) score to 19 or more as a demonstration of improved balance and decreased fall risk.   Baseline: DGI score 16 (01/25/2024) Goal status: INITIAL  2.  Pt will improve her LEFS score by at least 15 points as a demonstration of improved function.  Baseline: LEFS 30/80 (01/25/2024) Goal status: INITIAL  3.  Pt will improve B glute med and max strength by at least 1/2 MMT grade to promote ability to ambulate and perform standing tasks with less difficulty.  Baseline:  MMT  Right eval Left eval  Hip extension (seated manually resisted) 4+ 4+  Hip abduction (seated manually resisted) 4- 3+   Goal status: INITIAL  4.  Pt will improve her 5 times sit <> stand time to 10 seconds or less as a demonstration of improved functional LE strength  Baseline: 5 times sit to stand: 13.16 seconds without B UE assist (01/25/2024) Goal status: INITIAL  5.  Pt will be able to ambulate 500 ft or more without AD and no LOB to promote mobility.  Baseline: Pt currently ambulating with SPC (01/25/2024) Goal status: INITIAL     PLAN:  PT FREQUENCY: 1-2x/week  PT DURATION: 8 weeks  PLANNED INTERVENTIONS: 97110-Therapeutic exercises, 97530- Therapeutic activity, 97112- Neuromuscular re-education, 97535- Self Care, 14782- Manual therapy, [phone]- Gait training, 97014- Electrical stimulation (unattended), [phone]- Ionotophoresis 4mg /ml Dexamethasone, Patient/Family education, Balance training, and Stair training  PLAN FOR NEXT SESSION: trunk and hip strengthening, gait, balance, manual techniques, modalities PRN   Loralyn Freshwater PT, DPT  Physical Therapist - [facility]  03/05/24, 2:39 PM

## 2024-03-07 ENCOUNTER — Ambulatory Visit: Payer: PPO

## 2024-03-07 ENCOUNTER — Ambulatory Visit
Admission: RE | Admit: 2024-03-07 | Discharge: 2024-03-07 | Disposition: A | Discharge status: Home / Self Care | Source: Ambulatory Visit | Attending: Physician Assistant | Admitting: Physician Assistant

## 2024-03-07 DIAGNOSIS — M810 Age-related osteoporosis without current pathological fracture: Secondary | ICD-10-CM | POA: Diagnosis not present

## 2024-03-12 ENCOUNTER — Ambulatory Visit: Payer: PPO

## 2024-03-12 DIAGNOSIS — M25552 Pain in left hip: Secondary | ICD-10-CM

## 2024-03-12 DIAGNOSIS — R262 Difficulty in walking, not elsewhere classified: Secondary | ICD-10-CM

## 2024-03-12 NOTE — Therapy (Signed)
 OUTPATIENT PHYSICAL THERAPY TREATMENT And Progress Report (01/25/2024 - 03/12/2024)   Patient Name: Michelle Cisneros MRN: [id number] DOB:February 26, 1956, 68 y.o., female Today's Date: 03/12/2024  END OF SESSION:  PT End of Session - 03/12/24 1519     Visit Number 10    Number of Visits 19    Date for PT Re-Evaluation 04/13/24    PT Start Time 1519    PT Stop Time 1557    PT Time Calculation (min) 38 min    Activity Tolerance Patient tolerated treatment well    Behavior During Therapy WFL for tasks assessed/performed                 Past Medical History:  Diagnosis Date   Aortic atherosclerosis (HCC)    Arthritis    Back pain    Basal cell carcinoma    L forehead, txted in past by another provider   Collagen vascular disease (HCC)    Depression    DOE (dyspnea on exertion)    a. 05/2020 Echo: EF 60-65%, no rwma, nl RV size/fxn.   History of stress test    a. 04/2020 Lexiscan MV: No ischemia/infarct.   Hyperlipemia    Hypertension    Melanoma (HCC) ~2015   R forearm txted in past by another provider   Recurrent UTI    Squamous cell carcinoma of skin    L med ankle, txted in past by another provider   Past Surgical History:  Procedure Laterality Date   ABDOMINAL HYSTERECTOMY     COLONOSCOPY WITH PROPOFOL N/A 05/26/2020   Procedure: COLONOSCOPY WITH PROPOFOL;  Surgeon: Toledo, Boykin Nearing, MD;  Location: [facility] END[facility]COPY;  Service: Gastroenterology;  Laterality: N/A;   INCONTINENCE SURGERY     OOPHORECTOMY     TOTAL HIP ARTHROPLASTY Left 12/11/2023   Procedure: TOTAL HIP ARTHROPLASTY ANTERIOR APPROACH;  Surgeon: Tarry Kos, MD;  Location: [facility] OR;  Service: Orthopedics;  Laterality: Left;   Patient Active Problem List   Diagnosis Date Noted   Age-related osteoporosis without current pathological fracture 01/16/2024   Closed subcapital fracture of neck of left femur, initial encounter (HCC) 12/09/2023   PSVT (paroxysmal supraventricular tachycardia) (HCC) 05/27/2023    Recurrent falls 08/16/2022   Hydrocephalus (HCC) 08/16/2022   Depression 08/16/2022   OSA (obstructive sleep apnea) 04/27/2022   Palpitations 10/03/2021   Dyspnea on exertion 04/19/2020   Aortic atherosclerosis (HCC) 04/19/2020   Essential hypertension 04/19/2020   Hyperlipidemia 04/19/2020   Right carpal tunnel syndrome 11/09/2016    PCP: Jerl Mina, MD   REFERRING PROVIDER: Tarry Kos, MD  REFERRING DIAG: [phone] (ICD-10-CM) - S/P total left hip arthroplasty  THERAPY DIAG:  Difficulty in walking, not elsewhere classified - Plan: PT plan of care cert/re-cert  Pain in left hip - Plan: PT plan of care cert/re-cert  Rationale for Evaluation and Treatment: Rehabilitation  ONSET DATE: 12/11/2023  SUBJECTIVE:   SUBJECTIVE STATEMENT: L hip is doing fine. No pain or discomfort. Went to the gym, did the bicycle, treadmill, and leg press.      PERTINENT HISTORY: S/P L THA on 12/11/2023, anterior approach secondary to a fall. [place], pt missed a step on the curb and fell onto her L side resulting in a fracture in L hip. Had home health PT which finished yesterday. Exercises included side stepping, marches, knee flexion, weight shifting, tip toes, steps, SAQ, SLR hip flexion, bridging. Has a hx of back pain  Blood pressure is controlled.  No latex allergies  PAIN:  Are you having pain? Yes: NPRS scale: 0/10 Pain location: L anterior hip at incision Pain description: "pain and numbness" Aggravating factors: not provided Relieving factors: not provided  PRECAUTIONS: Anterior hip and Fall  RED FLAGS: Bowel or bladder incontinence: No and Cauda equina syndrome: No   WEIGHT BEARING RESTRICTIONS: WBAT  FALLS:  Has patient fallen in last 6 months? Yes. Number of falls Most recent fall was December 09, 2023 resulting on L hip fracture S/P THA anterior approach.   LIVING ENVIRONMENT: Lives with: lives with their spouse; daughter lives 15 minutes  away. Lives in: House/apartment, first floor set up Stairs: Yes: Internal: 10 steps; can reach both and External: 0 steps; none Has following equipment at home: Single point cane, Walker - 2 wheeled, Tour manager, and Grab bars  OCCUPATION: Retired   PLOF: Independent  PATIENT GOALS: Be able to negotiate steps normally. Improve strength and balance  NEXT MD VISIT: around February 21, 2024  OBJECTIVE:  Note: Objective measures were completed at Evaluation unless otherwise noted.  DIAGNOSTIC FINDINGS:   PATIENT SURVEYS:  LEFS 30/80 (01/25/2024)  COGNITION: Overall cognitive status: Within functional limits for tasks assessed     SENSATION:   EDEMA:    POSTURE: forward flexed, decreased B hip extension  PALPATION:   LOWER EXTREMITY ROM:  Passive ROM Right eval Left eval  Hip flexion    Hip extension    Hip abduction    Hip adduction    Hip internal rotation    Hip external rotation    Knee flexion    Knee extension    Ankle dorsiflexion    Ankle plantarflexion    Ankle inversion    Ankle eversion     (Blank rows = not tested)  LOWER EXTREMITY MMT:  MMT Right eval Left eval  Hip flexion 4 4  Hip extension (seated manually resisted) 4+ 4+  Hip abduction (seated manually resisted) 4- 3+  Hip adduction    Hip internal rotation    Hip external rotation    Knee flexion    Knee extension 5 4+  Ankle dorsiflexion 4+ 4  Ankle plantarflexion    Ankle inversion    Ankle eversion     (Blank rows = not tested)  LOWER EXTREMITY SPECIAL TESTS:    FUNCTIONAL TESTS:  5 times sit to stand: 13.16 seconds without B UE assist Timed up and go (TUG): 11.26 seconds, 9.79 seconds, 9.28 seconds without use of AD Dynamic Gait Index: 16   GAIT: Distance walked: 30 ft Assistive device utilized: Single point cane Level of assistance: Modified independence Comments: Antalgic, decreased stance L LE, SPC on R, decreased B hip extension, forward flexed                                                                                                                                 TREATMENT DATE: 03/12/24  Therapeutic Activity:  Gait x 500 ft, no AD  Directed patient with gait with normal gait speed, with changes in speed, 180 degree pivot turn, with R and L cervical rotation position, with cervical flexion and extension position, stepping around obstacles, stepping over an obstacle, ascending and descending 4 regular steps with UE assist    Decreased eccentric L quadriceps strength with stair negotiation.   Sit <> stand without UE assist 5x  Seated manually resisted hip extension, hip abduction 1x each way for each LE   Reviewed progress/current status with PT towards goals.   Reviewed POC: continue for another 2x/week for 4 weeks to improve R quadriceps strength for stairs  Seated LAQ 3 lbs   L 10x5 seconds with eccentric lowering  Then 5 lbs 10x5 seconds for 2 sets with eccentric lowering    To promote ability to negotiate stairs with less difficulty.   Stand to sit eccentric 5x with B UE assist  Difficulty secondary to B knee pain.    SLS with B UE assist  L with slight knee flexion 10x10 seconds    Improved technique, movement at target joints, use of target muscles after mod verbal, visual, tactile cues.      PATIENT EDUCATION:  Education details: POC Person educated: Patient Education method: Explanation Education comprehension: verbalized understanding  HOME EXERCISE PROGRAM: Access Code: [email or web address] URL: [email or web address] Date: 02/01/2024 Prepared by: Loralyn Freshwater  Exercises - Standing Partial Lunge  - 1 x daily - 7 x weekly - 3 sets - 10 reps - Standing Single Leg Stance with Unilateral Counter Support  - 1 x daily - 7 x weekly - 3 sets - 10 reps - 5 seconds hold - Standing Gastroc Stretch on Step with Counter Support  - 3 x daily - 7 x weekly - 1 sets - 10 reps - 30 seconds hold  -  Seated Long Arc Quad with Ankle Weight  - 1 x daily - 7 x weekly - 3 sets - 10-15 reps - 5 seconds hold  Seated leg press seat 5  L plate 5 for 14N8   Can perform at the [employer]  - Standing Theme park manager on Step with Counter Support  - 3 x daily - 7 x weekly - 1 sets - 3 reps - 60 seconds hold   ASSESSMENT:  CLINICAL IMPRESSION:  Pt demonstrates improved ability to ambulate, improved functional LE strength, balance, and function since initial evaluation. Pt still demonstrates L LE concentric and eccentric weakness with stair negotiation which affects safety with climbing up and down stairs. Pt will benefit from continued skilled physical therapy services to improve the aforementioned deficit as well as to continue to improve strength, balance, and function.         OBJECTIVE IMPAIRMENTS: Abnormal gait, difficulty walking, decreased ROM, decreased strength, improper body mechanics, postural dysfunction, and pain.   ACTIVITY LIMITATIONS: carrying, lifting, standing, squatting, stairs, transfers, and locomotion level  PARTICIPATION LIMITATIONS:   PERSONAL FACTORS: Age, Fitness, Past/current experiences, and 3+ comorbidities: back pain, Dyspnea on exertion, HTN  are also affecting patient's functional outcome.   REHAB POTENTIAL: Fair    CLINICAL DECISION MAKING: Stable/uncomplicated  EVALUATION COMPLEXITY: Low   GOALS: Goals reviewed with patient? Yes  SHORT TERM GOALS: Target date: 02/10/2024 Pt will be independent with her initial HEP to improve strength, function, and ability to ambulate and perform standing tasks with less difficulty.  Baseline: Pt has not yet started her initial HEP (01/25/2024); no questions, able to do her HEP (  03/12/2024) Goal status: Met    LONG TERM GOALS: Target date: 04/13/2024  Pt will improve her Dynamic Gait Index (DGI) score to 19 or more as a demonstration of improved balance and decreased fall risk.   Baseline: DGI score 16 (01/25/2024); 19  (03/12/2024) Goal status: Met  2.  Pt will improve her LEFS score by at least 15 points as a demonstration of improved function.  Baseline: LEFS 30/80 (01/25/2024); 47/80 (03/12/2024) Goal status: Progressing  3.  Pt will improve B glute med and max strength by at least 1/2 MMT grade to promote ability to ambulate and perform standing tasks with less difficulty.  Baseline:  MMT Right eval Left eval R (03/12/2024) L (03/12/2024)  Hip extension (seated manually resisted) 4+ 4+ 4+ 4+  Hip abduction (seated manually resisted) 4- 3+ 4+ 4+   Goal status: partially met  4.  Pt will improve her 5 times sit <> stand time to 10 seconds or less as a demonstration of improved functional LE strength  Baseline: 5 times sit to stand: 13.16 seconds without B UE assist (01/25/2024); 11.34 seconds (03/12/2024) Goal status: Progressing  5.  Pt will be able to ambulate 500 ft or more without AD and no LOB to promote mobility.  Baseline: Pt currently ambulating with SPC (01/25/2024); independent ambulation 500 ft + (03/12/2024) Goal status: MET     PLAN:  PT FREQUENCY: 1-2x/week  PT DURATION: 4 weeks  PLANNED INTERVENTIONS: 97110-Therapeutic exercises, 97530- Therapeutic activity, 97112- Neuromuscular re-education, 97535- Self Care, 40981- Manual therapy, [id number]- Gait training, 97014- Electrical stimulation (unattended), [phone]- Ionotophoresis 4mg /ml Dexamethasone, Patient/Family education, Balance training, and Stair training  PLAN FOR NEXT SESSION: trunk and hip strengthening, gait, balance, manual techniques, modalities PRN  Thank you for your referral.  Loralyn Freshwater PT, DPT  Physical Therapist - [facility]  03/12/24, 4:16 PM

## 2024-03-13 ENCOUNTER — Other Ambulatory Visit: Payer: Self-pay | Admitting: Neurology

## 2024-03-13 DIAGNOSIS — E785 Hyperlipidemia, unspecified: Secondary | ICD-10-CM | POA: Diagnosis not present

## 2024-03-13 DIAGNOSIS — I10 Essential (primary) hypertension: Secondary | ICD-10-CM | POA: Diagnosis not present

## 2024-03-13 DIAGNOSIS — Z8679 Personal history of other diseases of the circulatory system: Secondary | ICD-10-CM

## 2024-03-14 ENCOUNTER — Ambulatory Visit: Payer: PPO

## 2024-03-14 DIAGNOSIS — M25552 Pain in left hip: Secondary | ICD-10-CM

## 2024-03-14 DIAGNOSIS — R262 Difficulty in walking, not elsewhere classified: Secondary | ICD-10-CM | POA: Diagnosis not present

## 2024-03-14 NOTE — Therapy (Signed)
 OUTPATIENT PHYSICAL THERAPY TREATMENT    Patient Name: Michelle Cisneros MRN: [id number] DOB:07-Dec-1956, 68 y.o., female Today's Date: 03/14/2024  END OF SESSION:  PT End of Session - 03/14/24 1302     Visit Number 11    Number of Visits 19    Date for PT Re-Evaluation 04/13/24    PT Start Time 1302    PT Stop Time 1342    PT Time Calculation (min) 40 min    Activity Tolerance Patient tolerated treatment well    Behavior During Therapy [facility] for tasks assessed/performed                  Past Medical History:  Diagnosis Date   Aortic atherosclerosis (HCC)    Arthritis    Back pain    Basal cell carcinoma    L forehead, txted in past by another provider   Collagen vascular disease (HCC)    Depression    DOE (dyspnea on exertion)    a. 05/2020 Echo: EF 60-65%, no rwma, nl RV size/fxn.   History of stress test    a. 04/2020 Lexiscan MV: No ischemia/infarct.   Hyperlipemia    Hypertension    Melanoma (HCC) ~2015   R forearm txted in past by another provider   Recurrent UTI    Squamous cell carcinoma of skin    L med ankle, txted in past by another provider   Past Surgical History:  Procedure Laterality Date   ABDOMINAL HYSTERECTOMY     COLONOSCOPY WITH PROPOFOL N/A 05/26/2020   Procedure: COLONOSCOPY WITH PROPOFOL;  Surgeon: Toledo, Boykin Nearing, MD;  Location: [facility] ENDOSCOPY;  Service: Gastroenterology;  Laterality: N/A;   INCONTINENCE SURGERY     OOPHORECTOMY     TOTAL HIP ARTHROPLASTY Left 12/11/2023   Procedure: TOTAL HIP ARTHROPLASTY ANTERIOR APPROACH;  Surgeon: Tarry Kos, MD;  Location: [facility] OR;  Service: Orthopedics;  Laterality: Left;   Patient Active Problem List   Diagnosis Date Noted   Age-related osteoporosis without current pathological fracture 01/16/2024   Closed subcapital fracture of neck of left femur, initial encounter (HCC) 12/09/2023   PSVT (paroxysmal supraventricular tachycardia) (HCC) 05/27/2023   Recurrent falls 08/16/2022    Hydrocephalus (HCC) 08/16/2022   Depression 08/16/2022   OSA (obstructive sleep apnea) 04/27/2022   Palpitations 10/03/2021   Dyspnea on exertion 04/19/2020   Aortic atherosclerosis (HCC) 04/19/2020   Essential hypertension 04/19/2020   Hyperlipidemia 04/19/2020   Right carpal tunnel syndrome 11/09/2016    PCP: Jerl Mina, MD   REFERRING PROVIDER: Tarry Kos, MD  REFERRING DIAG: [phone] (ICD-10-CM) - S/P total left hip arthroplasty  THERAPY DIAG:  Difficulty in walking, not elsewhere classified  Pain in left hip  Rationale for Evaluation and Treatment: Rehabilitation  ONSET DATE: 12/11/2023  SUBJECTIVE:   SUBJECTIVE STATEMENT: L hip is doing fine. No pain or discomfort.    PERTINENT HISTORY: S/P L THA on 12/11/2023, anterior approach secondary to a fall. [place], pt missed a step on the curb and fell onto her L side resulting in a fracture in L hip. Had home health PT which finished yesterday. Exercises included side stepping, marches, knee flexion, weight shifting, tip toes, steps, SAQ, SLR hip flexion, bridging. Has a hx of back pain  Blood pressure is controlled.  No latex allergies  PAIN:  Are you having pain? Yes: NPRS scale: 0/10 Pain location: L anterior hip at incision Pain description: "pain and numbness" Aggravating factors: not provided Relieving  factors: not provided  PRECAUTIONS: Anterior hip and Fall  RED FLAGS: Bowel or bladder incontinence: No and Cauda equina syndrome: No   WEIGHT BEARING RESTRICTIONS: WBAT  FALLS:  Has patient fallen in last 6 months? Yes. Number of falls Most recent fall was December 09, 2023 resulting on L hip fracture S/P THA anterior approach.   LIVING ENVIRONMENT: Lives with: lives with their spouse; daughter lives 15 minutes away. Lives in: House/apartment, first floor set up Stairs: Yes: Internal: 10 steps; can reach both and External: 0 steps; none Has following equipment at home:  Single point cane, Walker - 2 wheeled, Tour manager, and Grab bars  OCCUPATION: Retired   PLOF: Independent  PATIENT GOALS: Be able to negotiate steps normally. Improve strength and balance  NEXT MD VISIT: around February 21, 2024  OBJECTIVE:  Note: Objective measures were completed at Evaluation unless otherwise noted.  DIAGNOSTIC FINDINGS:   PATIENT SURVEYS:  LEFS 30/80 (01/25/2024)  COGNITION: Overall cognitive status: Within functional limits for tasks assessed     SENSATION:   EDEMA:    POSTURE: forward flexed, decreased B hip extension  PALPATION:   LOWER EXTREMITY ROM:  Passive ROM Right eval Left eval  Hip flexion    Hip extension    Hip abduction    Hip adduction    Hip internal rotation    Hip external rotation    Knee flexion    Knee extension    Ankle dorsiflexion    Ankle plantarflexion    Ankle inversion    Ankle eversion     (Blank rows = not tested)  LOWER EXTREMITY MMT:  MMT Right eval Left eval  Hip flexion 4 4  Hip extension (seated manually resisted) 4+ 4+  Hip abduction (seated manually resisted) 4- 3+  Hip adduction    Hip internal rotation    Hip external rotation    Knee flexion    Knee extension 5 4+  Ankle dorsiflexion 4+ 4  Ankle plantarflexion    Ankle inversion    Ankle eversion     (Blank rows = not tested)  LOWER EXTREMITY SPECIAL TESTS:    FUNCTIONAL TESTS:  5 times sit to stand: 13.16 seconds without B UE assist Timed up and go (TUG): 11.26 seconds, 9.79 seconds, 9.28 seconds without use of AD Dynamic Gait Index: 16   GAIT: Distance walked: 30 ft Assistive device utilized: Single point cane Level of assistance: Modified independence Comments: Antalgic, decreased stance L LE, SPC on R, decreased B hip extension, forward flexed                                                                                                                                TREATMENT DATE: 03/14/24  Therapeutic Activity:                    Seated (on chair with Air Ex pad LAQ 5 lbs 10x5 seconds with eccentric  lowering  Then 7.5 lbs 10x5 seconds with eccentric lowering  Then 12.5 lbs 10x5 seconds, then 5x5 seconds with eccentric lowering   To promote ability to negotiate stairs with less difficulty.   Single leg dead lift with contralateral UE and toe assist   L 10x3  Standing with B UE assist   Yellow band around thighs  Hip abduction    L 10x   R 10x   Yellow band around ankles   L 10x3   R 10x3  Forward step up onto 4 inch step with contralateral UE assist   L 10x2  Lateral step down from 4 inch step with B UE assist    L 4x. Difficulty bending her L knee   Total gym   L single leg squat height 11 for 10x   Height 14 for 10x, then 3x   Height 12 for 10x    SLS with B UE assist  L with slight knee flexion 10x10 seconds    Improved technique, movement at target joints, use of target muscles after mod verbal, visual, tactile cues.      PATIENT EDUCATION:  Education details: POC Person educated: Patient Education method: Explanation Education comprehension: verbalized understanding  HOME EXERCISE PROGRAM: Access Code: [email or web address] URL: [email or web address] Date: 02/01/2024 Prepared by: Loralyn Freshwater  Exercises - Standing Partial Lunge  - 1 x daily - 7 x weekly - 3 sets - 10 reps - Standing Single Leg Stance with Unilateral Counter Support  - 1 x daily - 7 x weekly - 3 sets - 10 reps - 5 seconds hold - Standing Gastroc Stretch on Step with Counter Support  - 3 x daily - 7 x weekly - 1 sets - 10 reps - 30 seconds hold  - Seated Long Arc Quad with Ankle Weight  - 1 x daily - 7 x weekly - 3 sets - 10-15 reps - 5 seconds hold  Seated leg press seat 5  L plate 5 for 46N6   Can perform at the [employer]  - Standing Theme park manager on Step with Counter Support  - 3 x daily - 7 x weekly - 1 sets - 3 reps - 60 seconds hold   ASSESSMENT:  CLINICAL IMPRESSION:  Continued  working on improving L quadriceps concentric and eccentric strength for stair negotiation. Utilized [employer] gym today which helped pt perform L single leg squat movements without L knee pain to promote strength for to improve ability to perform the aforementioned task. Pt tolerated session well without aggravation of symptoms. Pt will benefit from continued skilled physical therapy services to improve the aforementioned deficit as well as to continue to improve strength, balance, and function.         OBJECTIVE IMPAIRMENTS: Abnormal gait, difficulty walking, decreased ROM, decreased strength, improper body mechanics, postural dysfunction, and pain.   ACTIVITY LIMITATIONS: carrying, lifting, standing, squatting, stairs, transfers, and locomotion level  PARTICIPATION LIMITATIONS:   PERSONAL FACTORS: Age, Fitness, Past/current experiences, and 3+ comorbidities: back pain, Dyspnea on exertion, HTN  are also affecting patient's functional outcome.   REHAB POTENTIAL: Fair    CLINICAL DECISION MAKING: Stable/uncomplicated  EVALUATION COMPLEXITY: Low   GOALS: Goals reviewed with patient? Yes  SHORT TERM GOALS: Target date: 02/10/2024 Pt will be independent with her initial HEP to improve strength, function, and ability to ambulate and perform standing tasks with less difficulty.  Baseline: Pt has not yet started her initial HEP (01/25/2024); no questions, able to do her HEP (03/12/2024) Goal  status: Met    LONG TERM GOALS: Target date: 04/13/2024  Pt will improve her Dynamic Gait Index (DGI) score to 19 or more as a demonstration of improved balance and decreased fall risk.   Baseline: DGI score 16 (01/25/2024); 19 (03/12/2024) Goal status: Met  2.  Pt will improve her LEFS score by at least 15 points as a demonstration of improved function.  Baseline: LEFS 30/80 (01/25/2024); 47/80 (03/12/2024) Goal status: MET  3.  Pt will improve B glute med and max strength by at least 1/2 MMT grade to  promote ability to ambulate and perform standing tasks with less difficulty.  Baseline:  MMT Right eval Left eval R (03/12/2024) L (03/12/2024)  Hip extension (seated manually resisted) 4+ 4+ 4+ 4+  Hip abduction (seated manually resisted) 4- 3+ 4+ 4+   Goal status: partially met  4.  Pt will improve her 5 times sit <> stand time to 10 seconds or less as a demonstration of improved functional LE strength  Baseline: 5 times sit to stand: 13.16 seconds without B UE assist (01/25/2024); 11.34 seconds (03/12/2024) Goal status: Progressing  5.  Pt will be able to ambulate 500 ft or more without AD and no LOB to promote mobility.  Baseline: Pt currently ambulating with SPC (01/25/2024); independent ambulation 500 ft + (03/12/2024) Goal status: MET     PLAN:  PT FREQUENCY: 1-2x/week  PT DURATION: 4 weeks  PLANNED INTERVENTIONS: 97110-Therapeutic exercises, 97530- Therapeutic activity, 97112- Neuromuscular re-education, 97535- Self Care, 16109- Manual therapy, 97116- Gait training, 97014- Electrical stimulation (unattended), [phone]- Ionotophoresis 4mg /ml Dexamethasone, Patient/Family education, Balance training, and Stair training  PLAN FOR NEXT SESSION: trunk and hip strengthening, gait, balance, manual techniques, modalities PRN   Loralyn Freshwater PT, DPT  Physical Therapist - [facility]  [facility]  03/14/24, 1:51 PM

## 2024-03-15 ENCOUNTER — Ambulatory Visit
Admission: RE | Admit: 2024-03-15 | Discharge: 2024-03-15 | Disposition: A | Discharge status: Home / Self Care | Source: Ambulatory Visit | Attending: Neurology | Admitting: Neurology

## 2024-03-15 DIAGNOSIS — I671 Cerebral aneurysm, nonruptured: Secondary | ICD-10-CM | POA: Insufficient documentation

## 2024-03-15 DIAGNOSIS — Z8679 Personal history of other diseases of the circulatory system: Secondary | ICD-10-CM | POA: Insufficient documentation

## 2024-03-19 ENCOUNTER — Ambulatory Visit: Payer: PPO

## 2024-03-19 ENCOUNTER — Ambulatory Visit: Payer: Self-pay | Admitting: Urology

## 2024-03-19 DIAGNOSIS — R262 Difficulty in walking, not elsewhere classified: Secondary | ICD-10-CM | POA: Diagnosis not present

## 2024-03-19 DIAGNOSIS — M25552 Pain in left hip: Secondary | ICD-10-CM

## 2024-03-19 DIAGNOSIS — R269 Unspecified abnormalities of gait and mobility: Secondary | ICD-10-CM

## 2024-03-19 NOTE — Therapy (Signed)
 OUTPATIENT PHYSICAL THERAPY TREATMENT    Patient Name: Michelle Cisneros MRN: [id number] DOB:Jul 16, 1956, 68 y.o., female Today's Date: 03/19/2024  END OF SESSION:  PT End of Session - 03/19/24 1303     Visit Number 12    Number of Visits 19    Date for PT Re-Evaluation 04/13/24    PT Start Time 1303    PT Stop Time 1343    PT Time Calculation (min) 40 min    Activity Tolerance Patient tolerated treatment well    Behavior During Therapy [facility] for tasks assessed/performed                   Past Medical History:  Diagnosis Date   Aortic atherosclerosis (HCC)    Arthritis    Back pain    Basal cell carcinoma    L forehead, txted in past by another provider   Collagen vascular disease (HCC)    Depression    DOE (dyspnea on exertion)    a. 05/2020 Echo: EF 60-65%, no rwma, nl RV size/fxn.   History of stress test    a. 04/2020 Lexiscan MV: No ischemia/infarct.   Hyperlipemia    Hypertension    Melanoma (HCC) ~2015   R forearm txted in past by another provider   Recurrent UTI    Squamous cell carcinoma of skin    L med ankle, txted in past by another provider   Past Surgical History:  Procedure Laterality Date   ABDOMINAL HYSTERECTOMY     COLONOSCOPY WITH PROPOFOL N/A 05/26/2020   Procedure: COLONOSCOPY WITH PROPOFOL;  Surgeon: Toledo, Boykin Nearing, MD;  Location: [facility] ENDOSCOPY;  Service: Gastroenterology;  Laterality: N/A;   INCONTINENCE SURGERY     OOPHORECTOMY     TOTAL HIP ARTHROPLASTY Left 12/11/2023   Procedure: TOTAL HIP ARTHROPLASTY ANTERIOR APPROACH;  Surgeon: Tarry Kos, MD;  Location: [facility] OR;  Service: Orthopedics;  Laterality: Left;   Patient Active Problem List   Diagnosis Date Noted   Age-related osteoporosis without current pathological fracture 01/16/2024   Closed subcapital fracture of neck of left femur, initial encounter (HCC) 12/09/2023   PSVT (paroxysmal supraventricular tachycardia) (HCC) 05/27/2023   Recurrent falls 08/16/2022    Hydrocephalus (HCC) 08/16/2022   Depression 08/16/2022   OSA (obstructive sleep apnea) 04/27/2022   Palpitations 10/03/2021   Dyspnea on exertion 04/19/2020   Aortic atherosclerosis (HCC) 04/19/2020   Essential hypertension 04/19/2020   Hyperlipidemia 04/19/2020   Right carpal tunnel syndrome 11/09/2016    PCP: Jerl Mina, MD   REFERRING PROVIDER: Tarry Kos, MD  REFERRING DIAG: [phone] (ICD-10-CM) - S/P total left hip arthroplasty  THERAPY DIAG:  Difficulty in walking, not elsewhere classified  Pain in left hip  Abnormality of gait and mobility  Rationale for Evaluation and Treatment: Rehabilitation  ONSET DATE: 12/11/2023  SUBJECTIVE:   SUBJECTIVE STATEMENT: L hip is good. No pain or discomfort.    PERTINENT HISTORY: S/P L THA on 12/11/2023, anterior approach secondary to a fall. [place], pt missed a step on the curb and fell onto her L side resulting in a fracture in L hip. Had home health PT which finished yesterday. Exercises included side stepping, marches, knee flexion, weight shifting, tip toes, steps, SAQ, SLR hip flexion, bridging. Has a hx of back pain  Blood pressure is controlled.  No latex allergies  PAIN:  Are you having pain? Yes: NPRS scale: 0/10 Pain location: L anterior hip at incision Pain description: "pain and  numbness" Aggravating factors: not provided Relieving factors: not provided  PRECAUTIONS: Anterior hip and Fall  RED FLAGS: Bowel or bladder incontinence: No and Cauda equina syndrome: No   WEIGHT BEARING RESTRICTIONS: WBAT  FALLS:  Has patient fallen in last 6 months? Yes. Number of falls Most recent fall was December 09, 2023 resulting on L hip fracture S/P THA anterior approach.   LIVING ENVIRONMENT: Lives with: lives with their spouse; daughter lives 15 minutes away. Lives in: House/apartment, first floor set up Stairs: Yes: Internal: 10 steps; can reach both and External: 0 steps; none Has  following equipment at home: Single point cane, Walker - 2 wheeled, Tour manager, and Grab bars  OCCUPATION: Retired   PLOF: Independent  PATIENT GOALS: Be able to negotiate steps normally. Improve strength and balance  NEXT MD VISIT: around February 21, 2024  OBJECTIVE:  Note: Objective measures were completed at Evaluation unless otherwise noted.  DIAGNOSTIC FINDINGS:   PATIENT SURVEYS:  LEFS 30/80 (01/25/2024)  COGNITION: Overall cognitive status: Within functional limits for tasks assessed     SENSATION:   EDEMA:    POSTURE: forward flexed, decreased B hip extension  PALPATION:   LOWER EXTREMITY ROM:  Passive ROM Right eval Left eval  Hip flexion    Hip extension    Hip abduction    Hip adduction    Hip internal rotation    Hip external rotation    Knee flexion    Knee extension    Ankle dorsiflexion    Ankle plantarflexion    Ankle inversion    Ankle eversion     (Blank rows = not tested)  LOWER EXTREMITY MMT:  MMT Right eval Left eval  Hip flexion 4 4  Hip extension (seated manually resisted) 4+ 4+  Hip abduction (seated manually resisted) 4- 3+  Hip adduction    Hip internal rotation    Hip external rotation    Knee flexion    Knee extension 5 4+  Ankle dorsiflexion 4+ 4  Ankle plantarflexion    Ankle inversion    Ankle eversion     (Blank rows = not tested)  LOWER EXTREMITY SPECIAL TESTS:    FUNCTIONAL TESTS:  5 times sit to stand: 13.16 seconds without B UE assist Timed up and go (TUG): 11.26 seconds, 9.79 seconds, 9.28 seconds without use of AD Dynamic Gait Index: 16   GAIT: Distance walked: 30 ft Assistive device utilized: Single point cane Level of assistance: Modified independence Comments: Antalgic, decreased stance L LE, SPC on R, decreased B hip extension, forward flexed                                                                                                                                TREATMENT DATE:  03/19/24  Therapeutic Activity:                 Seated (on Low mat table)  13 lbs 10x, then  10x5 seconds for 2 sets with eccentric lowering   To promote ability to negotiate stairs with less difficulty.   Single leg dead lift with contralateral UE and toe assist   L 10x  Standing mini static lunge with contralateral UE assist   L 10x3  Standing with B UE assist   Hip abduction   Yellow band around ankles   L 10x3   R 10x3   SLS with B UE assist  L with slight knee flexion 10x10 seconds   Total gym   L single leg squat height 11 for 10x2     Then Height 12 for 10x3  Forward step up onto 4 inch step with contralateral UE assist to no UE assist  L 10x3  Then forward step up onto and over 4 inch step with contralateral UE assist  L 10x3  Lateral step up onto 4 inch step with B UE assist   L 10x3   Sled push (no weight) forward 30 ft x 4   Improved technique, movement at target joints, use of target muscles after mod verbal, visual, tactile cues.      PATIENT EDUCATION:  Education details: POC Person educated: Patient Education method: Explanation Education comprehension: verbalized understanding  HOME EXERCISE PROGRAM: Access Code: [email or web address] URL: [email or web address] Date: 02/01/2024 Prepared by: Loralyn Freshwater  Exercises - Standing Partial Lunge  - 1 x daily - 7 x weekly - 3 sets - 10 reps - Standing Single Leg Stance with Unilateral Counter Support  - 1 x daily - 7 x weekly - 3 sets - 10 reps - 5 seconds hold - Standing Gastroc Stretch on Step with Counter Support  - 3 x daily - 7 x weekly - 1 sets - 10 reps - 30 seconds hold  - Seated Long Arc Quad with Ankle Weight  - 1 x daily - 7 x weekly - 3 sets - 10-15 reps - 5 seconds hold  Seated leg press seat 5  L plate 5 for [id number]   Can perform at the [employer]  - Standing Theme park manager on Step with Counter Support  - 3 x daily - 7 x weekly - 1 sets - 3 reps - 60 seconds  hold   ASSESSMENT:  CLINICAL IMPRESSION:  Continued working on improving L quadriceps concentric and eccentric strength for stair negotiation. Continued utilizing the Total gym today which helped pt perform L single leg squat movements without L knee pain to promote strength for to improve ability to perform the aforementioned task. Pt tolerated session well without aggravation of symptoms. Pt will benefit from continued skilled physical therapy services to improve the aforementioned deficit as well as to continue to improve strength, balance, and function.         OBJECTIVE IMPAIRMENTS: Abnormal gait, difficulty walking, decreased ROM, decreased strength, improper body mechanics, postural dysfunction, and pain.   ACTIVITY LIMITATIONS: carrying, lifting, standing, squatting, stairs, transfers, and locomotion level  PARTICIPATION LIMITATIONS:   PERSONAL FACTORS: Age, Fitness, Past/current experiences, and 3+ comorbidities: back pain, Dyspnea on exertion, HTN  are also affecting patient's functional outcome.   REHAB POTENTIAL: Fair    CLINICAL DECISION MAKING: Stable/uncomplicated  EVALUATION COMPLEXITY: Low   GOALS: Goals reviewed with patient? Yes  SHORT TERM GOALS: Target date: 02/10/2024 Pt will be independent with her initial HEP to improve strength, function, and ability to ambulate and perform standing tasks with less difficulty.  Baseline: Pt has not yet started her initial HEP (01/25/2024); no questions, able  to do her HEP (03/12/2024) Goal status: Met    LONG TERM GOALS: Target date: 04/13/2024  Pt will improve her Dynamic Gait Index (DGI) score to 19 or more as a demonstration of improved balance and decreased fall risk.   Baseline: DGI score 16 (01/25/2024); 19 (03/12/2024) Goal status: Met  2.  Pt will improve her LEFS score by at least 15 points as a demonstration of improved function.  Baseline: LEFS 30/80 (01/25/2024); 47/80 (03/12/2024) Goal status: MET  3.  Pt  will improve B glute med and max strength by at least 1/2 MMT grade to promote ability to ambulate and perform standing tasks with less difficulty.  Baseline:  MMT Right eval Left eval R (03/12/2024) L (03/12/2024)  Hip extension (seated manually resisted) 4+ 4+ 4+ 4+  Hip abduction (seated manually resisted) 4- 3+ 4+ 4+   Goal status: partially met  4.  Pt will improve her 5 times sit <> stand time to 10 seconds or less as a demonstration of improved functional LE strength  Baseline: 5 times sit to stand: 13.16 seconds without B UE assist (01/25/2024); 11.34 seconds (03/12/2024) Goal status: Progressing  5.  Pt will be able to ambulate 500 ft or more without AD and no LOB to promote mobility.  Baseline: Pt currently ambulating with SPC (01/25/2024); independent ambulation 500 ft + (03/12/2024) Goal status: MET     PLAN:  PT FREQUENCY: 1-2x/week  PT DURATION: 4 weeks  PLANNED INTERVENTIONS: 97110-Therapeutic exercises, 97530- Therapeutic activity, 97112- Neuromuscular re-education, 97535- Self Care, 56433- Manual therapy, 97116- Gait training, 97014- Electrical stimulation (unattended), [phone]- Ionotophoresis 4mg /ml Dexamethasone, Patient/Family education, Balance training, and Stair training  PLAN FOR NEXT SESSION: trunk and hip strengthening, gait, balance, manual techniques, modalities PRN   Loralyn Freshwater PT, DPT  Physical Therapist - [facility]  [facility]  03/19/24, 1:48 PM

## 2024-03-20 DIAGNOSIS — R0982 Postnasal drip: Secondary | ICD-10-CM | POA: Diagnosis not present

## 2024-03-20 DIAGNOSIS — I10 Essential (primary) hypertension: Secondary | ICD-10-CM | POA: Diagnosis not present

## 2024-03-20 DIAGNOSIS — Z0000 Encounter for general adult medical examination without abnormal findings: Secondary | ICD-10-CM | POA: Diagnosis not present

## 2024-03-20 DIAGNOSIS — R2689 Other abnormalities of gait and mobility: Secondary | ICD-10-CM | POA: Diagnosis not present

## 2024-03-20 DIAGNOSIS — E785 Hyperlipidemia, unspecified: Secondary | ICD-10-CM | POA: Diagnosis not present

## 2024-03-21 ENCOUNTER — Ambulatory Visit: Payer: PPO | Attending: Podiatry

## 2024-03-21 DIAGNOSIS — M25552 Pain in left hip: Secondary | ICD-10-CM | POA: Diagnosis not present

## 2024-03-21 DIAGNOSIS — R262 Difficulty in walking, not elsewhere classified: Secondary | ICD-10-CM | POA: Diagnosis not present

## 2024-03-21 DIAGNOSIS — R269 Unspecified abnormalities of gait and mobility: Secondary | ICD-10-CM | POA: Insufficient documentation

## 2024-03-21 NOTE — Therapy (Signed)
 OUTPATIENT PHYSICAL THERAPY TREATMENT    Patient Name: Michelle Cisneros MRN: [id number] DOB:12/20/1956, 68 y.o., female Today's Date: 03/21/2024  END OF SESSION:  PT End of Session - 03/21/24 1302     Visit Number 13    Number of Visits 19    Date for PT Re-Evaluation 04/13/24    PT Start Time 1302    PT Stop Time 1340    PT Time Calculation (min) 38 min    Activity Tolerance Patient tolerated treatment well    Behavior During Therapy WFL for tasks assessed/performed                    Past Medical History:  Diagnosis Date   Aortic atherosclerosis (HCC)    Arthritis    Back pain    Basal cell carcinoma    L forehead, txted in past by another provider   Collagen vascular disease (HCC)    Depression    DOE (dyspnea on exertion)    a. 05/2020 Echo: EF 60-65%, no rwma, nl RV size/fxn.   History of stress test    a. 04/2020 Lexiscan MV: No ischemia/infarct.   Hyperlipemia    Hypertension    Melanoma (HCC) ~2015   R forearm txted in past by another provider   Recurrent UTI    Squamous cell carcinoma of skin    L med ankle, txted in past by another provider   Past Surgical History:  Procedure Laterality Date   ABDOMINAL HYSTERECTOMY     COLONOSCOPY WITH PROPOFOL N/A 05/26/2020   Procedure: COLONOSCOPY WITH PROPOFOL;  Surgeon: Toledo, Boykin Nearing, MD;  Location: [facility] ENDOSCOPY;  Service: Gastroenterology;  Laterality: N/A;   INCONTINENCE SURGERY     OOPHORECTOMY     TOTAL HIP ARTHROPLASTY Left 12/11/2023   Procedure: TOTAL HIP ARTHROPLASTY ANTERIOR APPROACH;  Surgeon: Tarry Kos, MD;  Location: [facility] OR;  Service: Orthopedics;  Laterality: Left;   Patient Active Problem List   Diagnosis Date Noted   Age-related osteoporosis without current pathological fracture 01/16/2024   Closed subcapital fracture of neck of left femur, initial encounter (HCC) 12/09/2023   PSVT (paroxysmal supraventricular tachycardia) (HCC) 05/27/2023   Recurrent falls 08/16/2022    Hydrocephalus (HCC) 08/16/2022   Depression 08/16/2022   OSA (obstructive sleep apnea) 04/27/2022   Palpitations 10/03/2021   Dyspnea on exertion 04/19/2020   Aortic atherosclerosis (HCC) 04/19/2020   Essential hypertension 04/19/2020   Hyperlipidemia 04/19/2020   Right carpal tunnel syndrome 11/09/2016    PCP: Jerl Mina, MD   REFERRING PROVIDER: Tarry Kos, MD  REFERRING DIAG: [phone] (ICD-10-CM) - S/P total left hip arthroplasty  THERAPY DIAG:  Difficulty in walking, not elsewhere classified  Pain in left hip  Rationale for Evaluation and Treatment: Rehabilitation  ONSET DATE: 12/11/2023  SUBJECTIVE:   SUBJECTIVE STATEMENT: L hip is good. No pain or discomfort.    PERTINENT HISTORY: S/P L THA on 12/11/2023, anterior approach secondary to a fall. [place], pt missed a step on the curb and fell onto her L side resulting in a fracture in L hip. Had home health PT which finished yesterday. Exercises included side stepping, marches, knee flexion, weight shifting, tip toes, steps, SAQ, SLR hip flexion, bridging. Has a hx of back pain  Blood pressure is controlled.  No latex allergies  PAIN:  Are you having pain? Yes: NPRS scale: 0/10 Pain location: L anterior hip at incision Pain description: "pain and numbness" Aggravating factors: not provided  Relieving factors: not provided  PRECAUTIONS: Anterior hip and Fall  RED FLAGS: Bowel or bladder incontinence: No and Cauda equina syndrome: No   WEIGHT BEARING RESTRICTIONS: WBAT  FALLS:  Has patient fallen in last 6 months? Yes. Number of falls Most recent fall was December 09, 2023 resulting on L hip fracture S/P THA anterior approach.   LIVING ENVIRONMENT: Lives with: lives with their spouse; daughter lives 15 minutes away. Lives in: House/apartment, first floor set up Stairs: Yes: Internal: 10 steps; can reach both and External: 0 steps; none Has following equipment at home: Single  point cane, Walker - 2 wheeled, Tour manager, and Grab bars  OCCUPATION: Retired   PLOF: Independent  PATIENT GOALS: Be able to negotiate steps normally. Improve strength and balance  NEXT MD VISIT: around February 21, 2024  OBJECTIVE:  Note: Objective measures were completed at Evaluation unless otherwise noted.  DIAGNOSTIC FINDINGS:   PATIENT SURVEYS:  LEFS 30/80 (01/25/2024)  COGNITION: Overall cognitive status: Within functional limits for tasks assessed     SENSATION:   EDEMA:    POSTURE: forward flexed, decreased B hip extension  PALPATION:   LOWER EXTREMITY ROM:  Passive ROM Right eval Left eval  Hip flexion    Hip extension    Hip abduction    Hip adduction    Hip internal rotation    Hip external rotation    Knee flexion    Knee extension    Ankle dorsiflexion    Ankle plantarflexion    Ankle inversion    Ankle eversion     (Blank rows = not tested)  LOWER EXTREMITY MMT:  MMT Right eval Left eval  Hip flexion 4 4  Hip extension (seated manually resisted) 4+ 4+  Hip abduction (seated manually resisted) 4- 3+  Hip adduction    Hip internal rotation    Hip external rotation    Knee flexion    Knee extension 5 4+  Ankle dorsiflexion 4+ 4  Ankle plantarflexion    Ankle inversion    Ankle eversion     (Blank rows = not tested)  LOWER EXTREMITY SPECIAL TESTS:    FUNCTIONAL TESTS:  5 times sit to stand: 13.16 seconds without B UE assist Timed up and go (TUG): 11.26 seconds, 9.79 seconds, 9.28 seconds without use of AD Dynamic Gait Index: 16   GAIT: Distance walked: 30 ft Assistive device utilized: Single point cane Level of assistance: Modified independence Comments: Antalgic, decreased stance L LE, SPC on R, decreased B hip extension, forward flexed                                                                                                                                TREATMENT DATE: 03/21/24  Therapeutic Activity:                  Seated (on Low mat table)  13 lbs 10x5 seconds for 2 sets, then 5x5  seconds with eccentric lowering   To promote ability to negotiate stairs with less difficulty.   Standing with B UE assist   Hip abduction   Yellow band around ankles   L 10x3   R 10x3  Standing mini static lunge with contralateral UE assist   L 10x3  Total gym   L single leg squat height 11 for 10x2 with eccentric lowering     Then Height 12 for 10x2 with eccentric lowering  Single leg dead lift with contralateral UE and toe assist   L 10x3  Forward step up onto 4 inch step with contralateral UE assist to no UE assist  L 10x3  Then forward step up onto and over 4 inch step with contralateral UE assist  L 10x      Improved technique, movement at target joints, use of target muscles after mod verbal, visual, tactile cues.      PATIENT EDUCATION:  Education details: POC Person educated: Patient Education method: Explanation Education comprehension: verbalized understanding  HOME EXERCISE PROGRAM: Access Code: [email or web address] URL: [email or web address] Date: 02/01/2024 Prepared by: Loralyn Freshwater  Exercises - Standing Partial Lunge  - 1 x daily - 7 x weekly - 3 sets - 10 reps - Standing Single Leg Stance with Unilateral Counter Support  - 1 x daily - 7 x weekly - 3 sets - 10 reps - 5 seconds hold - Standing Gastroc Stretch on Step with Counter Support  - 3 x daily - 7 x weekly - 1 sets - 10 reps - 30 seconds hold  - Seated Long Arc Quad with Ankle Weight  - 1 x daily - 7 x weekly - 3 sets - 10-15 reps - 5 seconds hold  Seated leg press seat 5  L plate 5 for 10U7   Can perform at the [employer]  - Standing Theme park manager on Step with Counter Support  - 3 x daily - 7 x weekly - 1 sets - 3 reps - 60 seconds hold   ASSESSMENT:  CLINICAL IMPRESSION:  Continued working on improving L quadriceps concentric and eccentric strength for stair negotiation. Continued utilizing the [employer] gym  to help pt perform L single leg squat movements without L knee pain to promote strength for to improve ability to perform the aforementioned task. Pt tolerated session well without aggravation of symptoms. Pt will benefit from continued skilled physical therapy services to improve the aforementioned deficit as well as to continue to improve strength, balance, and function.         OBJECTIVE IMPAIRMENTS: Abnormal gait, difficulty walking, decreased ROM, decreased strength, improper body mechanics, postural dysfunction, and pain.   ACTIVITY LIMITATIONS: carrying, lifting, standing, squatting, stairs, transfers, and locomotion level  PARTICIPATION LIMITATIONS:   PERSONAL FACTORS: Age, Fitness, Past/current experiences, and 3+ comorbidities: back pain, Dyspnea on exertion, HTN  are also affecting patient's functional outcome.   REHAB POTENTIAL: Fair    CLINICAL DECISION MAKING: Stable/uncomplicated  EVALUATION COMPLEXITY: Low   GOALS: Goals reviewed with patient? Yes  SHORT TERM GOALS: Target date: 02/10/2024 Pt will be independent with her initial HEP to improve strength, function, and ability to ambulate and perform standing tasks with less difficulty.  Baseline: Pt has not yet started her initial HEP (01/25/2024); no questions, able to do her HEP (03/12/2024) Goal status: Met    LONG TERM GOALS: Target date: 04/13/2024  Pt will improve her Dynamic Gait Index (DGI) score to 19 or more as a demonstration of improved balance and decreased  fall risk.   Baseline: DGI score 16 (01/25/2024); 19 (03/12/2024) Goal status: Met  2.  Pt will improve her LEFS score by at least 15 points as a demonstration of improved function.  Baseline: LEFS 30/80 (01/25/2024); 47/80 (03/12/2024) Goal status: MET  3.  Pt will improve B glute med and max strength by at least 1/2 MMT grade to promote ability to ambulate and perform standing tasks with less difficulty.  Baseline:  MMT Right eval Left eval  R (03/12/2024) L (03/12/2024)  Hip extension (seated manually resisted) 4+ 4+ 4+ 4+  Hip abduction (seated manually resisted) 4- 3+ 4+ 4+   Goal status: partially met  4.  Pt will improve her 5 times sit <> stand time to 10 seconds or less as a demonstration of improved functional LE strength  Baseline: 5 times sit to stand: 13.16 seconds without B UE assist (01/25/2024); 11.34 seconds (03/12/2024) Goal status: Progressing  5.  Pt will be able to ambulate 500 ft or more without AD and no LOB to promote mobility.  Baseline: Pt currently ambulating with SPC (01/25/2024); independent ambulation 500 ft + (03/12/2024) Goal status: MET     PLAN:  PT FREQUENCY: 1-2x/week  PT DURATION: 4 weeks  PLANNED INTERVENTIONS: 97110-Therapeutic exercises, 97530- Therapeutic activity, 97112- Neuromuscular re-education, 97535- Self Care, 40981- Manual therapy, 97116- Gait training, 97014- Electrical stimulation (unattended), [phone]- Ionotophoresis 4mg /ml Dexamethasone, Patient/Family education, Balance training, and Stair training  PLAN FOR NEXT SESSION: trunk and hip strengthening, gait, balance, manual techniques, modalities PRN   Loralyn Freshwater PT, DPT  Physical Therapist - [facility]  [facility]  03/21/24, 1:45 PM

## 2024-03-23 ENCOUNTER — Telehealth: Payer: Self-pay

## 2024-03-23 NOTE — Telephone Encounter (Signed)
 VOB  Sumitted for [employer] and [employer] through [employer]

## 2024-03-23 NOTE — Telephone Encounter (Signed)
-----   Message from [place] Persons sent at 03/07/2024 12:52 PM EDT ----- I spoke with the patient today.  She does have a T-score of -3.1.  She has also had fracture makes her high risk for further fracture.  Have recommended [facility] followed by [employer].  Can we go forward and attempt authorization ----- Message ----- From: Interface, Rad Results In Sent: 03/07/2024   8:45 AM EDT To: [place] Persons, [place]

## 2024-03-26 ENCOUNTER — Ambulatory Visit: Payer: PPO

## 2024-03-27 DIAGNOSIS — G4733 Obstructive sleep apnea (adult) (pediatric): Secondary | ICD-10-CM | POA: Diagnosis not present

## 2024-03-28 ENCOUNTER — Ambulatory Visit: Payer: PPO

## 2024-03-28 DIAGNOSIS — R262 Difficulty in walking, not elsewhere classified: Secondary | ICD-10-CM | POA: Diagnosis not present

## 2024-03-28 DIAGNOSIS — R269 Unspecified abnormalities of gait and mobility: Secondary | ICD-10-CM

## 2024-03-28 DIAGNOSIS — M25552 Pain in left hip: Secondary | ICD-10-CM

## 2024-03-28 NOTE — Therapy (Signed)
 OUTPATIENT PHYSICAL THERAPY TREATMENT    Patient Name: Michelle Cisneros MRN: [id number] DOB:08-25-1956, 68 y.o., female Today's Date: 03/28/2024  END OF SESSION:  PT End of Session - 03/28/24 1031     Visit Number 14    Number of Visits 19    Date for PT Re-Evaluation 04/13/24    PT Start Time 1032    PT Stop Time 1114    PT Time Calculation (min) 42 min    Activity Tolerance Patient tolerated treatment well    Behavior During Therapy WFL for tasks assessed/performed                     Past Medical History:  Diagnosis Date   Aortic atherosclerosis (HCC)    Arthritis    Back pain    Basal cell carcinoma    L forehead, txted in past by another provider   Collagen vascular disease (HCC)    Depression    DOE (dyspnea on exertion)    a. 05/2020 Echo: EF 60-65%, no rwma, nl RV size/fxn.   History of stress test    a. 04/2020 Lexiscan MV: No ischemia/infarct.   Hyperlipemia    Hypertension    Melanoma (HCC) ~2015   R forearm txted in past by another provider   Recurrent UTI    Squamous cell carcinoma of skin    L med ankle, txted in past by another provider   Past Surgical History:  Procedure Laterality Date   ABDOMINAL HYSTERECTOMY     COLONOSCOPY WITH PROPOFOL N/A 05/26/2020   Procedure: COLONOSCOPY WITH PROPOFOL;  Surgeon: Toledo, Boykin Nearing, MD;  Location: [facility] END[facility]COPY;  Service: Gastroenterology;  Laterality: N/A;   INCONTINENCE SURGERY     OOPHORECTOMY     TOTAL HIP ARTHROPLASTY Left 12/11/2023   Procedure: TOTAL HIP ARTHROPLASTY ANTERIOR APPROACH;  Surgeon: Tarry Kos, MD;  Location: [facility] OR;  Service: Orthopedics;  Laterality: Left;   Patient Active Problem List   Diagnosis Date Noted   Age-related osteoporosis without current pathological fracture 01/16/2024   Closed subcapital fracture of neck of left femur, initial encounter (HCC) 12/09/2023   PSVT (paroxysmal supraventricular tachycardia) (HCC) 05/27/2023   Recurrent falls 08/16/2022    Hydrocephalus (HCC) 08/16/2022   Depression 08/16/2022   OSA (obstructive sleep apnea) 04/27/2022   Palpitations 10/03/2021   Dyspnea on exertion 04/19/2020   Aortic atherosclerosis (HCC) 04/19/2020   Essential hypertension 04/19/2020   Hyperlipidemia 04/19/2020   Right carpal tunnel syndrome 11/09/2016    PCP: Jerl Mina, MD   REFERRING PROVIDER: Tarry Kos, MD  REFERRING DIAG: [phone] (ICD-10-CM) - S/P total left hip arthroplasty  THERAPY DIAG:  Difficulty in walking, not elsewhere classified  Pain in left hip  Abnormality of gait and mobility  Rationale for Evaluation and Treatment: Rehabilitation  ONSET DATE: 12/11/2023  SUBJECTIVE:   SUBJECTIVE STATEMENT: L hip is good. No pain or discomfort. Has not been leading with her L LE to step up stairs.     PERTINENT HISTORY: S/P L THA on 12/11/2023, anterior approach secondary to a fall. [place], pt missed a step on the curb and fell onto her L side resulting in a fracture in L hip. Had home health PT which finished yesterday. Exercises included side stepping, marches, knee flexion, weight shifting, tip toes, steps, SAQ, SLR hip flexion, bridging. Has a hx of back pain  Blood pressure is controlled.  No latex allergies  PAIN:  Are you having pain?  Yes: NPRS scale: 0/10 Pain location: L anterior hip at incision Pain description: "pain and numbness" Aggravating factors: not provided Relieving factors: not provided  PRECAUTIONS: Anterior hip and Fall  RED FLAGS: Bowel or bladder incontinence: No and Cauda equina syndrome: No   WEIGHT BEARING RESTRICTIONS: WBAT  FALLS:  Has patient fallen in last 6 months? Yes. Number of falls Most recent fall was December 09, 2023 resulting on L hip fracture S/P THA anterior approach.   LIVING ENVIRONMENT: Lives with: lives with their spouse; daughter lives 15 minutes away. Lives in: House/apartment, first floor set up Stairs: Yes: Internal: 10  steps; can reach both and External: 0 steps; none Has following equipment at home: Single point cane, Walker - 2 wheeled, Tour manager, and Grab bars  OCCUPATION: Retired   PLOF: Independent  PATIENT GOALS: Be able to negotiate steps normally. Improve strength and balance  NEXT MD VISIT: around February 21, 2024  OBJECTIVE:  Note: Objective measures were completed at Evaluation unless otherwise noted.  DIAGNOSTIC FINDINGS:   PATIENT SURVEYS:  LEFS 30/80 (01/25/2024)  COGNITION: Overall cognitive status: Within functional limits for tasks assessed     SENSATION:   EDEMA:    POSTURE: forward flexed, decreased B hip extension  PALPATION:   LOWER EXTREMITY ROM:  Passive ROM Right eval Left eval  Hip flexion    Hip extension    Hip abduction    Hip adduction    Hip internal rotation    Hip external rotation    Knee flexion    Knee extension    Ankle dorsiflexion    Ankle plantarflexion    Ankle inversion    Ankle eversion     (Blank rows = not tested)  LOWER EXTREMITY MMT:  MMT Right eval Left eval  Hip flexion 4 4  Hip extension (seated manually resisted) 4+ 4+  Hip abduction (seated manually resisted) 4- 3+  Hip adduction    Hip internal rotation    Hip external rotation    Knee flexion    Knee extension 5 4+  Ankle dorsiflexion 4+ 4  Ankle plantarflexion    Ankle inversion    Ankle eversion     (Blank rows = not tested)  LOWER EXTREMITY SPECIAL TESTS:    FUNCTIONAL TESTS:  5 times sit to stand: 13.16 seconds without B UE assist Timed up and go (TUG): 11.26 seconds, 9.79 seconds, 9.28 seconds without use of AD Dynamic Gait Index: 16   GAIT: Distance walked: 30 ft Assistive device utilized: Single point cane Level of assistance: Modified independence Comments: Antalgic, decreased stance L LE, SPC on R, decreased B hip extension, forward flexed                                                                                                                                 TREATMENT DATE: 03/28/24  Therapeutic Activity:         Forward step up  onto first regular step with contralateral UE assist   L 10x3  Standing with B UE assist   Hip abduction   Yellow band around ankles   L 10x3   R 10x3  Single leg dead lift with contralateral UE and toe assist   L 10x2 with 5 second holds  Total gym   L single leg squat height 12 for 10x3 with eccentric lowering   Seated (on Low mat table)  13 lbs 10x5 seconds for 3 sets   To promote ability to negotiate stairs with less difficulty.   Forward step up onto 4 inch step with contralateral UE assist to no UE assist  L 10x3  Then forward step up onto and over 4 inch step with contralateral UE assist  L 10x3   Standing split squat with contralateral UE assist   L 10x   Answered pt questions     Improved technique, movement at target joints, use of target muscles after mod verbal, visual, tactile cues.      PATIENT EDUCATION:  Education details: POC Person educated: Patient Education method: Explanation Education comprehension: verbalized understanding  HOME EXERCISE PROGRAM: Access Code: [email or web address] URL: [email or web address] Date: 02/01/2024 Prepared by: Loralyn Freshwater  Exercises - Standing Partial Lunge  - 1 x daily - 7 x weekly - 3 sets - 10 reps - Standing Single Leg Stance with Unilateral Counter Support  - 1 x daily - 7 x weekly - 3 sets - 10 reps - 5 seconds hold - Standing Gastroc Stretch on Step with Counter Support  - 3 x daily - 7 x weekly - 1 sets - 10 reps - 30 seconds hold  - Seated Long Arc Quad with Ankle Weight  - 1 x daily - 7 x weekly - 3 sets - 10-15 reps - 5 seconds hold  Seated leg press seat 5  L plate 5 for 28U1   Can perform at the [employer]  - Standing Theme park manager on Step with Counter Support  - 3 x daily - 7 x weekly - 1 sets - 3 reps - 60 seconds hold   ASSESSMENT:  CLINICAL IMPRESSION:  Continued working on  improving L quadriceps concentric and eccentric strength for stair negotiation. Continued utilizing the Total gym to help pt perform L single leg squat movements without L knee pain to promote strength for to improve ability to perform the aforementioned task. Pt tolerated session well without aggravation of symptoms. Pt will benefit from continued skilled physical therapy services to improve the aforementioned deficit as well as to continue to improve strength, balance, and function.         OBJECTIVE IMPAIRMENTS: Abnormal gait, difficulty walking, decreased ROM, decreased strength, improper body mechanics, postural dysfunction, and pain.   ACTIVITY LIMITATIONS: carrying, lifting, standing, squatting, stairs, transfers, and locomotion level  PARTICIPATION LIMITATIONS:   PERSONAL FACTORS: Age, Fitness, Past/current experiences, and 3+ comorbidities: back pain, Dyspnea on exertion, HTN  are also affecting patient's functional outcome.   REHAB POTENTIAL: Fair    CLINICAL DECISION MAKING: Stable/uncomplicated  EVALUATION COMPLEXITY: Low   GOALS: Goals reviewed with patient? Yes  SHORT TERM GOALS: Target date: 02/10/2024 Pt will be independent with her initial HEP to improve strength, function, and ability to ambulate and perform standing tasks with less difficulty.  Baseline: Pt has not yet started her initial HEP (01/25/2024); no questions, able to do her HEP (03/12/2024) Goal status: Met    LONG TERM GOALS: Target date: 04/13/2024  Pt will  improve her Dynamic Gait Index (DGI) score to 19 or more as a demonstration of improved balance and decreased fall risk.   Baseline: DGI score 16 (01/25/2024); 19 (03/12/2024) Goal status: Met  2.  Pt will improve her LEFS score by at least 15 points as a demonstration of improved function.  Baseline: LEFS 30/80 (01/25/2024); 47/80 (03/12/2024) Goal status: MET  3.  Pt will improve B glute med and max strength by at least 1/2 MMT grade to promote  ability to ambulate and perform standing tasks with less difficulty.  Baseline:  MMT Right eval Left eval R (03/12/2024) L (03/12/2024)  Hip extension (seated manually resisted) 4+ 4+ 4+ 4+  Hip abduction (seated manually resisted) 4- 3+ 4+ 4+   Goal status: partially met  4.  Pt will improve her 5 times sit <> stand time to 10 seconds or less as a demonstration of improved functional LE strength  Baseline: 5 times sit to stand: 13.16 seconds without B UE assist (01/25/2024); 11.34 seconds (03/12/2024) Goal status: Progressing  5.  Pt will be able to ambulate 500 ft or more without AD and no LOB to promote mobility.  Baseline: Pt currently ambulating with SPC (01/25/2024); independent ambulation 500 ft + (03/12/2024) Goal status: MET     PLAN:  PT FREQUENCY: 1-2x/week  PT DURATION: 4 weeks  PLANNED INTERVENTIONS: 97110-Therapeutic exercises, 97530- Therapeutic activity, 97112- Neuromuscular re-education, 97535- Self Care, 16109- Manual therapy, 97116- Gait training, 97014- Electrical stimulation (unattended), [phone]- Ionotophoresis 4mg /ml Dexamethasone, Patient/Family education, Balance training, and Stair training  PLAN FOR NEXT SESSION: trunk and hip strengthening, gait, balance, manual techniques, modalities PRN   Loralyn Freshwater PT, DPT  Physical Therapist - [facility]  03/28/24, 12:16 PM

## 2024-04-02 ENCOUNTER — Ambulatory Visit: Payer: PPO

## 2024-04-02 DIAGNOSIS — R262 Difficulty in walking, not elsewhere classified: Secondary | ICD-10-CM

## 2024-04-02 DIAGNOSIS — M25552 Pain in left hip: Secondary | ICD-10-CM

## 2024-04-02 NOTE — Therapy (Signed)
 OUTPATIENT PHYSICAL THERAPY TREATMENT    Patient Name: Michelle Cisneros MRN: [id number] DOB:06/22/1956, 68 y.o., female Today's Date: 04/02/2024  END OF SESSION:  PT End of Session - 04/02/24 0901     Visit Number 15    Number of Visits 19    Date for PT Re-Evaluation 04/13/24    PT Start Time 0901    PT Stop Time 0945    PT Time Calculation (min) 44 min    Activity Tolerance Patient tolerated treatment well    Behavior During Therapy [facility] for tasks assessed/performed                      Past Medical History:  Diagnosis Date   Aortic atherosclerosis (HCC)    Arthritis    Back pain    Basal cell carcinoma    L forehead, txted in past by another provider   Collagen vascular disease (HCC)    Depression    DOE (dyspnea on exertion)    a. 05/2020 Echo: EF 60-65%, no rwma, nl RV size/fxn.   History of stress test    a. 04/2020 Lexiscan MV: No ischemia/infarct.   Hyperlipemia    Hypertension    Melanoma (HCC) ~2015   R forearm txted in past by another provider   Recurrent UTI    Squamous cell carcinoma of skin    L med ankle, txted in past by another provider   Past Surgical History:  Procedure Laterality Date   ABDOMINAL HYSTERECTOMY     COLONOSCOPY WITH PROPOFOL N/A 05/26/2020   Procedure: COLONOSCOPY WITH PROPOFOL;  Surgeon: Toledo, Alphonsus Jeans, MD;  Location: [facility] ENDOS[facility];  Service: Gastroenterology;  Laterality: N/A;   INCONTINENCE SURGERY     OOPHORECTOMY     TOTAL HIP ARTHROPLASTY Left 12/11/2023   Procedure: TOTAL HIP ARTHROPLASTY ANTERIOR APPROACH;  Surgeon: Wes Hamman, MD;  Location: [facility] OR;  Service: Orthopedics;  Laterality: Left;   Patient Active Problem List   Diagnosis Date Noted   Age-related osteoporosis without current pathological fracture 01/16/2024   Closed subcapital fracture of neck of left femur, initial encounter (HCC) 12/09/2023   PSVT (paroxysmal supraventricular tachycardia) (HCC) 05/27/2023   Recurrent falls 08/16/2022    Hydrocephalus (HCC) 08/16/2022   Depression 08/16/2022   OSA (obstructive sleep apnea) 04/27/2022   Palpitations 10/03/2021   Dyspnea on exertion 04/19/2020   Aortic atherosclerosis (HCC) 04/19/2020   Essential hypertension 04/19/2020   Hyperlipidemia 04/19/2020   Right carpal tunnel syndrome 11/09/2016    PCP: Lyle San, MD   REFERRING PROVIDER: Wes Hamman, MD  REFERRING DIAG: [phone] (ICD-10-CM) - S/P total left hip arthroplasty  THERAPY DIAG:  Difficulty in walking, not elsewhere classified  Pain in left hip  Rationale for Evaluation and Treatment: Rehabilitation  ONSET DATE: 12/11/2023  SUBJECTIVE:   SUBJECTIVE STATEMENT: Had L knee pain earlier for a couple of seconds when standing and brushing her hair. The knee pain goes away, happens seldom. No L hip or knee pain currently. Has been trying to walk more.     PERTINENT HISTORY: S/P L THA on 12/11/2023, anterior approach secondary to a fall. [place], pt missed a step on the curb and fell onto her L side resulting in a fracture in L hip. Had home health PT which finished yesterday. Exercises included side stepping, marches, knee flexion, weight shifting, tip toes, steps, SAQ, SLR hip flexion, bridging. Has a hx of back pain  Blood pressure is controlled.  No latex allergies  PAIN:  Are you having pain? Yes: NPRS scale: 0/10 Pain location: L anterior hip at incision Pain description: "pain and numbness" Aggravating factors: not provided Relieving factors: not provided  PRECAUTIONS: Anterior hip and Fall  RED FLAGS: Bowel or bladder incontinence: No and Cauda equina syndrome: No   WEIGHT BEARING RESTRICTIONS: WBAT  FALLS:  Has patient fallen in last 6 months? Yes. Number of falls Most recent fall was December 09, 2023 resulting on L hip fracture S/P THA anterior approach.   LIVING ENVIRONMENT: Lives with: lives with their spouse; daughter lives 15 minutes away. Lives in:  House/apartment, first floor set up Stairs: Yes: Internal: 10 steps; can reach both and External: 0 steps; none Has following equipment at home: Single point cane, Walker - 2 wheeled, Tour manager, and Grab bars  OCCUPATION: Retired   PLOF: Independent  PATIENT GOALS: Be able to negotiate steps normally. Improve strength and balance  NEXT MD VISIT: around February 21, 2024  OBJECTIVE:  Note: Objective measures were completed at Evaluation unless otherwise noted.  DIAGNOSTIC FINDINGS:   PATIENT SURVEYS:  LEFS 30/80 (01/25/2024)  COGNITION: Overall cognitive status: Within functional limits for tasks assessed     SENSATION:   EDEMA:    POSTURE: forward flexed, decreased B hip extension  PALPATION:   LOWER EXTREMITY ROM:  Passive ROM Right eval Left eval  Hip flexion    Hip extension    Hip abduction    Hip adduction    Hip internal rotation    Hip external rotation    Knee flexion    Knee extension    Ankle dorsiflexion    Ankle plantarflexion    Ankle inversion    Ankle eversion     (Blank rows = not tested)  LOWER EXTREMITY MMT:  MMT Right eval Left eval  Hip flexion 4 4  Hip extension (seated manually resisted) 4+ 4+  Hip abduction (seated manually resisted) 4- 3+  Hip adduction    Hip internal rotation    Hip external rotation    Knee flexion    Knee extension 5 4+  Ankle dorsiflexion 4+ 4  Ankle plantarflexion    Ankle inversion    Ankle eversion     (Blank rows = not tested)  LOWER EXTREMITY SPECIAL TESTS:    FUNCTIONAL TESTS:  5 times sit to stand: 13.16 seconds without B UE assist Timed up and go (TUG): 11.26 seconds, 9.79 seconds, 9.28 seconds without use of AD Dynamic Gait Index: 16   GAIT: Distance walked: 30 ft Assistive device utilized: Single point cane Level of assistance: Modified independence Comments: Antalgic, decreased stance L LE, SPC on R, decreased B hip extension, forward flexed                                                                                                                                 TREATMENT DATE: 04/02/24  Therapeutic Activity:  NuStep seat 6 level 3 x 5 minutes for LE's only to promote quadriceps strengthening.   Medium level difficulty reported.   Standing with B UE assist   Hip abduction   Yellow band around ankles   L 10x3   R 10x3   Total gym   L single leg squat height 12 for 10x3 with eccentric lowering   Seated (on Low mat table) leg extension 13 lbs 10x5 seconds for 3 sets   To promote ability to negotiate stairs with less difficulty.   S/L hip abduction   L 10x3  Standing with B UE assist  leg press with charcoal band   L 10x3  Single leg dead lift with contralateral UE and toe assist   L 10x3   Forward step up onto 4 inch step with contralateral UE assist to no UE assist  L 10x3  Then forward step up onto and over 4 inch step with contralateral UE assist  L 10x3   Forward step up onto first regular step with contralateral UE assist   L 10x  With yellow band resisting hip abduction/ER during last 5 repetitions   Standing split squat with contralateral UE assist   L 10x, then 4x  L knee discomfort  Seated knee flexion targeting medial hamstrings green band 10x3    Improved technique, movement at target joints, use of target muscles after mod verbal, visual, tactile cues.      PATIENT EDUCATION:  Education details: POC Person educated: Patient Education method: Explanation Education comprehension: verbalized understanding  HOME EXERCISE PROGRAM: Access Code: [email or web address] URL: [email or web address] Date: 02/01/2024 Prepared by: Loralyn Freshwater  Exercises - Standing Partial Lunge  - 1 x daily - 7 x weekly - 3 sets - 10 reps - Standing Single Leg Stance with Unilateral Counter Support  - 1 x daily - 7 x weekly - 3 sets - 10 reps - 5 seconds hold - Standing Gastroc Stretch on Step with Counter Support  - 3 x  daily - 7 x weekly - 1 sets - 10 reps - 30 seconds hold  - Seated Long Arc Quad with Ankle Weight  - 1 x daily - 7 x weekly - 3 sets - 10-15 reps - 5 seconds hold  Seated leg press seat 5  L plate 5 for 16X0   Can perform at the [employer]  - Standing Theme park manager on Step with Counter Support  - 3 x daily - 7 x weekly - 1 sets - 3 reps - 60 seconds hold   ASSESSMENT:  CLINICAL IMPRESSION:  Continued working on improving L quadriceps strength for stair negotiation. Continued utilizing the [employer] gym to help pt perform L single leg squat movements without L knee pain to promote strength for to improve ability to perform the aforementioned task. Challenges to progress include knee joint pain. Pt tolerated session well without aggravation of symptoms. Pt will benefit from continued skilled physical therapy services to improve the aforementioned deficit as well as to continue to improve strength, balance, and function.         OBJECTIVE IMPAIRMENTS: Abnormal gait, difficulty walking, decreased ROM, decreased strength, improper body mechanics, postural dysfunction, and pain.   ACTIVITY LIMITATIONS: carrying, lifting, standing, squatting, stairs, transfers, and locomotion level  PARTICIPATION LIMITATIONS:   PERSONAL FACTORS: Age, Fitness, Past/current experiences, and 3+ comorbidities: back pain, Dyspnea on exertion, HTN  are also affecting patient's functional outcome.   REHAB POTENTIAL: Fair    CLINICAL DECISION MAKING: Stable/uncomplicated  EVALUATION COMPLEXITY: Low   GOALS: Goals reviewed with patient? Yes  SHORT TERM GOALS: Target date: 02/10/2024 Pt will be independent with her initial HEP to improve strength, function, and ability to ambulate and perform standing tasks with less difficulty.  Baseline: Pt has not yet started her initial HEP (01/25/2024); no questions, able to do her HEP (03/12/2024) Goal status: Met    LONG TERM GOALS: Target date: 04/13/2024  Pt will improve  her Dynamic Gait Index (DGI) score to 19 or more as a demonstration of improved balance and decreased fall risk.   Baseline: DGI score 16 (01/25/2024); 19 (03/12/2024) Goal status: Met  2.  Pt will improve her LEFS score by at least 15 points as a demonstration of improved function.  Baseline: LEFS 30/80 (01/25/2024); 47/80 (03/12/2024) Goal status: MET  3.  Pt will improve B glute med and max strength by at least 1/2 MMT grade to promote ability to ambulate and perform standing tasks with less difficulty.  Baseline:  MMT Right eval Left eval R (03/12/2024) L (03/12/2024)  Hip extension (seated manually resisted) 4+ 4+ 4+ 4+  Hip abduction (seated manually resisted) 4- 3+ 4+ 4+   Goal status: partially met  4.  Pt will improve her 5 times sit <> stand time to 10 seconds or less as a demonstration of improved functional LE strength  Baseline: 5 times sit to stand: 13.16 seconds without B UE assist (01/25/2024); 11.34 seconds (03/12/2024) Goal status: Progressing  5.  Pt will be able to ambulate 500 ft or more without AD and no LOB to promote mobility.  Baseline: Pt currently ambulating with SPC (01/25/2024); independent ambulation 500 ft + (03/12/2024) Goal status: MET     PLAN:  PT FREQUENCY: 1-2x/week  PT DURATION: 4 weeks  PLANNED INTERVENTIONS: 97110-Therapeutic exercises, 97530- Therapeutic activity, 97112- Neuromuscular re-education, 97535- Self Care, 40981- Manual therapy, 97116- Gait training, 97014- Electrical stimulation (unattended), [phone]- Ionotophoresis 4mg /ml Dexamethasone, Patient/Family education, Balance training, and Stair training  PLAN FOR NEXT SESSION: trunk and hip strengthening, gait, balance, manual techniques, modalities PRN   Suzzane Estes PT, DPT  Physical Therapist - [facility]  04/02/24, 12:54 PM

## 2024-04-04 ENCOUNTER — Ambulatory Visit: Payer: PPO

## 2024-04-09 ENCOUNTER — Ambulatory Visit: Payer: PPO

## 2024-04-09 ENCOUNTER — Encounter: Payer: Self-pay | Admitting: Urology

## 2024-04-09 ENCOUNTER — Ambulatory Visit: Admitting: Urology

## 2024-04-09 VITALS — BP 106/67 | HR 65 | Ht 66.0 in | Wt 164.0 lb

## 2024-04-09 DIAGNOSIS — R262 Difficulty in walking, not elsewhere classified: Secondary | ICD-10-CM

## 2024-04-09 DIAGNOSIS — N3946 Mixed incontinence: Secondary | ICD-10-CM | POA: Diagnosis not present

## 2024-04-09 DIAGNOSIS — N3020 Other chronic cystitis without hematuria: Secondary | ICD-10-CM | POA: Diagnosis not present

## 2024-04-09 DIAGNOSIS — M25552 Pain in left hip: Secondary | ICD-10-CM

## 2024-04-09 LAB — URINALYSIS, COMPLETE
Bilirubin, UA: NEGATIVE
Glucose, UA: NEGATIVE
Ketones, UA: NEGATIVE
Leukocytes,UA: NEGATIVE
Nitrite, UA: NEGATIVE
Protein,UA: NEGATIVE
RBC, UA: NEGATIVE
Specific Gravity, UA: 1.02 (ref 1.005–1.030)
Urobilinogen, Ur: 0.2 mg/dL (ref 0.2–1.0)
pH, UA: 7 (ref 5.0–7.5)

## 2024-04-09 LAB — MICROSCOPIC EXAMINATION: Bacteria, UA: NONE SEEN

## 2024-04-09 MED ORDER — NITROFURANTOIN MONOHYD MACRO 100 MG PO CAPS
100.0000 mg | ORAL_CAPSULE | Freq: Every day | ORAL | 3 refills | Status: AC
Start: 2024-04-09 — End: ?

## 2024-04-09 NOTE — Therapy (Signed)
 OUTPATIENT PHYSICAL THERAPY TREATMENT    Patient Name: Michelle Cisneros MRN: [id number] DOB:02/17/1956, 68 y.o., female Today's Date: 04/09/2024  END OF SESSION:  PT End of Session - 04/09/24 1435     Visit Number 16    Number of Visits 19    Date for PT Re-Evaluation 04/13/24    PT Start Time 1435    PT Stop Time 1516    PT Time Calculation (min) 41 min    Activity Tolerance Patient tolerated treatment well    Behavior During Therapy [facility] for tasks assessed/performed                       Past Medical History:  Diagnosis Date   Aortic atherosclerosis (HCC)    Arthritis    Back pain    Basal cell carcinoma    L forehead, txted in past by another provider   Collagen vascular disease (HCC)    Depression    DOE (dyspnea on exertion)    a. 05/2020 Echo: EF 60-65%, no rwma, nl RV size/fxn.   History of stress test    a. 04/2020 Lexiscan  MV: No ischemia/infarct.   Hyperlipemia    Hypertension    Melanoma (HCC) ~2015   R forearm txted in past by another provider   Recurrent UTI    Squamous cell carcinoma of skin    L med ankle, txted in past by another provider   Past Surgical History:  Procedure Laterality Date   ABDOMINAL HYSTERECTOMY     COLONOSCOPY WITH PROPOFOL  N/A 05/26/2020   Procedure: COLONOSCOPY WITH PROPOFOL ;  Surgeon: Toledo, Alphonsus Jeans, MD;  Location: [facility] ENDOSCOPY;  Service: Gastroenterology;  Laterality: N/A;   INCONTINENCE SURGERY     OOPHORECTOMY     TOTAL HIP ARTHROPLASTY Left 12/11/2023   Procedure: TOTAL HIP ARTHROPLASTY ANTERIOR APPROACH;  Surgeon: Wes Hamman, MD;  Location: [facility] OR;  Service: Orthopedics;  Laterality: Left;   Patient Active Problem List   Diagnosis Date Noted   Age-related osteoporosis without current pathological fracture 01/16/2024   Closed subcapital fracture of neck of left femur, initial encounter (HCC) 12/09/2023   PSVT (paroxysmal supraventricular tachycardia) (HCC) 05/27/2023   Recurrent falls 08/16/2022    Hydrocephalus (HCC) 08/16/2022   Depression 08/16/2022   OSA (obstructive sleep apnea) 04/27/2022   Palpitations 10/03/2021   Dyspnea on exertion 04/19/2020   Aortic atherosclerosis (HCC) 04/19/2020   Essential hypertension 04/19/2020   Hyperlipidemia 04/19/2020   Right carpal tunnel syndrome 11/09/2016    PCP: Lyle San, MD   REFERRING PROVIDER: Wes Hamman, MD  REFERRING DIAG: [phone] (ICD-10-CM) - S/P total left hip arthroplasty  THERAPY DIAG:  Difficulty in walking, not elsewhere classified  Pain in left hip  Rationale for Evaluation and Treatment: Rehabilitation  ONSET DATE: 12/11/2023  SUBJECTIVE:   SUBJECTIVE STATEMENT: L knee is doing ok. L hip is doing ok, has not had any problems with it.     PERTINENT HISTORY: S/P L THA on 12/11/2023, anterior approach secondary to a fall. [place], pt missed a step on the curb and fell onto her L side resulting in a fracture in L hip. Had home health PT which finished yesterday. Exercises included side stepping, marches, knee flexion, weight shifting, tip toes, steps, SAQ, SLR hip flexion, bridging. Has a hx of back pain  Blood pressure is controlled.  No latex allergies  PAIN:  Are you having pain? Yes: NPRS scale: 0/10 Pain location: L  anterior hip at incision Pain description: "pain and numbness" Aggravating factors: not provided Relieving factors: not provided  PRECAUTIONS: Anterior hip and Fall  RED FLAGS: Bowel or bladder incontinence: No and Cauda equina syndrome: No   WEIGHT BEARING RESTRICTIONS: WBAT  FALLS:  Has patient fallen in last 6 months? Yes. Number of falls Most recent fall was December 09, 2023 resulting on L hip fracture S/P THA anterior approach.   LIVING ENVIRONMENT: Lives with: lives with their spouse; daughter lives 15 minutes away. Lives in: House/apartment, first floor set up Stairs: Yes: Internal: 10 steps; can reach both and External: 0 steps; none Has  following equipment at home: Single point cane, Walker - 2 wheeled, Tour manager, and Grab bars  OCCUPATION: Retired   PLOF: Independent  PATIENT GOALS: Be able to negotiate steps normally. Improve strength and balance  NEXT MD VISIT: around February 21, 2024  OBJECTIVE:  Note: Objective measures were completed at Evaluation unless otherwise noted.  DIAGNOSTIC FINDINGS:   PATIENT SURVEYS:  LEFS 30/80 (01/25/2024)  COGNITION: Overall cognitive status: Within functional limits for tasks assessed     SENSATION:   EDEMA:    POSTURE: forward flexed, decreased B hip extension  PALPATION:   LOWER EXTREMITY ROM:  Passive ROM Right eval Left eval  Hip flexion    Hip extension    Hip abduction    Hip adduction    Hip internal rotation    Hip external rotation    Knee flexion    Knee extension    Ankle dorsiflexion    Ankle plantarflexion    Ankle inversion    Ankle eversion     (Blank rows = not tested)  LOWER EXTREMITY MMT:  MMT Right eval Left eval  Hip flexion 4 4  Hip extension (seated manually resisted) 4+ 4+  Hip abduction (seated manually resisted) 4- 3+  Hip adduction    Hip internal rotation    Hip external rotation    Knee flexion    Knee extension 5 4+  Ankle dorsiflexion 4+ 4  Ankle plantarflexion    Ankle inversion    Ankle eversion     (Blank rows = not tested)  LOWER EXTREMITY SPECIAL TESTS:    FUNCTIONAL TESTS:  5 times sit to stand: 13.16 seconds without B UE assist Timed up and go (TUG): 11.26 seconds, 9.79 seconds, 9.28 seconds without use of AD Dynamic Gait Index: 16   GAIT: Distance walked: 30 ft Assistive device utilized: Single point cane Level of assistance: Modified independence Comments: Antalgic, decreased stance L LE, SPC on R, decreased B hip extension, forward flexed                                                                                                                                TREATMENT DATE:  04/09/24  Therapeutic Activity:         NuStep seat 6 level 3 x 5 minutes for LE's  only to promote quadriceps strengthening.   Medium level difficulty reported.   Reviewed POC: continue with PT until the end of POC this week then graduate to her HEP   Total gym   L single leg squat height 11.5 for 10x3 with eccentric lowering  Standing with B UE assist   Hip abduction   Yellow band around ankles   L 10x3   R 10x3   Side stepping with yellow band around knees 30 ft to the R and 30 ft to the L   Then 20 ft to the R and 20 ft to the L  Standing with B UE assist  leg press with charcoal band   L 10x4  Forward step up onto 4 inch step with contralateral UE assist to no UE assist  L 10x3  Then forward step up onto and over 4 inch step with contralateral UE assist  L 10x3  Single leg dead lift with contralateral UE and toe assist   L 10x3   Forward step up onto first regular step with contralateral UE assist   L 10x  With yellow band resisting hip abduction/ER      Improved technique, movement at target joints, use of target muscles after mod verbal, visual, tactile cues.      PATIENT EDUCATION:  Education details: POC Person educated: Patient Education method: Explanation Education comprehension: verbalized understanding  HOME EXERCISE PROGRAM: Access Code: [email or web address] URL: [email or web address] Date: 02/01/2024 Prepared by: Suzzane Estes  Exercises - Standing Partial Lunge  - 1 x daily - 7 x weekly - 3 sets - 10 reps - Standing Single Leg Stance with Unilateral Counter Support  - 1 x daily - 7 x weekly - 3 sets - 10 reps - 5 seconds hold - Standing Gastroc Stretch on Step with Counter Support  - 3 x daily - 7 x weekly - 1 sets - 10 reps - 30 seconds hold  - Seated Long Arc Quad with Ankle Weight  - 1 x daily - 7 x weekly - 3 sets - 10-15 reps - 5 seconds hold  Seated leg press seat 5  L plate 5 for [id number]   Can perform at the [employer]  -  Standing Theme park manager on Step with Counter Support  - 3 x daily - 7 x weekly - 1 sets - 3 reps - 60 seconds hold   ASSESSMENT:  CLINICAL IMPRESSION:  Continued working on improving L glute and quadriceps strength for stair negotiation. Continued utilizing the Total gym to help pt perform L single leg squat movements without L knee pain to promote strength for to improve ability to perform the aforementioned task. Challenges to progress include knee joint pain. Pt tolerated session well without aggravation of symptoms. Pt will benefit from continued skilled physical therapy services to improve the aforementioned deficit as well as to continue to improve strength, balance, and function.         OBJECTIVE IMPAIRMENTS: Abnormal gait, difficulty walking, decreased ROM, decreased strength, improper body mechanics, postural dysfunction, and pain.   ACTIVITY LIMITATIONS: carrying, lifting, standing, squatting, stairs, transfers, and locomotion level  PARTICIPATION LIMITATIONS:   PERSONAL FACTORS: Age, Fitness, Past/current experiences, and 3+ comorbidities: back pain, Dyspnea on exertion, HTN  are also affecting patient's functional outcome.   REHAB POTENTIAL: Fair    CLINICAL DECISION MAKING: Stable/uncomplicated  EVALUATION COMPLEXITY: Low   GOALS: Goals reviewed with patient? Yes  SHORT TERM GOALS: Target date: 02/10/2024 Pt will be independent with her  initial HEP to improve strength, function, and ability to ambulate and perform standing tasks with less difficulty.  Baseline: Pt has not yet started her initial HEP (01/25/2024); no questions, able to do her HEP (03/12/2024) Goal status: Met    LONG TERM GOALS: Target date: 04/13/2024  Pt will improve her Dynamic Gait Index (DGI) score to 19 or more as a demonstration of improved balance and decreased fall risk.   Baseline: DGI score 16 (01/25/2024); 19 (03/12/2024) Goal status: Met  2.  Pt will improve her LEFS score by at least 15  points as a demonstration of improved function.  Baseline: LEFS 30/80 (01/25/2024); 47/80 (03/12/2024) Goal status: MET  3.  Pt will improve B glute med and max strength by at least 1/2 MMT grade to promote ability to ambulate and perform standing tasks with less difficulty.  Baseline:  MMT Right eval Left eval R (03/12/2024) L (03/12/2024)  Hip extension (seated manually resisted) 4+ 4+ 4+ 4+  Hip abduction (seated manually resisted) 4- 3+ 4+ 4+   Goal status: partially met  4.  Pt will improve her 5 times sit <> stand time to 10 seconds or less as a demonstration of improved functional LE strength  Baseline: 5 times sit to stand: 13.16 seconds without B UE assist (01/25/2024); 11.34 seconds (03/12/2024) Goal status: Progressing  5.  Pt will be able to ambulate 500 ft or more without AD and no LOB to promote mobility.  Baseline: Pt currently ambulating with SPC (01/25/2024); independent ambulation 500 ft + (03/12/2024) Goal status: MET     PLAN:  PT FREQUENCY: 1-2x/week  PT DURATION: 4 weeks  PLANNED INTERVENTIONS: 97110-Therapeutic exercises, 97530- Therapeutic activity, 97112- Neuromuscular re-education, 97535- Self Care, 09811- Manual therapy, [id number]- Gait training, 97014- Electrical stimulation (unattended), [phone]- Ionotophoresis 4mg /ml Dexamethasone , Patient/Family education, Balance training, and Stair training  PLAN FOR NEXT SESSION: trunk and hip strengthening, gait, balance, manual techniques, modalities PRN   Suzzane Estes PT, DPT  Physical Therapist - [facility]  04/09/24, 4:04 PM

## 2024-04-09 NOTE — Progress Notes (Signed)
 04/09/2024 1:42 PM   Michelle Cisneros 30-Apr-1956 [id number]  Referring provider: Lyle San, MD [address] [place],  [place] [address]  Chief Complaint  Patient presents with   Urinary Incontinence    HPI: Reviewed notes.  Frequency stable.  Infection free on trimethoprim .  Her prescription ran out she had another infection in February.  Clinically not infected today   On chart review it appears she was getting breakthrough infections on trimethoprim  and I switch her to daily Macrobid  reassess 1 year on daily Macrodantin   Today Infection free.  Frequency stable.  Rare risk of pulmonary fibrosis discussed.  She like to stay on it.  Clinically not infected and urinalysis negative    PMH: Past Medical History:  Diagnosis Date   Aortic atherosclerosis (HCC)    Arthritis    Back pain    Basal cell carcinoma    L forehead, txted in past by another provider   Collagen vascular disease (HCC)    Depression    DOE (dyspnea on exertion)    a. 05/2020 Echo: EF 60-65%, no rwma, nl RV size/fxn.   History of stress test    a. 04/2020 Lexiscan  MV: No ischemia/infarct.   Hyperlipemia    Hypertension    Melanoma (HCC) ~2015   R forearm txted in past by another provider   Recurrent UTI    Squamous cell carcinoma of skin    L med ankle, txted in past by another provider    Surgical History: Past Surgical History:  Procedure Laterality Date   ABDOMINAL HYSTERECTOMY     COLONOSCOPY WITH PROPOFOL  N/A 05/26/2020   Procedure: COLONOSCOPY WITH PROPOFOL ;  Surgeon: Toledo, Alphonsus Jeans, MD;  Location: [facility] ENDOSCOPY;  Service: Gastroenterology;  Laterality: N/A;   INCONTINENCE SURGERY     OOPHORECTOMY     TOTAL HIP ARTHROPLASTY Left 12/11/2023   Procedure: TOTAL HIP ARTHROPLASTY ANTERIOR APPROACH;  Surgeon: Wes Hamman, MD;  Location: [facility] OR;  Service: Orthopedics;  Laterality: Left;    Home Medications:  Allergies as of 04/09/2024   No Known Allergies       Medication List        Accurate as of April 09, 2024  1:42 PM. If you have any questions, ask your nurse or doctor.          STOP taking these medications    docusate sodium  100 MG capsule Commonly known as: COLACE Stopped by: Keyshun Elpers A Indie Boehne   enoxaparin  40 MG/0.4ML injection Commonly known as: LOVENOX  Stopped by: Geralyn Knee A Wyndham Santilli   HYDROcodone -acetaminophen  5-325 MG tablet Commonly known as: Norco Stopped by: De Jaworski A Deaunna Olarte   metroNIDAZOLE  0.75 % cream Commonly known as: METROCREAM  Stopped by: Geralyn Knee A Mitsy Owen   oxyCODONE -acetaminophen  5-325 MG tablet Commonly known as: Percocet Stopped by: Shiri Hodapp A Lashae Wollenberg   polyethylene glycol 17 g packet Commonly known as: MIRALAX  / GLYCOLAX  Stopped by: Geralyn Knee A Demarea Lorey       TAKE these medications    atorvastatin  10 MG tablet Commonly known as: LIPITOR TAKE ONE TABLET BY MOUTH DAILY   buPROPion  300 MG 24 hr tablet Commonly known as: WELLBUTRIN  XL TAKE ONE TABLET BY MOUTH DAILY   Calcium  Carbonate-Vitamin D  600-400 MG-UNIT tablet Take 1 tablet by mouth daily.   fluticasone  50 MCG/ACT nasal spray Commonly known as: FLONASE  Place 1 spray into both nostrils daily as needed for allergies.   lisinopril  20 MG tablet Commonly known as: ZESTRIL  Take 1 tablet (20 mg total) by  mouth daily.   multivitamin with minerals Tabs tablet Take 1 tablet by mouth daily.   mupirocin  ointment 2 % Commonly known as: BACTROBAN  Place 1 Application into the nose 2 (two) times daily.   nitrofurantoin  (macrocrystal-monohydrate) 100 MG capsule Commonly known as: MACROBID  Take 100 mg by mouth daily.        Allergies: No Known Allergies  Family History: Family History  Problem Relation Age of Onset   Cancer Father        laryngeal   Atrial fibrillation Father    Stroke Mother    Breast cancer Neg Hx    Bladder Cancer Neg Hx    Kidney cancer Neg Hx     Social History:  reports that she quit smoking about  36 years ago. Her smoking use included cigarettes. She started smoking about 66 years ago. She has a 30 pack-year smoking history. She has been exposed to tobacco smoke. She has never used smokeless tobacco. She reports current alcohol use. She reports that she does not currently use drugs.  ROS:                                        Physical Exam: BP 106/67   Pulse 65   Ht 5\' 6"  (1.676 m)   Wt 74.4 kg   BMI 26.47 kg/m   Constitutional:  Alert and oriented, No acute distress. HEENT: [place] AT, moist mucus membranes.  Trachea midline, no masses.   Laboratory Data: Lab Results  Component Value Date   WBC 9.5 12/13/2023   HGB 12.3 12/13/2023   HCT 35.6 (L) 12/13/2023   MCV 90.1 12/13/2023   PLT 211 12/13/2023    Lab Results  Component Value Date   CREATININE 0.64 12/13/2023    No results found for: "PSA"  No results found for: "TESTOSTERONE"  No results found for: "HGBA1C"  Urinalysis    Component Value Date/Time   COLORURINE YELLOW 12/02/2023 1652   APPEARANCEUR HAZY (A) 12/02/2023 1652   APPEARANCEUR Clear 03/21/2023 0837   LABSPEC 1.025 12/02/2023 1652   PHURINE 6.0 12/02/2023 1652   GLUCOSEU NEGATIVE 12/02/2023 1652   HGBUR SMALL (A) 12/02/2023 1652   BILIRUBINUR NEGATIVE 12/02/2023 1652   BILIRUBINUR Negative 03/21/2023 0837   KETONESUR TRACE (A) 12/02/2023 1652   PROTEINUR NEGATIVE 12/02/2023 1652   NITRITE NEGATIVE 12/02/2023 1652   LEUKOCYTESUR MODERATE (A) 12/02/2023 1652    Pertinent Imaging:   Assessment & Plan: 90 x 3 Macrodantin  sent to pharmacy and I will see in a year  1. Mixed incontinence (Primary)  - Urinalysis, Complete  2. Chronic cystitis  - Urinalysis, Complete   No follow-ups on file.  Devorah Fonder, MD  [facility] [address] [place], [place] [address] [phone]

## 2024-04-10 ENCOUNTER — Other Ambulatory Visit: Payer: Self-pay | Admitting: Family Medicine

## 2024-04-10 DIAGNOSIS — Z1231 Encounter for screening mammogram for malignant neoplasm of breast: Secondary | ICD-10-CM

## 2024-04-11 ENCOUNTER — Ambulatory Visit: Payer: PPO

## 2024-04-11 DIAGNOSIS — R262 Difficulty in walking, not elsewhere classified: Secondary | ICD-10-CM | POA: Diagnosis not present

## 2024-04-11 DIAGNOSIS — M25552 Pain in left hip: Secondary | ICD-10-CM

## 2024-04-11 NOTE — Therapy (Signed)
 OUTPATIENT PHYSICAL THERAPY TREATMENT And Discharge Summary    Patient Name: Michelle Cisneros MRN: [id number] DOB:04/22/1956, 68 y.o., female Today's Date: 04/11/2024  END OF SESSION:  PT End of Session - 04/11/24 1347     Visit Number 17    Number of Visits 19    Date for PT Re-Evaluation 04/13/24    PT Start Time 1348    PT Stop Time 1430    PT Time Calculation (min) 42 min    Activity Tolerance Patient tolerated treatment well    Behavior During Therapy WFL for tasks assessed/performed                        Past Medical History:  Diagnosis Date   Aortic atherosclerosis (HCC)    Arthritis    Back pain    Basal cell carcinoma    L forehead, txted in past by another provider   Collagen vascular disease (HCC)    Depression    DOE (dyspnea on exertion)    a. 05/2020 Echo: EF 60-65%, no rwma, nl RV size/fxn.   History of stress test    a. 04/2020 Lexiscan  MV: No ischemia/infarct.   Hyperlipemia    Hypertension    Melanoma (HCC) ~2015   R forearm txted in past by another provider   Recurrent UTI    Squamous cell carcinoma of skin    L med ankle, txted in past by another provider   Past Surgical History:  Procedure Laterality Date   ABDOMINAL HYSTERECTOMY     COLONOSCOPY WITH PROPOFOL  N/A 05/26/2020   Procedure: COLONOSCOPY WITH PROPOFOL ;  Surgeon: Toledo, Alphonsus Jeans, MD;  Location: [facility];  Service: Gastroenterology;  Laterality: N/A;   INCONTINENCE SURGERY     OOPHORECTOMY     TOTAL HIP ARTHROPLASTY Left 12/11/2023   Procedure: TOTAL HIP ARTHROPLASTY ANTERIOR APPROACH;  Surgeon: Wes Hamman, MD;  Location: [facility] OR;  Service: Orthopedics;  Laterality: Left;   Patient Active Problem List   Diagnosis Date Noted   Age-related osteoporosis without current pathological fracture 01/16/2024   Closed subcapital fracture of neck of left femur, initial encounter (HCC) 12/09/2023   PSVT (paroxysmal supraventricular tachycardia) (HCC) 05/27/2023    Recurrent falls 08/16/2022   Hydrocephalus (HCC) 08/16/2022   Depression 08/16/2022   OSA (obstructive sleep apnea) 04/27/2022   Palpitations 10/03/2021   Dyspnea on exertion 04/19/2020   Aortic atherosclerosis (HCC) 04/19/2020   Essential hypertension 04/19/2020   Hyperlipidemia 04/19/2020   Right carpal tunnel syndrome 11/09/2016    PCP: Lyle San, MD   REFERRING PROVIDER: Wes Hamman, MD  REFERRING DIAG: [phone] (ICD-10-CM) - S/P total left hip arthroplasty  THERAPY DIAG:  Difficulty in walking, not elsewhere classified  Pain in left hip  Rationale for Evaluation and Treatment: Rehabilitation  ONSET DATE: 12/11/2023  SUBJECTIVE:   SUBJECTIVE STATEMENT: Started a course at the gym for strength which bothered her L knee. Had to have some modifications.  No L hip pain currently. No L knee pain currently. Was ok after last session.     PERTINENT HISTORY: S/P L THA on 12/11/2023, anterior approach secondary to a fall. [place], pt missed a step on the curb and fell onto her L side resulting in a fracture in L hip. Had home health PT which finished yesterday. Exercises included side stepping, marches, knee flexion, weight shifting, tip toes, steps, SAQ, SLR hip flexion, bridging. Has a hx of back pain  Blood  pressure is controlled.  No latex allergies  PAIN:  Are you having pain? Yes: NPRS scale: 0/10 Pain location: L anterior hip at incision Pain description: "pain and numbness" Aggravating factors: not provided Relieving factors: not provided  PRECAUTIONS: Anterior hip and Fall  RED FLAGS: Bowel or bladder incontinence: No and Cauda equina syndrome: No   WEIGHT BEARING RESTRICTIONS: WBAT  FALLS:  Has patient fallen in last 6 months? Yes. Number of falls Most recent fall was December 09, 2023 resulting on L hip fracture S/P THA anterior approach.   LIVING ENVIRONMENT: Lives with: lives with their spouse; daughter lives 15 minutes  away. Lives in: House/apartment, first floor set up Stairs: Yes: Internal: 10 steps; can reach both and External: 0 steps; none Has following equipment at home: Single point cane, Walker - 2 wheeled, Tour manager, and Grab bars  OCCUPATION: Retired   PLOF: Independent  PATIENT GOALS: Be able to negotiate steps normally. Improve strength and balance  NEXT MD VISIT: around February 21, 2024  OBJECTIVE:  Note: Objective measures were completed at Evaluation unless otherwise noted.  DIAGNOSTIC FINDINGS:   PATIENT SURVEYS:  LEFS 30/80 (01/25/2024)  COGNITION: Overall cognitive status: Within functional limits for tasks assessed     SENSATION:   EDEMA:    POSTURE: forward flexed, decreased B hip extension  PALPATION:   LOWER EXTREMITY ROM:  Passive ROM Right eval Left eval  Hip flexion    Hip extension    Hip abduction    Hip adduction    Hip internal rotation    Hip external rotation    Knee flexion    Knee extension    Ankle dorsiflexion    Ankle plantarflexion    Ankle inversion    Ankle eversion     (Blank rows = not tested)  LOWER EXTREMITY MMT:  MMT Right eval Left eval  Hip flexion 4 4  Hip extension (seated manually resisted) 4+ 4+  Hip abduction (seated manually resisted) 4- 3+  Hip adduction    Hip internal rotation    Hip external rotation    Knee flexion    Knee extension 5 4+  Ankle dorsiflexion 4+ 4  Ankle plantarflexion    Ankle inversion    Ankle eversion     (Blank rows = not tested)  LOWER EXTREMITY SPECIAL TESTS:    FUNCTIONAL TESTS:  5 times sit to stand: 13.16 seconds without B UE assist Timed up and go (TUG): 11.26 seconds, 9.79 seconds, 9.28 seconds without use of AD Dynamic Gait Index: 16   GAIT: Distance walked: 30 ft Assistive device utilized: Single point cane Level of assistance: Modified independence Comments: Antalgic, decreased stance L LE, SPC on R, decreased B hip extension, forward flexed                                                                                                                                 TREATMENT DATE: 04/11/24  Therapeutic Activity:         NuStep seat 6 level 3 x 5 minutes for LE's only to promote quadriceps strengthening.   Medium level difficulty reported.   Seated manually resisted hip extension, hip abductoin 1x each LE for each way  Sit <> stand 5 times without UE assist   Total gym   L single leg squat height 11.5 for 10x3 with eccentric lowering  Hooklying hip extension isometrics, leg straight L 10x5 seconds   S/L hip abduction L 10x3   Single leg dead lift with contralateral UE and toe assist   L 10x3  Side stepping with yellow band around knees 30 ft to the R and 30 ft to the L for 2 sets   Standing with B UE assist   Hip abduction   Yellow band around ankles   L 10x3   R 10x3  Forward step up onto first regular step with B contralateral UE assist     With yellow band resisting hip abduction/ER   L 10x2     Improved technique, movement at target joints, use of target muscles after mod verbal, visual, tactile cues.      PATIENT EDUCATION:  Education details: POC Person educated: Patient Education method: Explanation Education comprehension: verbalized understanding  HOME EXERCISE PROGRAM: Access Code: [email or web address] URL: [email or web address] Date: 02/01/2024 Prepared by: Suzzane Estes  Exercises - Standing Partial Lunge  - 1 x daily - 7 x weekly - 3 sets - 10 reps - Standing Single Leg Stance with Unilateral Counter Support  - 1 x daily - 7 x weekly - 3 sets - 10 reps - 5 seconds hold - Standing Gastroc Stretch on Step with Counter Support  - 3 x daily - 7 x weekly - 1 sets - 10 reps - 30 seconds hold  - Seated Long Arc Quad with Ankle Weight  - 1 x daily - 7 x weekly - 3 sets - 10-15 reps - 5 seconds hold  Seated leg press seat 5  L plate 5 for 16X0   Can perform at the [employer]  - Standing Theme park manager on  Step with Counter Support  - 3 x daily - 7 x weekly - 1 sets - 3 reps - 60 seconds hold - Sidelying Hip Abduction  - 1 x daily - 7 x weekly - 3 sets - 10 reps - Forward T with Counter Support  - 1 x daily - 7 x weekly - 3 sets - 10 reps   ASSESSMENT:  CLINICAL IMPRESSION:  Pt demonstrates improved B hip extension and abduction strength, balance, function, and ability to ambulate since initial evaluation. Pt has achieved all goals. Skilled physical therapy services discharged with pt continuing progress with her exercises at home.        OBJECTIVE IMPAIRMENTS: Abnormal gait, difficulty walking, decreased ROM, decreased strength, improper body mechanics, postural dysfunction, and pain.   ACTIVITY LIMITATIONS: carrying, lifting, standing, squatting, stairs, transfers, and locomotion level  PARTICIPATION LIMITATIONS:   PERSONAL FACTORS: Age, Fitness, Past/current experiences, and 3+ comorbidities: back pain, Dyspnea on exertion, HTN  are also affecting patient's functional outcome.   REHAB POTENTIAL: Fair    CLINICAL DECISION MAKING: Stable/uncomplicated  EVALUATION COMPLEXITY: Low   GOALS: Goals reviewed with patient? Yes  SHORT TERM GOALS: Target date: 02/10/2024 Pt will be independent with her initial HEP to improve strength, function, and ability to ambulate and perform standing tasks with less difficulty.  Baseline: Pt has not yet started her  initial HEP (01/25/2024); no questions, able to do her HEP (03/12/2024) Goal status: Met    LONG TERM GOALS: Target date: 04/13/2024  Pt will improve her Dynamic Gait Index (DGI) score to 19 or more as a demonstration of improved balance and decreased fall risk.   Baseline: DGI score 16 (01/25/2024); 19 (03/12/2024) Goal status: Met  2.  Pt will improve her LEFS score by at least 15 points as a demonstration of improved function.  Baseline: LEFS 30/80 (01/25/2024); 47/80 (03/12/2024) Goal status: MET  3.  Pt will improve B glute med and  max strength by at least 1/2 MMT grade to promote ability to ambulate and perform standing tasks with less difficulty.  Baseline:  MMT Right eval Left eval R (03/12/2024) L (03/12/2024) R (04/11/2024) L (04/11/2024)  Hip extension (seated manually resisted) 4+ 4+ 4+ 4+ 5 5  Hip abduction (seated manually resisted) 4- 3+ 4+ 4+ 4+ 4+   Goal status: MET  4.  Pt will improve her 5 times sit <> stand time to 10 seconds or less as a demonstration of improved functional LE strength  Baseline: 5 times sit to stand: 13.16 seconds without B UE assist (01/25/2024); 11.34 seconds (03/12/2024);  10.6 seconds (04/11/2024)  Goal status: MET   5.  Pt will be able to ambulate 500 ft or more without AD and no LOB to promote mobility.  Baseline: Pt currently ambulating with SPC (01/25/2024); independent ambulation 500 ft + (03/12/2024) Goal status: MET     PLAN:  PT FREQUENCY: 1-2x/week  PT DURATION: 4 weeks  PLANNED INTERVENTIONS: 97110-Therapeutic exercises, 97530- Therapeutic activity, 97112- Neuromuscular re-education, 97535- Self Care, 87564- Manual therapy, [phone]- Gait training, 97014- Electrical stimulation (unattended), [phone]- Ionotophoresis 4mg /ml Dexamethasone , Patient/Family education, Balance training, and Stair training  PLAN FOR NEXT SESSION: trunk and hip strengthening, gait, balance, manual techniques, modalities PRN  Thank you for your referral.  Suzzane Estes PT, DPT  Physical Therapist - [facility]  04/11/24, 4:18 PM

## 2024-04-16 ENCOUNTER — Ambulatory Visit: Payer: PPO

## 2024-04-16 DIAGNOSIS — D3132 Benign neoplasm of left choroid: Secondary | ICD-10-CM | POA: Diagnosis not present

## 2024-04-16 DIAGNOSIS — H2513 Age-related nuclear cataract, bilateral: Secondary | ICD-10-CM | POA: Diagnosis not present

## 2024-04-17 ENCOUNTER — Encounter (HOSPITAL_BASED_OUTPATIENT_CLINIC_OR_DEPARTMENT_OTHER): Payer: PPO | Admitting: Cardiology

## 2024-04-17 ENCOUNTER — Encounter (HOSPITAL_BASED_OUTPATIENT_CLINIC_OR_DEPARTMENT_OTHER): Payer: Self-pay

## 2024-04-18 ENCOUNTER — Telehealth: Payer: Self-pay

## 2024-04-18 ENCOUNTER — Ambulatory Visit: Payer: PPO

## 2024-04-18 NOTE — Telephone Encounter (Signed)
 Can you please call patient to advise on pricing for Evenity and Prolia.  CB# [phone].  Please advise. Thank you.

## 2024-04-19 NOTE — Telephone Encounter (Signed)
 Thank you :)

## 2024-04-25 ENCOUNTER — Encounter (HOSPITAL_COMMUNITY): Payer: Self-pay

## 2024-04-25 ENCOUNTER — Telehealth: Payer: Self-pay

## 2024-04-25 NOTE — Telephone Encounter (Addendum)
 LVM for pt to return call.   Called to confirm pharmacy.  Pt has Systems developer by [employer], [place]  on file but [facility]  . [facility].

## 2024-04-26 DIAGNOSIS — G4733 Obstructive sleep apnea (adult) (pediatric): Secondary | ICD-10-CM | POA: Diagnosis not present

## 2024-05-01 DIAGNOSIS — G4733 Obstructive sleep apnea (adult) (pediatric): Secondary | ICD-10-CM | POA: Diagnosis not present

## 2024-05-02 ENCOUNTER — Ambulatory Visit
Admission: RE | Admit: 2024-05-02 | Discharge: 2024-05-02 | Disposition: A | Discharge status: Home / Self Care | Source: Ambulatory Visit | Attending: Family Medicine | Admitting: Family Medicine

## 2024-05-02 DIAGNOSIS — Z1231 Encounter for screening mammogram for malignant neoplasm of breast: Secondary | ICD-10-CM | POA: Diagnosis not present

## 2024-05-11 ENCOUNTER — Telehealth: Payer: Self-pay

## 2024-05-11 ENCOUNTER — Telehealth: Payer: Self-pay | Admitting: Cardiology

## 2024-05-11 NOTE — Telephone Encounter (Signed)
 Pt would like a c/b regarding CPAP Titration order being reinstated so that she is able to reschedule. Please advise

## 2024-05-11 NOTE — Telephone Encounter (Signed)
 Called patient and LVM with the cost of her Evenity each month and to call me to let me know if she is interested   her cost is $503 monthly

## 2024-05-16 NOTE — Telephone Encounter (Signed)
 Prior Authorization for TITRATION sent to [employer] via web portal. Tracking Number . Suspended-Service Dates:05/16/2024 - 08/14/2024

## 2024-05-27 DIAGNOSIS — G4733 Obstructive sleep apnea (adult) (pediatric): Secondary | ICD-10-CM | POA: Diagnosis not present

## 2024-06-06 ENCOUNTER — Ambulatory Visit: Admitting: Medical

## 2024-06-06 ENCOUNTER — Ambulatory Visit: Payer: PPO | Admitting: Internal Medicine

## 2024-06-06 NOTE — Telephone Encounter (Signed)
**Note De-Identified Audi Wettstein Obfuscation** Per letter from [employer], this CPAP Titration PA has been approved from 05/16/24-08/14/24. Authorization #: [id number]  I called the pt but got no answer so I left a message on her VM (Ok per [facility]) advising her of the approval and I provided the [facility] phone number so the pt can call them to schedule her CPAP Titration. I also left my name and the office phone number in the message so she can call me back if she has any questions or concerns.

## 2024-06-07 ENCOUNTER — Ambulatory Visit: Admitting: Nurse Practitioner

## 2024-06-12 ENCOUNTER — Encounter: Payer: Self-pay | Admitting: Cardiology

## 2024-06-12 ENCOUNTER — Ambulatory Visit: Attending: Cardiology | Admitting: Cardiology

## 2024-06-12 VITALS — BP 116/64 | HR 62 | Ht 67.0 in | Wt 170.0 lb

## 2024-06-12 DIAGNOSIS — I700 Atherosclerosis of aorta: Secondary | ICD-10-CM | POA: Diagnosis not present

## 2024-06-12 DIAGNOSIS — I471 Supraventricular tachycardia, unspecified: Secondary | ICD-10-CM

## 2024-06-12 DIAGNOSIS — I10 Essential (primary) hypertension: Secondary | ICD-10-CM | POA: Diagnosis not present

## 2024-06-12 DIAGNOSIS — E782 Mixed hyperlipidemia: Secondary | ICD-10-CM

## 2024-06-12 DIAGNOSIS — G4733 Obstructive sleep apnea (adult) (pediatric): Secondary | ICD-10-CM

## 2024-06-12 NOTE — Patient Instructions (Signed)
 Medication Instructions:  Your physician recommends that you continue on your current medications as directed. Please refer to the Current Medication list given to you today.   *If you need a refill on your cardiac medications before your next appointment, please call your pharmacy*  Lab Work: No labs ordered today  If you have labs (blood work) drawn today and your tests are completely normal, you will receive your results only by: MyChart Message (if you have MyChart) OR A paper copy in the mail If you have any lab test that is abnormal or we need to change your treatment, we will call you to review the results.  Testing/Procedures: No test ordered today   Follow-Up: At [facility], you and your health needs are our priority.  As part of our continuing mission to provide you with exceptional heart care, our providers are all part of one team.  This team includes your primary Cardiologist (physician) and Advanced Practice Providers or APPs (Physician Assistants and Nurse Practitioners) who all work together to provide you with the care you need, when you need it.  Your next appointment:   12 month(s)  Provider:   Lonni Hanson, MD or Tylene Lunch, NP

## 2024-06-12 NOTE — Progress Notes (Signed)
 Cardiology Office Note   Date:  06/12/2024  ID:  Michelle Cisneros, DOB 24-Jul-1956, MRN [id number] PCP: Valora Agent, MD  [facility] Providers Cardiologist:  Lonni Hanson, MD     History of Present Illness Michelle Cisneros is a 68 y.o. female with a past medical history of PSVT, aortic atherosclerosis, hypertension, hyperlipidemia, palpitations, chronic back pain, thyroid  nodule, skin cancer, who presents today for follow-up.   Patient had previously undergone Lexiscan  Myoview (*05/05/2020) which revealed no ST segment deviation noted during stress, no T wave inversions, this study was normal.  Left ventricular ejection fraction was estimated 55 to 65%.  Echocardiogram completed revealed LVEF 60 to 65%, no RWMA, right ventricular function and size were normal, and there were no valvular abnormalities.  With her continued complaints of palpitations send she underwent a ZIO monitoring in 10/2021 which showed predominant sinus rhythm with an average heart rate of 73 bpm, occasional PACs and rare PVCs, numerous atrial runs lasting up to 33 seconds but no prolonged pauses.   She was last seen in clinic 05/29/2023 by Dr. Hanson.  She reports she been feeling well though she been under some stress.  She denies any palpitations, chest pain, shortness of breath, lightheadedness or edema.  Blood pressure was not well-controlled.  There were no medication changes that were made and further testing that was ordered at that time.  She returns to clinic today stating that from the cardiac perspective she overall has been doing well.  She denies any chest pain, shortness of breath, peripheral edema, or palpitations.  She states that in December she fell and ended up with a hip fracture and since undergoing surgery has noted an increase in her weight as she is unable to be as active as she was prior to.  She states that she also was started on a CPAP therapy brought in the chip from her machine today for  further evaluation as she was advised by her insurance that she had to have evaluation by her cardiologist felt that her CPAP machine would be covered.  She states that she has been compliant with her current medication regimen.  Denies any recent hospitalizations or visits to the emergency department.  ROS: 10 point review of systems has been reviewed and considered negative except ones been listed in the HPI  Studies Reviewed EKG Interpretation Date/Time:  Tuesday June 12 2024 10:45:33 EDT Ventricular Rate:  62 PR Interval:  182 QRS Duration:  84 QT Interval:  398 QTC Calculation: 403 R Axis:   42  Text Interpretation: Normal sinus rhythm Normal ECG When compared with ECG of 10-Dec-2023 05:45, No significant change since Confirmed by Gerard Frederick ([id number]) on 06/12/2024 11:02:16 AM    Event Monitor (Zio) 10/23/2021 The patient was monitored for 14 days. The predominant rhythm was sinus with an average rate of 73 bpm (range 47-134 bpm in sinus). There were occasional PAC's and rare PVC's. Numerous (869) atrial runs were observed, lasting up to 33 seconds with a maximum rate of 190 bpm. There were no prolonged pauses. Patient triggered events correspond to sinus rhythm, PAC's and PVC's.  Lexiscan  MPI 05/05/2020 There was no ST segment deviation noted during stress. No T wave inversion was noted during stress. The study is normal. The left ventricular ejection fraction is normal (55-65%).  2D echo 05/22/2020 1. Left ventricular ejection fraction, by estimation, is 60 to 65%. The  left ventricle has normal function. The left ventricle has no regional  wall motion abnormalities. Left ventricular  diastolic parameters were  normal.   2. Right ventricular systolic function is normal. The right ventricular  size is normal.   3. The mitral valve is grossly normal. No evidence of mitral valve  regurgitation.   4. The aortic valve was not well visualized. Aortic valve regurgitation  is not  visualized.    Risk Assessment/Calculations    Physical Exam VS:  BP 116/64   Pulse 62   Ht 5' 7 (1.702 m)   Wt 170 lb (77.1 kg)   SpO2 96%   BMI 26.63 kg/m        Wt Readings from Last 3 Encounters:  06/12/24 170 lb (77.1 kg)  04/09/24 164 lb (74.4 kg)  01/16/24 165 lb (74.8 kg)    GEN: Well nourished, well developed in no acute distress NECK: No JVD; No carotid bruits CARDIAC: RRR, no murmurs, rubs, gallops RESPIRATORY:  Clear to auscultation without rales, wheezing or rhonchi  ABDOMEN: Soft, non-tender, non-distended EXTREMITIES:  No edema; No deformity   ASSESSMENT AND PLAN PSVT with EKG today revealed sinus rhythm with a rate of 62 with no acute changes.  She has not had any further episodes of palpitations or to use her cardia mobile.  No additional testing or medication changes are needed at this time.  Primary hypertension with blood pressure today 116/64.  Blood pressure is well-controlled.  She has been continued on lisinopril  20 mg daily.  She is also been encouraged to continue to monitor blood pressure 1 to 2 hours postmedication administration as well.  Aortic atherosclerosis and hyperlipidemia she is continued on atorvastatin  10 mg daily with her last LDL of 96.  Slight uptick from the last year at 70.  Recommend keeping LDL less than 100 and ideally closer to 70 for continued primary prevention of coronary artery disease.  Obstructive sleep apnea on CPAP which she has been given the number today to call about titration studies.  She does complain of dry mouth and likely needs humidity added to her machine as well according to her machine she has worn her CPAP 21 out of 24 days for an average of 7.27 hours.  Will need ongoing follow-up with Dr. Shlomo for management of her sleep apnea.       Dispo: Patient to return to clinic to see MD/APP in 1 year or sooner if needed for further evaluation  Signed, Isay Perleberg, NP

## 2024-06-15 DIAGNOSIS — M48062 Spinal stenosis, lumbar region with neurogenic claudication: Secondary | ICD-10-CM | POA: Diagnosis not present

## 2024-06-15 DIAGNOSIS — M47816 Spondylosis without myelopathy or radiculopathy, lumbar region: Secondary | ICD-10-CM | POA: Diagnosis not present

## 2024-06-18 ENCOUNTER — Telehealth: Payer: Self-pay | Admitting: Cardiology

## 2024-06-18 NOTE — Telephone Encounter (Signed)
 Pt requesting cb to discuss why she needs to have another CPAP Titration done. Also has question regarding compliance and if we rec'vd records

## 2024-06-18 NOTE — Telephone Encounter (Signed)
 Will route this message to our sleep team for further management.

## 2024-06-25 ENCOUNTER — Telehealth: Payer: Self-pay | Admitting: Internal Medicine

## 2024-06-25 MED ORDER — LISINOPRIL 20 MG PO TABS: 20.0000 mg | ORAL_TABLET | Freq: Every day | ORAL | 0 refills | Status: DC

## 2024-06-25 MED ORDER — LISINOPRIL 20 MG PO TABS
20.0000 mg | ORAL_TABLET | Freq: Every day | ORAL | 3 refills | Status: DC
Start: 2024-06-25 — End: 2024-06-25

## 2024-06-25 NOTE — Telephone Encounter (Signed)
*  STAT* If patient is at the pharmacy, call can be transferred to refill team.   1. Which medications need to be refilled? (please list name of each medication and dose if known)  lisinopril  (ZESTRIL ) 20 MG tablet    2. Would you like to learn more about the convenience, safety, & potential cost savings by using the [facility]?     3. Are you open to using the [facility] (Type [facility].  ).   4. Which pharmacy/location (including street and city if local pharmacy) is [facility] to be sent to[facility]  [address], [place], [phone] store ID # [phone]   5. Do they need a 30 day or 90 day supply? Patient only wants a 7 day supply   Patient is out of town, please send to the above pharmacy.

## 2024-06-25 NOTE — Telephone Encounter (Signed)
 Pt's medication was sent to pt's pharmacy as requested. Confirmation received.

## 2024-06-26 DIAGNOSIS — Z8679 Personal history of other diseases of the circulatory system: Secondary | ICD-10-CM | POA: Diagnosis not present

## 2024-06-26 DIAGNOSIS — R4189 Other symptoms and signs involving cognitive functions and awareness: Secondary | ICD-10-CM | POA: Diagnosis not present

## 2024-06-26 DIAGNOSIS — Q039 Congenital hydrocephalus, unspecified: Secondary | ICD-10-CM | POA: Diagnosis not present

## 2024-06-26 DIAGNOSIS — G4733 Obstructive sleep apnea (adult) (pediatric): Secondary | ICD-10-CM | POA: Diagnosis not present

## 2024-06-26 DIAGNOSIS — M5136 Other intervertebral disc degeneration, lumbar region with discogenic back pain only: Secondary | ICD-10-CM | POA: Diagnosis not present

## 2024-06-26 DIAGNOSIS — G5601 Carpal tunnel syndrome, right upper limb: Secondary | ICD-10-CM | POA: Diagnosis not present

## 2024-07-02 NOTE — Telephone Encounter (Signed)
 Pt calling back. She states she has been on cpap for months but having to pay extra because she never had sleep compliance appt. She also states she keeps being told she needs a cpap titration but she is unsure why because she had a sleep study last year. Please advise. Below is fax number she provided for [employer]. She states they need ov notes for sleep compliance.   Fax: [phone]

## 2024-07-06 DIAGNOSIS — M48062 Spinal stenosis, lumbar region with neurogenic claudication: Secondary | ICD-10-CM | POA: Diagnosis not present

## 2024-07-06 DIAGNOSIS — M5416 Radiculopathy, lumbar region: Secondary | ICD-10-CM | POA: Diagnosis not present

## 2024-07-06 NOTE — Procedures (Signed)
 Patient was here today for extensive memory evaluation.  She was not accompanied by loved one.  SLUMS was performed and forms were sent with patient for loved one to fill out and bring back.    SLUMS - [employer] Mental Status Examination Level of education 12th grade Performed on 07/06/2024   1/1 1. What day of the week is it?  1/1 2. What is the year?  1/1 3. What state are we in?   4. Please remember these five objects. I will ask you what they are later.      Apple  Pen  Tie  House   Car  5. You have $100 and you go to the store and buy a dozen apples for $3 and a         tricycle for $20.       How much did you spend?  1/3      How much do you have left?   6. Please name as many animals as you can in one minute. 2/3     (0)-4 animals (1) 5-9 animals (2) 10-14 animals (3)15+ animals    7. What were the five objects I asked you to remember?  1/5      1 point for each one correct.  8. I am going to give you a series of numbers and I would like you to give them                 to me backwards. For example, if I say 42, you would say 24. 2/2     (0) 87 (1) 649  (2) 8537  9. This is a clock face. Please put in the hour markers and the time at ten  minutes to eleven o'clock.      (2) Hour markers okay 4/4     (2) Time correct   10. Please place an X in the triangle. 2/2       Which of the above figures is largest?  11. I am going to tell you a story. Please listen carefully because afterwards, I'm  going to ask you some questions about it. 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 She then met Marinell, a devastatingly handsome man. She married him and had three children. They lived in [place]. She then stopped work and stayed at home to bring up her children. When they were teenagers, she went back to work. She and Marinell lived happily ever after.      (2) What was the female's name?      (2)What work did she do? 8/8         (2)When did she go back to work?  (2)What state did she live in?  23/30 Total Score  This exam is reliable for alert patients, it can evaluate multiple neurocognitive domains including orientation, attention, concentration, memory, language, fund of knowledge etc.

## 2024-07-06 NOTE — Progress Notes (Signed)
 [facility] - Physiatry  PROCEDURE: 1. Bilateral L4-5 transforaminal epidural injections under fluoroscopic guidance. 2. Use of fluoroscopic guidance was required to ensure adequate delivery of medication into the epidural space and for proper needle placement. 3. The patient was monitored with continuous pulse oximetry throughout the procedures.   DIAGNOSIS/INDICATION FOR PROCEDURE: The patient is a pleasant 68 year female followed by [facility] for acute on chronic bilateral low back pain with radiation into the bilateral buttock.  Clinically she has some symptoms consistent with a lumbosacral radiculitis.  MRI from [facility] dated 07/16/2023 both images and report were reviewed today.  COMPARISON:  None Available.  FINDINGS:  Segmentation: Standard.  Alignment: 0.3 cm anterolisthesis L3 on L4 and 0.4 cm  anterolisthesis L4 on L5 is due to facet degenerative disease.  Vertebrae: No acute fracture, evidence of discitis, or bone lesion.  Remote superior endplate compression fractures of T12 and L1 are  present. Vertebral body height loss centrally at T12 is  approximately 40% and there is approximately 50% vertebral body  height loss anteriorly L1. Degenerative endplate signal change L5-S1  noted.  Conus medullaris and cauda equina: Conus extends to the L1 level.  Conus and cauda equina appear normal.  Paraspinal and other soft tissues: Negative.   Disc levels:  T11-12: Shallow disc bulge and ligamentum flavum thickening. No  stenosis.   T12-L1: There is a disc bulge, mild to moderate facet arthropathy  and ligamentum flavum thickening. Mild central canal narrowing is  present. There is also moderate bilateral foraminal narrowing.   L1-2: Disc bulge and endplate spur eccentric to the left. There is  moderate facet degenerative change. Mild narrowing is present in the  subarticular recesses. Moderate to moderately severe bilateral  foraminal narrowing  is also seen.   L2-3: Moderate facet arthropathy with a shallow disc bulge and  ligamentum flavum thickening. There is mild central canal narrowing.  Moderate to moderately severe bilateral foraminal narrowing is also  seen.   L3-4: Moderately severe bilateral facet degenerative change is  present. There is mild disc uncovering and a shallow bulge.  Ligamentum flavum thickening is seen. Moderately severe central  canal and bilateral subarticular recess narrowing is seen. There is  also moderate to moderately severe bilateral foraminal narrowing.   L4-5: Advanced bilateral facet degenerative change is present. There  is mild central canal stenosis and bilateral subarticular recess  narrowing. Mild to moderate foraminal narrowing is also seen.   L5-S1: Disc bulge and endplate spur eccentric to the right. There is  right greater than left subarticular recess and foraminal narrowing.   IMPRESSION:  1. Multilevel spondylosis appears worst at L3-4 where there is  moderately severe central canal and bilateral subarticular recess  narrowing. Moderate to moderately severe bilateral foraminal  narrowing is also seen at this level.  2. Mild central canal and bilateral subarticular recess narrowing at  L4-5. Mild to moderate foraminal narrowing is also seen at this  level.  3. Right greater than left subarticular recess and foraminal  narrowing at L5-S1.  4. Mild central canal and bilateral subarticular recess narrowing at  L1-2 and L2-3. Moderate to moderately severe bilateral foraminal  narrowing is also seen at L1-2 and L2-3.  5. Remote superior endplate compression fractures of T12 and L1.   Electronically Signed    By: Debby Prader M.D    On: 07/27/2023 10:17    Her pain reaches 8/10.  Procedures: 07/06/2024: Bilateral L4-5 transforaminal ESI 12/09/2023: Left femur  fracture and total hip 09/28/2023: RFA to the bilateral L3-4 and L4-5 facet joints (unsure of  relief) 08/31/2023: MBB to the bilateral L3-4 and L4-5 facet joints (8/10 to 0/10) 08/16/2023: MBB to the bilateral L3-4 and L4-5 facet joints (8/10 to 0/10)   IMPRESSION/RESULTS: Patient tolerated the procedure well without any complications.  We used a 3.5 inch needle in the right L4-5 transforaminal ESI.  We used a 23-gauge, 5 inch needle for the left L4-5 transforaminal ESI.  We also used 2% lidocaine .  Immediately after the procedure she reported 30% relief.  She will follow-up with [facility] in 3 to 4 weeks.   INFORMED CONSENT: The patient understood the potential risks and benefits of the procedure, which were explained to the patient prior to the procedure.  The patient read and signed the consent stating complete understanding of the information, and wished to proceed with the procedure, there were no barriers to understanding.  Ample time was given for any questions to be answered prior to the procedure. The risks of the procedure were explained including, but not limited to the risk of bleeding and/or infection into the spinal area, intervertebral discs, or epidural space; nerve injury; nerve irritation; reaction to medications; etc.  The patient denied any history of bleeding disorders, allergies to medications used or other medical contraindications to the procedure.  No promises were given to any expected outcome.     PROCEDURE: A time out was performed by physician and assistant.  Correct patient, procedure, plan of care, site, position, and equipment were verified.  Identical procedural technique was used for both epidural injections.  The patient was sterilely prepped with a triple scrub of betadine  solution and draped in the prone position.  Careful attention was paid to aseptic technique throughout the procedure.  Then, the Right L4-5 neuroforamen was identified under fluoroscopic guidance.  The overlying skin and subcutaneous tissues were anesthetized with approximately 2 cc of 1%  Xylocaine .  A 25 gauge spinal needle was inserted down into the foramen.  Approximately 2 cc of Omnipaque-240 mgI/mL contrast was infiltrated under real time-fluoroscopy demonstrating satisfactory spread along the exiting spinal nerve and into the epidural space without vascular uptake.  No aspirate was noted.  Spot films were taken.  A solution containing 0.5 cc of dexamethasone  sodium phosphate  10 mg/cc and 0.5 cc of 2% preservative-free lidocaine  was slowly infiltrated around the spinal nerve and into the epidural space under real-time fluoroscopy.  There was good contrast washout.  The needle was removed.   Then, the Left L4-5 neuroforamen was identified under fluoroscopic guidance.  The overlying skin and subcutaneous tissues were anesthetized with approximately 2 cc of 1% Xylocaine .  A 25 gauge spinal needle was inserted down into the foramen.  Approximately 2 cc of Omnipaque-240 mgI/mL contrast was infiltrated under real time-fluoroscopy demonstrating satisfactory spread along the exiting spinal nerve and into the epidural space without vascular uptake.  No aspirate was noted.  Spot films were taken.  A solution containing 0.5 cc of dexamethasone  sodium phosphate  10 mg/cc and 0.5 cc of 2% preservative-free lidocaine  was slowly infiltrated around the spinal nerve and into the epidural space under real-time fluoroscopy.  There was good contrast washout.  The needle was removed.  There were no complications.   DISCHARGE SUMMARY: The patient was monitored post-injection for 30 minutes in the recovery area and remained stable without evidence of complications.  The patient was discharged with discharge instructions in stable condition. Patient was instructed to contact us  with  any problems.  Procedure fluoro time: 31 seconds. Fluoroscopy dose: 17.81 mGy.  This is below the dose threshold #1 (5000 mGy).    BENJAMIN CHARLES CHASNIS, DO

## 2024-07-10 NOTE — Telephone Encounter (Signed)
 Pt requesting more details on sleep study

## 2024-07-17 NOTE — Telephone Encounter (Signed)
 Left message to call back

## 2024-07-19 NOTE — Telephone Encounter (Addendum)
 Patient called to say she got her cpap machine last year through [employer].  Encounter Date: 06/03/2023      Notified patient of sleep study results and recommendations.All questions were answered, patient verbalized understanding. Order for CPAP and supplies sent to [employer] today 09/15/23.         She has been trying to get a compliance appointment but keeps getting cancelled because the providers last note states she was suppose to have a cpap titration with a sleep aide. She has repeatedly cancelled the cpap titrations because she needs a compliance appointment.   I reached out to [employer] (Ale L) who informed me that [employer] needs the patients compliance visit notes to say she is using and benefiting from cpap therapy. Patient will be charged for the cpap rental and her supplies until [employer] receives the compliance notes.

## 2024-07-27 ENCOUNTER — Encounter (HOSPITAL_BASED_OUTPATIENT_CLINIC_OR_DEPARTMENT_OTHER): Payer: Self-pay | Admitting: *Deleted

## 2024-07-27 DIAGNOSIS — G4733 Obstructive sleep apnea (adult) (pediatric): Secondary | ICD-10-CM | POA: Diagnosis not present

## 2024-07-27 NOTE — Progress Notes (Signed)
 This encounter was created in error - please disregard.

## 2024-07-30 DIAGNOSIS — M48062 Spinal stenosis, lumbar region with neurogenic claudication: Secondary | ICD-10-CM | POA: Diagnosis not present

## 2024-07-30 DIAGNOSIS — M5416 Radiculopathy, lumbar region: Secondary | ICD-10-CM | POA: Diagnosis not present

## 2024-08-03 ENCOUNTER — Telehealth: Payer: Self-pay

## 2024-08-03 NOTE — Telephone Encounter (Signed)
 SABRA

## 2024-08-10 ENCOUNTER — Ambulatory Visit: Attending: Cardiology | Admitting: Cardiology

## 2024-08-10 VITALS — Ht 65.0 in | Wt 165.0 lb

## 2024-08-10 DIAGNOSIS — G4733 Obstructive sleep apnea (adult) (pediatric): Secondary | ICD-10-CM | POA: Diagnosis not present

## 2024-08-10 DIAGNOSIS — I10 Essential (primary) hypertension: Secondary | ICD-10-CM

## 2024-08-10 NOTE — Progress Notes (Signed)
 SLEEP MEDICINE VIRTUAL CONSULT NOTE via Video Note   Because of Michelle Cisneros's co-morbid illnesses, she is at least at moderate risk for complications without adequate follow up.  This format is felt to be most appropriate for this patient at this time.  All issues noted in this document were discussed and addressed.  A limited physical exam was performed with this format.  Please refer to the patient's chart for her consent to telehealth for [facility].    Date:  08/10/2024   ID:  Michelle Cisneros, DOB October 02, 1956, MRN [id number] The patient was identified using 2 identifiers.  Patient Location: Home Provider Location: Home Office   PCP:  Valora Agent, MD   [facility] Providers Cardiologist:  Lonni Hanson, MD     Evaluation Performed:  New Patient Evaluation  Chief Complaint:  OSA  History of Present Illness:    Michelle Cisneros is a 68 y.o. female who is being seen today for the evaluation of OSA at the request of Lonni Hanson, MD.  Michelle Cisneros is a 68 y.o. female with a hx of depression, SOB, HTN, HLD who complained to Dr. Hanson back in 2022 that she was having problems with poor sleep and increased sleepiness during the day and falling asleep in the evenings.  Stop Bang score was 4.  She underwent HST 11/2021 showing Moderate OSA with an AHI of 18.4/hr and O2 sat nadir of 72% and O2 sats <88% for 27 min c/w nocturnal hypoxemia. She underwent CPAP titration but due to ongoing events could not be titrated on CPAP. A BiPAP titration was recommended but she never followed through.   Due to how long it was from her initial sleep study, a repeat HST had to be done in order to proceed with BiPAP titration.  She underwent repeat HST on 06/03/2023 and was found again to have moderate OSA with an AHI of 26.9/hr and nocturnal hypoxemia with O2 sat nadir 69%.  CPAP titration was recommended. Ultimately was started on auto CPAP from 4 to 15cm H2O .   She is referred for sleep medicine consultation for treatment of OSA.  She is doing well with her PAP device. She tolerates the full face mask but it moves around a lot. She never had an in person mask fitting.  She feels the pressure is adequate.  Since going on PAP she feels rested in the am and has no significant daytime sleepiness if she slept well the night before.  She has a hard time falling asleep.  She denies any significant nasal dryness or nasal congestion.  She does have problems with mouth dryness.  She does not think that she snores. Patient denies any episodes of bruxism, restless legs, No gagging hallucinations or cataplectic events.     Past Medical History:  Diagnosis Date   Aortic atherosclerosis (HCC)    Arthritis    Back pain    Basal cell carcinoma    L forehead, txted in past by another provider   Collagen vascular disease (HCC)    Depression    DOE (dyspnea on exertion)    a. 05/2020 Echo: EF 60-65%, no rwma, nl RV size/fxn.   History of stress test    a. 04/2020 Lexiscan  MV: No ischemia/infarct.   Hyperlipemia    Hypertension    Melanoma (HCC) ~2015   R forearm txted in past by another provider   Recurrent UTI    Squamous cell carcinoma of skin  L med ankle, txted in past by another provider   Past Surgical History:  Procedure Laterality Date   ABDOMINAL HYSTERECTOMY     COLONOSCOPY WITH PROPOFOL  N/A 05/26/2020   Procedure: COLONOSCOPY WITH PROPOFOL ;  Surgeon: Toledo, Ladell POUR, MD;  Location: [facility] END[facility]COPY;  Service: Gastroenterology;  Laterality: N/A;   INCONTINENCE SURGERY     OOPHORECTOMY     TOTAL HIP ARTHROPLASTY Left 12/11/2023   Procedure: TOTAL HIP ARTHROPLASTY ANTERIOR APPROACH;  Surgeon: Jerri Kay HERO, MD;  Location: [facility] OR;  Service: Orthopedics;  Laterality: Left;     Current Meds  Medication Sig   atorvastatin  (LIPITOR) 10 MG tablet TAKE ONE TABLET BY MOUTH DAILY   buPROPion  (WELLBUTRIN  XL) 300 MG 24 hr tablet TAKE ONE TABLET BY MOUTH DAILY    Calcium  Carbonate-Vitamin D  600-400 MG-UNIT tablet Take 1 tablet by mouth daily.   fluticasone  (FLONASE ) 50 MCG/ACT nasal spray Place 1 spray into both nostrils daily as needed for allergies.   lisinopril  (ZESTRIL ) 20 MG tablet Take 1 tablet (20 mg total) by mouth daily.   Multiple Vitamin (MULTIVITAMIN WITH MINERALS) TABS tablet Take 1 tablet by mouth daily.   nitrofurantoin , macrocrystal-monohydrate, (MACROBID ) 100 MG capsule Take 1 capsule (100 mg total) by mouth daily.     Allergies:   Patient has no known allergies.   Social History   Tobacco Use   Smoking status: Former    Current packs/day: 0.00    Average packs/day: 1 pack/day for 30.0 years (30.0 ttl pk-yrs)    Types: Cigarettes    Start date: 10/03/1957    Quit date: 10/04/1987    Years since quitting: 36.8    Passive exposure: Past   Smokeless tobacco: Never  Vaping Use   Vaping status: Never Used  Substance Use Topics   Alcohol use: Yes    Comment: occassional; once glass of wine   Drug use: Not Currently     Family Hx: The patient's family history includes Atrial fibrillation in her father; Cancer in her father; Stroke in her mother. There is no history of Breast cancer, Bladder Cancer, or Kidney cancer.  ROS:   Please see the history of present illness.     All other systems reviewed and are negative.   Prior Sleep studies:   The following studies were reviewed today:  HST, CPAP titration, HST, PAP compliance download  Labs/Other Tests and Data Reviewed:     Recent Labs: 12/12/2023: ALT 41; Magnesium  1.9 12/13/2023: BUN 13; Creatinine, Ser 0.64; Hemoglobin 12.3; Platelets 211; Potassium 4.0; Sodium 133    Wt Readings from Last 3 Encounters:  08/10/24 165 lb (74.8 kg)  06/12/24 170 lb (77.1 kg)  04/09/24 164 lb (74.4 kg)    Objective:    Vital Signs:  Ht 5' 5 (1.651 m)   Wt 165 lb (74.8 kg)   BMI 27.46 kg/m    VITAL SIGNS:  reviewed GEN:  no acute distress EYES:  sclerae anicteric,  EOMI - Extraocular Movements Intact RESPIRATORY:  normal respiratory effort, symmetric expansion CARDIOVASCULAR:  no peripheral edema SKIN:  no rash, lesions or ulcers. MUSCULOSKELETAL:  no obvious deformities. NEURO:  alert and oriented x 3, no obvious focal deficit PSYCH:  normal affect  ASSESSMENT & PLAN:    OSA - The patient is tolerating PAP therapy well without any problems. The PAP download performed by his DME was personally reviewed and interpreted by me today and showed an AHI of 5.4/hr on auto CPAP from 4 to 15 cm  H2O with 87% compliance in using more than 4 hours nightly.  The patient has been using and benefiting from PAP use and will continue to benefit from therapy.  -she has a large mask leak on download -I am going to have her set up an appt with DME for a mask fitting (she never had this done) -I will get an ONO on CPAP to look for preristent nocturnal hypoxemia  HTN -BP controlled at home -continue Lisinopril  20mg  daily with PRN refills   Time:   Today, I have spent 15 minutes with the patient with telehealth technology discussing the above problems.     Medication Adjustments/Labs and Tests Ordered: Current medicines are reviewed at length with the patient today.  Concerns regarding medicines are outlined above.   Tests Ordered: No orders of the defined types were placed in this encounter.   Medication Changes: No orders of the defined types were placed in this encounter.   Follow Up:  In Person in 1 year(s)  Signed, Wilbert Bihari, MD  08/10/2024 8:16 AM    [facility]

## 2024-08-10 NOTE — Patient Instructions (Signed)

## 2024-08-13 ENCOUNTER — Telehealth: Payer: Self-pay | Admitting: *Deleted

## 2024-08-13 DIAGNOSIS — I10 Essential (primary) hypertension: Secondary | ICD-10-CM

## 2024-08-13 DIAGNOSIS — G4733 Obstructive sleep apnea (adult) (pediatric): Secondary | ICD-10-CM

## 2024-08-13 NOTE — Addendum Note (Signed)
 Addended by: JOSHUA DALTON MATSU on: 08/13/2024 01:14 PM   Modules accepted: Orders

## 2024-08-13 NOTE — Telephone Encounter (Signed)
 [facility]  Order placed to [employer] via community message.

## 2024-08-13 NOTE — Telephone Encounter (Signed)
-----   Message from Wilbert Bihari sent at 08/10/2024  8:17 AM EDT ----- Set up an appt with DME for a mask fitting.  She also needs an ONO on CPAP

## 2024-08-29 ENCOUNTER — Encounter: Payer: Self-pay | Admitting: Cardiology

## 2024-08-30 DIAGNOSIS — N390 Urinary tract infection, site not specified: Secondary | ICD-10-CM | POA: Diagnosis not present

## 2024-08-30 DIAGNOSIS — R399 Unspecified symptoms and signs involving the genitourinary system: Secondary | ICD-10-CM | POA: Diagnosis not present

## 2024-08-30 DIAGNOSIS — R319 Hematuria, unspecified: Secondary | ICD-10-CM | POA: Diagnosis not present

## 2024-08-30 NOTE — Telephone Encounter (Signed)
**Note De-Identified Chasmine Lender Obfuscation** I called the pt to get clarification on which testing she is referring to.  She stated that she has mailed the Overnight Pulse Oximetry back to the DME company.  I advised her per Dr Shlomo:  Shlomo Wilbert SAUNDERS, MD to Drena Martinis, Sharlet, RN  Janit Geni CROME, RN   08/30/24  9:29 AM I have not received results yet  She verbalized understanding and thanked me for my call.

## 2024-09-14 DIAGNOSIS — G4733 Obstructive sleep apnea (adult) (pediatric): Secondary | ICD-10-CM | POA: Diagnosis not present

## 2024-09-24 ENCOUNTER — Other Ambulatory Visit: Payer: Self-pay | Admitting: Cardiology

## 2024-09-24 DIAGNOSIS — G4733 Obstructive sleep apnea (adult) (pediatric): Secondary | ICD-10-CM | POA: Diagnosis not present

## 2024-09-26 MED ORDER — LISINOPRIL 20 MG PO TABS: 20.0000 mg | ORAL_TABLET | Freq: Every day | ORAL | 2 refills | Status: AC

## 2024-10-01 ENCOUNTER — Encounter: Payer: Self-pay | Admitting: Cardiology

## 2024-10-02 ENCOUNTER — Encounter: Payer: Self-pay | Admitting: Cardiology

## 2024-10-11 ENCOUNTER — Telehealth: Payer: Self-pay | Admitting: *Deleted

## 2024-10-11 NOTE — Telephone Encounter (Addendum)
 Patient notified via her mychart that there is no residual nocturnal hypoxemia on cpap and no indication for 02.

## 2024-10-22 ENCOUNTER — Encounter: Payer: Self-pay | Admitting: Dermatology

## 2024-10-22 ENCOUNTER — Ambulatory Visit: Payer: PPO | Admitting: Dermatology

## 2024-10-22 ENCOUNTER — Encounter: Payer: Self-pay | Admitting: Radiology

## 2024-10-22 DIAGNOSIS — L578 Other skin changes due to chronic exposure to nonionizing radiation: Secondary | ICD-10-CM

## 2024-10-22 DIAGNOSIS — D225 Melanocytic nevi of trunk: Secondary | ICD-10-CM

## 2024-10-22 DIAGNOSIS — L719 Rosacea, unspecified: Secondary | ICD-10-CM | POA: Diagnosis not present

## 2024-10-22 DIAGNOSIS — L570 Actinic keratosis: Secondary | ICD-10-CM

## 2024-10-22 DIAGNOSIS — Z85828 Personal history of other malignant neoplasm of skin: Secondary | ICD-10-CM | POA: Diagnosis not present

## 2024-10-22 DIAGNOSIS — L814 Other melanin hyperpigmentation: Secondary | ICD-10-CM

## 2024-10-22 DIAGNOSIS — L820 Inflamed seborrheic keratosis: Secondary | ICD-10-CM

## 2024-10-22 DIAGNOSIS — Z85820 Personal history of malignant melanoma of skin: Secondary | ICD-10-CM | POA: Diagnosis not present

## 2024-10-22 DIAGNOSIS — W908XXA Exposure to other nonionizing radiation, initial encounter: Secondary | ICD-10-CM | POA: Diagnosis not present

## 2024-10-22 DIAGNOSIS — L905 Scar conditions and fibrosis of skin: Secondary | ICD-10-CM | POA: Diagnosis not present

## 2024-10-22 DIAGNOSIS — D229 Melanocytic nevi, unspecified: Secondary | ICD-10-CM

## 2024-10-22 DIAGNOSIS — Z1283 Encounter for screening for malignant neoplasm of skin: Secondary | ICD-10-CM

## 2024-10-22 DIAGNOSIS — L821 Other seborrheic keratosis: Secondary | ICD-10-CM | POA: Diagnosis not present

## 2024-10-22 DIAGNOSIS — D1801 Hemangioma of skin and subcutaneous tissue: Secondary | ICD-10-CM

## 2024-10-22 NOTE — Progress Notes (Signed)
 Follow-Up Visit   Subjective  Michelle Cisneros is a 68 y.o. female who presents for the following: Skin Cancer Screening and Full Body Skin Exam hx of Melanoma, BCC, SCC, Aks, Rosacea face, Metronidazole  0.75% cr prn, check spot L preauricular, itchy prn, hx of wart L plantar foot at ball, also irritated spots at chest  The patient presents for Total-Body Skin Exam (TBSE) for skin cancer screening and mole check. The patient has spots, moles and lesions to be evaluated, some may be new or changing and the patient may have concern these could be cancer.    The following portions of the chart were reviewed this encounter and updated as appropriate: medications, allergies, medical history  Review of Systems:  No other skin or systemic complaints except as noted in HPI or Assessment and Plan.  Objective  Well appearing patient in no apparent distress; mood and affect are within normal limits.  A full examination was performed including scalp, head, eyes, ears, nose, lips, neck, chest, axillae, abdomen, back, buttocks, bilateral upper extremities, bilateral lower extremities, hands, feet, fingers, toes, fingernails, and toenails. All findings within normal limits unless otherwise noted below.   Relevant physical exam findings are noted in the Assessment and Plan.  L preauricular x 1, R chest at braline x 4, L chest x 1 (6) Stuck on waxy paps with erythema R temple x 1, L forehead at hairline x 1 (2) Pink scaly macules  Assessment & Plan   SKIN CANCER SCREENING PERFORMED TODAY.  ACTINIC DAMAGE chest - Chronic condition, secondary to cumulative UV/sun exposure - diffuse scaly erythematous macules with underlying dyspigmentation - Recommend daily broad spectrum sunscreen SPF 30+ to sun-exposed areas, reapply every 2 hours as needed.  - Staying in the shade or wearing long sleeves, sun glasses (UVA+UVB protection) and wide brim hats (4-inch brim around the entire circumference of the  hat) are also recommended for sun protection.  - Call for new or changing lesions.  LENTIGINES, SEBORRHEIC KERATOSES, HEMANGIOMAS - Benign normal skin lesions - Benign-appearing - Call for any changes  MELANOCYTIC NEVI - Tan-brown and/or pink-flesh-colored symmetric macules and papules - R lower back 5x 2 mm med light brown macule - L medial buttocks 8 mm med light brown macule - Benign appearing on exam today, Stable. - Observation - Call clinic for new or changing moles - Recommend daily use of broad spectrum spf 30+ sunscreen to sun-exposed areas.    HISTORY OF MELANOMA - No evidence of recurrence today- R forearm - Recommend regular full body skin exams - Recommend daily broad spectrum sunscreen SPF 30+ to sun-exposed areas, reapply every 2 hours as needed.  - Call if any new or changing lesions are noted between office visits    HISTORY OF SQUAMOUS CELL CARCINOMA OF THE SKIN - No evidence of recurrence today- L med ankle - Recommend regular full body skin exams - Recommend daily broad spectrum sunscreen SPF 30+ to sun-exposed areas, reapply every 2 hours as needed.  - Call if any new or changing lesions are noted between office visits   HISTORY OF BASAL CELL CARCINOMA OF THE SKIN - No evidence of recurrence today- L forehead - Recommend regular full body skin exams - Recommend daily broad spectrum sunscreen SPF 30+ to sun-exposed areas, reapply every 2 hours as needed.  - Call if any new or changing lesions are noted between office visits    ROSACEA Exam Mid face erythema with telangiectasias  Chronic and persistent condition with duration  or expected duration over one year. Condition is improving with treatment but not currently at goal.   Rosacea is a chronic progressive skin condition usually affecting the face of adults, causing redness and/or acne bumps. It is treatable but not curable. It sometimes affects the eyes (ocular rosacea) as well. It may respond to  topical and/or systemic medication and can flare with stress, sun exposure, alcohol, exercise, topical steroids (including hydrocortisone/cortisone 10) and some foods.  Daily application of broad spectrum spf 30+ sunscreen to face is recommended to reduce flares.  Treatment Plan Cont Metronidazole  0.75% cr prn flares (pt will call when she needs refills) Recommend SPF qd  SCAR L pretibia Exam: Dyspigmented smooth macule or patch.  Benign-appearing.  Observation.  Call clinic for new or changing lesions. Recommend daily broad spectrum sunscreen SPF 30+, reapply every 2 hours as needed. Scars remodel on their own for a full year and will gradually improve in appearance over time.  INFLAMED SEBORRHEIC KERATOSIS (6) L preauricular x 1, R chest at braline x 4, L chest x 1 (6) Symptomatic, irritating, patient would like treated. Destruction of lesion - L preauricular x 1, R chest at braline x 4, L chest x 1 (6)  Destruction method: cryotherapy   Informed consent: discussed and consent obtained   Lesion destroyed using liquid nitrogen: Yes   Region frozen until ice ball extended beyond lesion: Yes   Outcome: patient tolerated procedure well with no complications   Post-procedure details: wound care instructions given   Additional details:  Prior to procedure, discussed risks of blister formation, small wound, skin dyspigmentation, or rare scar following cryotherapy. Recommend Vaseline ointment to treated areas while healing.   AK (ACTINIC KERATOSIS) (2) R temple x 1, L forehead at hairline x 1 (2) Actinic keratoses are precancerous spots that appear secondary to cumulative UV radiation exposure/sun exposure over time. They are chronic with expected duration over 1 year. A portion of actinic keratoses will progress to squamous cell carcinoma of the skin. It is not possible to reliably predict which spots will progress to skin cancer and so treatment is recommended to prevent development of skin  cancer.  Recommend daily broad spectrum sunscreen SPF 30+ to sun-exposed areas, reapply every 2 hours as needed.  Recommend staying in the shade or wearing long sleeves, sun glasses (UVA+UVB protection) and wide brim hats (4-inch brim around the entire circumference of the hat). Call for new or changing lesions. Destruction of lesion - R temple x 1, L forehead at hairline x 1 (2)  Destruction method: cryotherapy   Informed consent: discussed and consent obtained   Lesion destroyed using liquid nitrogen: Yes   Region frozen until ice ball extended beyond lesion: Yes   Outcome: patient tolerated procedure well with no complications   Post-procedure details: wound care instructions given   Additional details:  Prior to procedure, discussed risks of blister formation, small wound, skin dyspigmentation, or rare scar following cryotherapy. Recommend Vaseline ointment to treated areas while healing.   Return in about 1 year (around 10/22/2025) for TBSE, Hx of Melanoma, Hx of BCC, Hx of SCC, Hx of AKs.  I, Grayce Saunas, RMA, am acting as scribe for Rexene Rattler, MD .   Documentation: I have reviewed the above documentation for accuracy and completeness, and I agree with the above.  Rexene Rattler, MD

## 2024-10-22 NOTE — Patient Instructions (Addendum)

## 2024-11-13 ENCOUNTER — Other Ambulatory Visit (HOSPITAL_COMMUNITY): Payer: Self-pay

## 2025-04-15 ENCOUNTER — Ambulatory Visit: Admitting: Urology

## 2025-10-22 ENCOUNTER — Encounter: Admitting: Dermatology
